# Patient Record
Sex: Male | Born: 1970 | Race: Black or African American | Hispanic: No | Marital: Married | Smoking: Former smoker
Health system: Southern US, Community
[De-identification: ages and names within clinical notes are randomized; demographics above are authoritative.]

## PROBLEM LIST (undated history)

## (undated) DIAGNOSIS — E119 Type 2 diabetes mellitus without complications: Secondary | ICD-10-CM

## (undated) DIAGNOSIS — L0390 Cellulitis, unspecified: Secondary | ICD-10-CM

## (undated) DIAGNOSIS — Z8614 Personal history of Methicillin resistant Staphylococcus aureus infection: Secondary | ICD-10-CM

## (undated) HISTORY — PX: OTHER SURGICAL HISTORY: SHX169

## (undated) HISTORY — PX: KNEE SURGERY: SHX244

## (undated) HISTORY — PX: APPENDECTOMY: SHX54

---

## 2003-06-30 ENCOUNTER — Emergency Department (HOSPITAL_COMMUNITY): Admission: EM | Admit: 2003-06-30 | Discharge: 2003-06-30 | Payer: Self-pay | Admitting: Emergency Medicine

## 2003-10-22 ENCOUNTER — Emergency Department (HOSPITAL_COMMUNITY): Admission: EM | Admit: 2003-10-22 | Discharge: 2003-10-22 | Payer: Self-pay

## 2004-04-04 ENCOUNTER — Emergency Department (HOSPITAL_COMMUNITY): Admission: EM | Admit: 2004-04-04 | Discharge: 2004-04-04 | Payer: Self-pay | Admitting: Emergency Medicine

## 2004-05-18 ENCOUNTER — Emergency Department (HOSPITAL_COMMUNITY): Admission: EM | Admit: 2004-05-18 | Discharge: 2004-05-18 | Payer: Self-pay | Admitting: Emergency Medicine

## 2005-06-02 ENCOUNTER — Emergency Department (HOSPITAL_COMMUNITY): Admission: EM | Admit: 2005-06-02 | Discharge: 2005-06-02 | Payer: Self-pay | Admitting: Emergency Medicine

## 2005-07-12 ENCOUNTER — Emergency Department (HOSPITAL_COMMUNITY): Admission: EM | Admit: 2005-07-12 | Discharge: 2005-07-12 | Payer: Self-pay | Admitting: *Deleted

## 2005-09-02 ENCOUNTER — Emergency Department (HOSPITAL_COMMUNITY): Admission: EM | Admit: 2005-09-02 | Discharge: 2005-09-02 | Payer: Self-pay | Admitting: Emergency Medicine

## 2005-09-05 ENCOUNTER — Emergency Department (HOSPITAL_COMMUNITY): Admission: EM | Admit: 2005-09-05 | Discharge: 2005-09-05 | Payer: Self-pay | Admitting: *Deleted

## 2005-12-17 ENCOUNTER — Emergency Department (HOSPITAL_COMMUNITY): Admission: EM | Admit: 2005-12-17 | Discharge: 2005-12-17 | Payer: Self-pay | Admitting: Emergency Medicine

## 2006-01-29 ENCOUNTER — Emergency Department (HOSPITAL_COMMUNITY): Admission: EM | Admit: 2006-01-29 | Discharge: 2006-01-29 | Payer: Self-pay | Admitting: Emergency Medicine

## 2006-02-21 ENCOUNTER — Emergency Department (HOSPITAL_COMMUNITY): Admission: EM | Admit: 2006-02-21 | Discharge: 2006-02-21 | Payer: Self-pay | Admitting: Emergency Medicine

## 2006-02-21 ENCOUNTER — Emergency Department (HOSPITAL_COMMUNITY): Admission: EM | Admit: 2006-02-21 | Discharge: 2006-02-22 | Payer: Self-pay | Admitting: Emergency Medicine

## 2006-02-22 ENCOUNTER — Emergency Department (HOSPITAL_COMMUNITY): Admission: EM | Admit: 2006-02-22 | Discharge: 2006-02-22 | Payer: Self-pay | Admitting: Emergency Medicine

## 2006-02-23 ENCOUNTER — Encounter: Payer: Self-pay | Admitting: Internal Medicine

## 2006-02-23 ENCOUNTER — Inpatient Hospital Stay (HOSPITAL_COMMUNITY): Admission: EM | Admit: 2006-02-23 | Discharge: 2006-02-26 | Payer: Self-pay | Admitting: Emergency Medicine

## 2006-02-23 ENCOUNTER — Ambulatory Visit: Payer: Self-pay | Admitting: Internal Medicine

## 2008-07-06 ENCOUNTER — Emergency Department (HOSPITAL_COMMUNITY): Admission: EM | Admit: 2008-07-06 | Discharge: 2008-07-06 | Payer: Self-pay | Admitting: Emergency Medicine

## 2010-07-19 ENCOUNTER — Emergency Department (HOSPITAL_COMMUNITY)
Admission: EM | Admit: 2010-07-19 | Discharge: 2010-07-20 | Payer: Self-pay | Source: Home / Self Care | Admitting: Emergency Medicine

## 2010-07-24 ENCOUNTER — Emergency Department (HOSPITAL_COMMUNITY)
Admission: EM | Admit: 2010-07-24 | Discharge: 2010-07-24 | Payer: Self-pay | Source: Home / Self Care | Admitting: Emergency Medicine

## 2010-07-24 LAB — URINE CULTURE
Colony Count: NO GROWTH
Culture  Setup Time: 201201120424
Culture: NO GROWTH

## 2010-07-24 LAB — DIFFERENTIAL
Basophils Absolute: 0 10*3/uL (ref 0.0–0.1)
Basophils Relative: 0 % (ref 0–1)
Eosinophils Absolute: 0.1 10*3/uL (ref 0.0–0.7)
Eosinophils Relative: 1 % (ref 0–5)
Lymphocytes Relative: 30 % (ref 12–46)
Lymphs Abs: 2.6 10*3/uL (ref 0.7–4.0)
Monocytes Absolute: 0.6 10*3/uL (ref 0.1–1.0)
Monocytes Relative: 7 % (ref 3–12)
Neutro Abs: 5.2 10*3/uL (ref 1.7–7.7)
Neutrophils Relative %: 61 % (ref 43–77)

## 2010-07-24 LAB — HEPATIC FUNCTION PANEL
ALT: 36 U/L (ref 0–53)
AST: 34 U/L (ref 0–37)
Albumin: 3.7 g/dL (ref 3.5–5.2)
Alkaline Phosphatase: 90 U/L (ref 39–117)
Bilirubin, Direct: 0.1 mg/dL (ref 0.0–0.3)
Indirect Bilirubin: 0.4 mg/dL (ref 0.3–0.9)
Total Bilirubin: 0.5 mg/dL (ref 0.3–1.2)
Total Protein: 7.9 g/dL (ref 6.0–8.3)

## 2010-07-24 LAB — URINALYSIS, ROUTINE W REFLEX MICROSCOPIC
Bilirubin Urine: NEGATIVE
Hgb urine dipstick: NEGATIVE
Leukocytes, UA: NEGATIVE
Nitrite: NEGATIVE
Protein, ur: 30 mg/dL — AB
Specific Gravity, Urine: 1.036 — ABNORMAL HIGH (ref 1.005–1.030)
Urine Glucose, Fasting: NEGATIVE mg/dL
Urobilinogen, UA: 1 mg/dL (ref 0.0–1.0)
pH: 6 (ref 5.0–8.0)

## 2010-07-24 LAB — URINE MICROSCOPIC-ADD ON

## 2010-07-24 LAB — BASIC METABOLIC PANEL
BUN: 10 mg/dL (ref 6–23)
CO2: 26 mEq/L (ref 19–32)
Calcium: 9.4 mg/dL (ref 8.4–10.5)
Chloride: 100 mEq/L (ref 96–112)
Creatinine, Ser: 0.93 mg/dL (ref 0.4–1.5)
GFR calc Af Amer: >60 mL/min (ref >60)
GFR calc non Af Amer: >60 mL/min (ref >60)
Glucose, Bld: 114 mg/dL — ABNORMAL HIGH (ref 70–99)
Potassium: 3.9 mEq/L (ref 3.5–5.1)
Sodium: 139 mEq/L (ref 135–145)

## 2010-07-24 LAB — PROTIME-INR
INR: 1.02 (ref 0.00–1.49)
Prothrombin Time: 13.6 seconds (ref 11.6–15.2)

## 2010-07-24 LAB — LIPASE, BLOOD: Lipase: 32 U/L (ref 11–59)

## 2010-07-24 LAB — APTT: aPTT: 29 seconds (ref 24–37)

## 2010-07-24 LAB — AMMONIA: Ammonia: 46 umol/L — ABNORMAL HIGH (ref 11–35)

## 2010-07-24 LAB — CBC
HCT: 45.4 % (ref 39.0–52.0)
Hemoglobin: 15.8 g/dL (ref 13.0–17.0)
MCH: 31.9 pg (ref 26.0–34.0)
MCHC: 34.8 g/dL (ref 30.0–36.0)
MCV: 91.5 fL (ref 78.0–100.0)
Platelets: 156 10*3/uL (ref 150–400)
RBC: 4.96 MIL/uL (ref 4.22–5.81)
RDW: 12.5 % (ref 11.5–15.5)
WBC: 8.6 10*3/uL (ref 4.0–10.5)

## 2010-07-31 LAB — WOUND CULTURE

## 2010-07-31 LAB — CHLAMYDIA TRACHOMATIS CULTURE

## 2010-07-31 LAB — GONOCOCCUS CULTURE: Culture: NO GROWTH

## 2010-11-24 NOTE — Discharge Summary (Signed)
NAMEJAVEN, Logan Weiss                 ACCOUNT NO.:  [id number]   MEDICAL RECORD NO.:  [id number]          PATIENT TYPE:  INP   LOCATION:  5016                         FACILITY:  [facility]   PHYSICIAN:  Duncan Dull, M.D.     DATE OF BIRTH:  10-28-1970   DATE OF ADMISSION:  02/23/2006  DATE OF DISCHARGE:  02/26/2006                                 DISCHARGE SUMMARY   ATTENDING PHYSICIAN:  Duncan Dull, M.D.   ADMISSION DATE:  February 23, 2006   DISCHARGE DATE:  February 26, 2006   PRIMARY CARE PHYSICIAN:  None.   DISCHARGE DIAGNOSES:  1. Methicillin sensitive staph aureus cellulitis of the upper lip.  2. Severe periodontal disease.  3. 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  4. Hypokalemia-resolved.  5. Tobacco abuse.  6. Ethanol abuse.  7. Mildly elevated transaminases.   DISCHARGE MEDICATIONS:  1. Cephalexin 500 mg p.o. t.i.d. x10 days.  2. Chlorhexidine 0.12% oral rinse 15 mL swish and spit b.i.d.  3. Bactroban ointment apply to both nares b.i.d. x5 days.  4. Multi vitamin 1 tab p.o. daily.   DISPOSITION AND FOLLOWUP:  At the time of discharge, Logan Weiss lip was  significantly improved.  It was no longer swollen and the previous area of  mild drainage had completely resolved and was covered with a small eschar.  The patient remained afebrile throughout his admission and his white blood  cell count had returned to normal prior to discharge.  The patient is  scheduled to follow up with Dr. Orvan Falconer at the [facility]  Disease clinic on March 12, 2006 at 3:45 p.m.  At that time, his upper  lip will be reassessed.  Additionally, because Logan Weiss has had recurrent  staph infections in the past, a plan for staph prophylaxis and overall  hygiene may be discussed.  Logan Weiss also was strongly encouraged to find a  dentist because of his severe periodontal disease.  Finally, since the  patient was found to have mildly  elevated liver enzymes at the time of  admission, a repeat complete metabolic panel should be performed when he  returns for follow up.   PROCEDURES PERFORMED:  1. Orthopantogram:  This study was performed on February 23, 2006 to      evaluate for potential dental abscesses.  Extensive dental decay and      periodontal disease was noted but no sign of interosseus abscess was      found.  2. CT of the head and maxillofacial areas with and without contrast:  This      study was performed on February 23, 2006 to evaluate for involvement of      deep tissues secondary to the patient's cellulitis.  The study revealed      a normal appearing brain without any focal lesions.  No venous      thrombosis was noted.  Examination of the facial tissues revealed      inflammatory change of the soft tissues of the face, more pronounced on  the left than the right.  No focal fluid collections suggestive of a      drainable abscess were noted.  There was no involvement of the deep      tissues or bony structures.   CONSULTATIONS:  None.   BRIEF HISTORY AND PHYSICAL:  Logan Weiss is a 40 year old man with a history  of recurrent staph aureus skin infections for the last 5 years.  Most  recently, he was treated in July for a MRSA infection on his forehead.  Five  days prior to this admission, the patient began noticing a small pimple on  his upper lip which gradually increased in size.  He was seen at urgent care  where he was started on empiric therapy with Bactrim.  However, over the  following days, this pimple became significantly larger and was associated  with significant pain and swelling of his lip and nose.  On the day prior to  admission, a wound culture was attempted following lancing of the infected  area and was sent to the lab.  The following day, his symptoms had worsened  and he was admitted for IV drug therapy.   PHYSICAL EXAMINATION:  VITAL SIGNS:  Temperature 98.8, blood pressure   129/86, pulse 72, respirations 12, oxygen saturation 98% on room air.  GENERAL:  Physical exam was notable for a well appearing gentleman in no  acute distress.  His pupils were equal, round and reactive to light and  accommodation, extraocular eye movements were intact.  HEENT:  Exam revealed a clear oropharynx.  However, the upper lip was  markedly swollen with an area of purulent drainage along the filtrum just  along the upper lip.  This area was tender with some induration but no frank  abscess was noted.  Examination of the internal aspect of the lip revealed  numerous small pustules bordering the patient's teeth on the internal buccal  mucosa.  Overall, the patient's teeth appeared to be in very poor repair.  NECK:  Examination revealed no lymphadenopathy.  LUNGS:  Clear to auscultation bilaterally.  CARDIOVASCULAR:  Exam revealed a regular rate and rhythm without murmurs,  rubs or gallops.  ABDOMINAL:  Exam was benign.  NEUROLOGICAL:  Exam was grossly non focal.  Cranial nerves 2-12 were intact.   ADMISSION LABS:  White blood cell count 11.0, hemoglobin 15.6, hematocrit  45.5, platelets 180, ANC 8.2, MCV 92.5, sodium 139, potassium 3.4, chloride  99, bi carb 28, BUN 4, creatinine 1.1, glucose 139, total bilirubin 0.9,  direct bilirubin 0.2, indirect bilirubin 0.7, alkaline phosphatase 139, AST  38, ALT 44, total protein 9.2, albumin 3.7.   HOSPITAL COURSE:  1. Cellulitis of the upper lip:  At the time of presentation, the patient      appeared to have marked swelling of the upper lip with an area of      minimal purulent discharge.  Because a wound culture of this site had      been performed the previous day at urgent care, repeat samples were not      obtained.  However, blood cultures were sent to the lab and revealed no      growth after 4 days.  The wound culture revealed methicillin sensitive      staph aureus which was sensitive to several drugs including Avelox,      Keflex and Clindamycin.  Initially the patient was started on empiric      antibiotic therapy, given his history of MRSA,  which included      Vancomycin and Unasyn.  This was continued for 2 days and was then      transitioned to p.o. Avelox when the bacterial sensitivities were      known.  The patient received 2 doses of Avelox.  The imaging studies      noted above revealed primarily an infection of the superficial soft      tissues of the face.  Although severe periodontal disease was noted, no      obvious maxillary abscesses were found.  However, because of the      location of this infection and the risk of cavernous sinus thrombosis,      the patient was monitored closely.  At no point did he have any focal      neurological findings suggesting extension of the infection into the      central nervous system.  At the time of discharge the patient's white      count had fallen to 7.5.  He remained afebrile throughout the admission      and the swelling of his upper lip had essentially completely resolved.      Additionally he was not having any pain at the time of discharge.  He      was given a prescription for Cephalexin 500 mg p.o. t.i.d. to be taken      for 10 days.  This decision was discussed with Dr. Orvan Falconer who will be      seeing the patient for follow up on March 12, 2006.  It was agreed      upon that given the bacterial sensitivities, starting Cephalexin would      have fewer side effects than continuing a 10 day course of Avelox.  2. Severe periodontal disease:  As noted above in the physical exam and      imaging studies, the patient was found to have severe periodontal      disease.  He reports that he has not seen a dentist for many years and      is well aware of his poor dentition.  It is quite likely that his      current infection was at least to some degree due to poor oral hygiene.      While in the hospital, he was started on Chlorhexidine oral rinses and       was provided with a prescription to continue this at home twice a day.      The patient was also strongly encouraged to find a dentist in order to      have his dental disease evaluated and treated more thoroughly.  3. History of recurrent staph infections:  On admission, the patient      reported having skin boils caused by staph aureus for approximately 5      years.  In fact, he had already made an appointment to see Dr. Orvan Falconer      in the infectious disease clinic for this.  Given his history of a      previous MRSA infection, he was initially treated here for MRSA.      However because the pathogen causing his lip cellulitis on this      admission was found to be penicillin sensitive, as noted above, he was      discharged on Cephalexin.  The utility of prolonged antibiotic therapy      to prophylax against recurrent infections is questionable.  I will  defer this to Dr. Orvan Falconer and have already discussed the current     treatment plans with him.  The patient was strongly encouraged to use      an antibacterial soap such as Dial or Lever 2000 daily.  Furthermore,      he was given a prescription for Bactroban ointment to be used in his      nares twice a day for 5 days in order to eradicate any staph bacteria      that may have colonized this area.  4. Hypokalemia:  On admission, the patient was found to be mildly      hypokalemic with a potassium of 3.4.  He received oral potassium      repletion, and his potassium improved quickly thereafter.  However on      the morning of discharge his potassium had again fallen to 3.5 and he      received a single dose of potassium chloride 40 mEq by mouth before      being discharged.  In the future it may be worthwhile to periodically      monitor his electrolytes to insure that he does not become hypokalemic.  5. Elevated liver transaminases and alkaline phosphatase:  On admission      the patient was found to have mildly elevated  AST, ALT and alkaline      phosphatase.  He did not complain of any abdominal pain and his      abdominal exam was completely benign without any hepatosplenomegaly.      This likely represented a transient elevation of liver enzymes, which      could have been due to recent Bactrim therapy.  However a repeat      complete metabolic panel would be advised when he returns to the clinic      for follow up.  6. Tobacco and alcohol abuse:  The patient reports a long smoking history,      although he has recently cut back significantly.  Additionally he      reports that he has consumed considerable amounts of alcohol on the      weekends for some time.  When confronted with the CAGE questions, he      answered positively to 1 out of 4.  However he was strongly encouraged      to limit his alcohol use and to stop smoking.  The patient agreed with      this and was eager to stop using tobacco products.  He was also advised      to take a daily multivitamin.   DISCHARGE LABS:  White blood cell count 7.5, hemoglobin 13.8, hematocrit  39.7, platelets 210, sodium 138, potassium 3.5, chloride 105, bi carb 28,  BUN 8, creatinine 1.0, glucose 113, calcium 9.1.  Blood cultures revealed no  growth to date.  Nasopharyngeal cultures revealed no growth to date after 1  day as well.   DISCHARGE VITALS:  Temperature 96.9, pulse 74, blood pressure 143/81,  respirations 20, oxygen saturation 98% on room air.      Yvonne Kendall, M.D.  Electronically Signed      Duncan Dull, M.D.  Electronically Signed    CE/MEDQ  D:  02/26/2006  T:  02/27/2006  Job:  [id number]   cc:   Cliffton Asters, M.D.  Duncan Dull, M.D.

## 2014-06-05 ENCOUNTER — Ambulatory Visit (INDEPENDENT_AMBULATORY_CARE_PROVIDER_SITE_OTHER): Payer: Self-pay | Admitting: Family Medicine

## 2014-06-05 VITALS — BP 130/72 | HR 79 | Temp 99.2°F | Resp 18 | Ht 66.5 in | Wt 209.2 lb

## 2014-06-05 DIAGNOSIS — Z131 Encounter for screening for diabetes mellitus: Secondary | ICD-10-CM

## 2014-06-05 DIAGNOSIS — L03012 Cellulitis of left finger: Secondary | ICD-10-CM

## 2014-06-05 DIAGNOSIS — Z8614 Personal history of Methicillin resistant Staphylococcus aureus infection: Secondary | ICD-10-CM

## 2014-06-05 DIAGNOSIS — L03119 Cellulitis of unspecified part of limb: Secondary | ICD-10-CM

## 2014-06-05 DIAGNOSIS — E11620 Type 2 diabetes mellitus with diabetic dermatitis: Secondary | ICD-10-CM

## 2014-06-05 LAB — COMPLETE METABOLIC PANEL WITH GFR
ALT: 34 U/L (ref 0–53)
AST: 21 U/L (ref 0–37)
Albumin: 4.2 g/dL (ref 3.5–5.2)
Alkaline Phosphatase: 90 U/L (ref 39–117)
BUN: 7 mg/dL (ref 6–23)
CO2: 23 mEq/L (ref 19–32)
Calcium: 9.1 mg/dL (ref 8.4–10.5)
Chloride: 101 mEq/L (ref 96–112)
Creat: 0.78 mg/dL (ref 0.50–1.35)
GFR, Est African American: >89 mL/min
GFR, Est Non African American: >89 mL/min
Glucose, Bld: 137 mg/dL — ABNORMAL HIGH (ref 70–99)
Potassium: 3.9 mEq/L (ref 3.5–5.3)
Sodium: 138 mEq/L (ref 135–145)
Total Bilirubin: 0.4 mg/dL (ref 0.2–1.2)
Total Protein: 7.8 g/dL (ref 6.0–8.3)

## 2014-06-05 LAB — POCT GLYCOSYLATED HEMOGLOBIN (HGB A1C): Hemoglobin A1C: 6.9

## 2014-06-05 LAB — POCT CBC
Granulocyte percent: 63.6 %G (ref 37–80)
HCT, POC: 47.5 % (ref 43.5–53.7)
Hemoglobin: 15.2 g/dL (ref 14.1–18.1)
Lymph, poc: 2.7 (ref 0.6–3.4)
MCH, POC: 29.5 pg (ref 27–31.2)
MCHC: 32 g/dL (ref 31.8–35.4)
MCV: 92 fL (ref 80–97)
MID (cbc): 0.9 (ref 0–0.9)
MPV: 7.1 fL (ref 0–99.8)
POC Granulocyte: 6.3 (ref 2–6.9)
POC LYMPH PERCENT: 27.5 %L (ref 10–50)
POC MID %: 8.9 % (ref 0–12)
Platelet Count, POC: 134 10*3/uL — AB (ref 142–424)
RBC: 5.16 M/uL (ref 4.69–6.13)
RDW, POC: 14.2 %
WBC: 9.9 10*3/uL (ref 4.6–10.2)

## 2014-06-05 MED ORDER — CEFTRIAXONE SODIUM 1 G IJ SOLR
1.0000 g | Freq: Once | INTRAMUSCULAR | Status: AC
  Administered 2014-06-05: 1 g via INTRAMUSCULAR

## 2014-06-05 MED ORDER — HYDROCODONE-ACETAMINOPHEN 5-325 MG PO TABS: 1.0000 | ORAL_TABLET | Freq: Four times a day (QID) | ORAL | Status: DC | PRN

## 2014-06-05 MED ORDER — METFORMIN HCL 500 MG PO TABS: 500.0000 mg | ORAL_TABLET | Freq: Two times a day (BID) | ORAL | Status: DC

## 2014-06-05 MED ORDER — SULFAMETHOXAZOLE-TRIMETHOPRIM 800-160 MG PO TABS: 1.0000 | ORAL_TABLET | Freq: Two times a day (BID) | ORAL | Status: DC

## 2014-06-05 NOTE — Patient Instructions (Signed)
Cellulitis Cellulitis is an infection of the skin and the tissue beneath it. The infected area is usually red and tender. Cellulitis occurs most often in the arms and lower legs.  CAUSES  Cellulitis is caused by bacteria that enter the skin through cracks or cuts in the skin. The most common types of bacteria that cause cellulitis are staphylococci and streptococci. SIGNS AND SYMPTOMS   Redness and warmth.  Swelling.  Tenderness or pain.  Fever. DIAGNOSIS  Your health care provider can usually determine what is wrong based on a physical exam. Blood tests may also be done. TREATMENT  Treatment usually involves taking an antibiotic medicine. HOME CARE INSTRUCTIONS   Take your antibiotic medicine as directed by your health care provider. Finish the antibiotic even if you start to feel better.  Keep the infected arm or leg elevated to reduce swelling.  Apply a warm cloth to the affected area up to 4 times per day to relieve pain.  Take medicines only as directed by your health care provider.  Keep all follow-up visits as directed by your health care provider. SEEK MEDICAL CARE IF:   You notice red streaks coming from the infected area.  Your red area gets larger or turns dark in color.  Your bone or joint underneath the infected area becomes painful after the skin has healed.  Your infection returns in the same area or another area.  You notice a swollen bump in the infected area.  You develop new symptoms.  You have a fever. SEEK IMMEDIATE MEDICAL CARE IF:   You feel very sleepy.  You develop vomiting or diarrhea.  You have a general ill feeling (malaise) with muscle aches and pains. MAKE SURE YOU:   Understand these instructions.  Will watch your condition.  Will get help right away if you are not doing well or get worse. Document Released: 04/04/2005 Document Revised: 11/09/2013 Document Reviewed: 09/10/2011 [facility] Patient Information 2015 [place], [place].  This information is not intended to replace advice given to you by your health care provider. Make sure you discuss any questions you have with your health care provider. Diabetes Mellitus and Food It is important for you to manage your blood sugar (glucose) level. Your blood glucose level can be greatly affected by what you eat. Eating healthier foods in the appropriate amounts throughout the day at about the same time each day will help you control your blood glucose level. It can also help slow or prevent worsening of your diabetes mellitus. Healthy eating may even help you improve the level of your blood pressure and reach or maintain a healthy weight.  HOW CAN FOOD AFFECT ME? Carbohydrates Carbohydrates affect your blood glucose level more than any other type of food. Your dietitian will help you determine how many carbohydrates to eat at each meal and teach you how to count carbohydrates. Counting carbohydrates is important to keep your blood glucose at a healthy level, especially if you are using insulin or taking certain medicines for diabetes mellitus. Alcohol Alcohol can cause sudden decreases in blood glucose (hypoglycemia), especially if you use insulin or take certain medicines for diabetes mellitus. Hypoglycemia can be a life-threatening condition. Symptoms of hypoglycemia (sleepiness, dizziness, and disorientation) are similar to symptoms of having too much alcohol.  If your health care provider has given you approval to drink alcohol, do so in moderation and use the following guidelines:  Women should not have more than one drink per day, and men should not have more  than two drinks per day. One drink is equal to:  12 oz of beer.  5 oz of wine.  1 oz of hard liquor.  Do not drink on an empty stomach.  Keep yourself hydrated. Have water, diet soda, or unsweetened iced tea.  Regular soda, juice, and other mixers might contain a lot of carbohydrates and should be counted. WHAT  FOODS ARE NOT RECOMMENDED? As you make food choices, it is important to remember that all foods are not the same. Some foods have fewer nutrients per serving than other foods, even though they might have the same number of calories or carbohydrates. It is difficult to get your body what it needs when you eat foods with fewer nutrients. Examples of foods that you should avoid that are high in calories and carbohydrates but low in nutrients include:  Trans fats (most processed foods list trans fats on the Nutrition Facts label).  Regular soda.  Juice.  Candy.  Sweets, such as cake, pie, doughnuts, and cookies.  Fried foods. WHAT FOODS CAN I EAT? Have nutrient-rich foods, which will nourish your body and keep you healthy. The food you should eat also will depend on several factors, including:  The calories you need.  The medicines you take.  Your weight.  Your blood glucose level.  Your blood pressure level.  Your cholesterol level. You also should eat a variety of foods, including:  Protein, such as meat, poultry, fish, tofu, nuts, and seeds (lean animal proteins are best).  Fruits.  Vegetables.  Dairy products, such as milk, cheese, and yogurt (low fat is best).  Breads, grains, pasta, cereal, rice, and beans.  Fats such as olive oil, trans fat-free margarine, canola oil, avocado, and olives. DOES EVERYONE WITH DIABETES MELLITUS HAVE THE SAME MEAL PLAN? Because every person with diabetes mellitus is different, there is not one meal plan that works for everyone. It is very important that you meet with a dietitian who will help you create a meal plan that is just right for you. Document Released: 03/22/2005 Document Revised: 06/30/2013 Document Reviewed: 05/22/2013 [facility] Patient Information 2015 [place], [place]. This information is not intended to replace advice given to you by your health care provider. Make sure you discuss any questions you have with your health care  provider. Diabetes and Standards of Medical Care Diabetes is complicated. You may find that your diabetes team includes a dietitian, nurse, diabetes educator, eye doctor, and more. To help everyone know what is going on and to help you get the care you deserve, the following schedule of care was developed to help keep you on track. Below are the tests, exams, vaccines, medicines, education, and plans you will need. HbA1c test This test shows how well you have controlled your glucose over the past 2-3 months. It is used to see if your diabetes management plan needs to be adjusted.   It is performed at least 2 times a year if you are meeting treatment goals.  It is performed 4 times a year if therapy has changed or if you are not meeting treatment goals. Blood pressure test  This test is performed at every routine medical visit. The goal is less than 140/90 mm Hg for most people, but 130/80 mm Hg in some cases. Ask your health care provider about your goal. Dental exam  Follow up with the dentist regularly. Eye exam  If you are diagnosed with type 1 diabetes as a child, get an exam upon reaching the age  of 10 years or older and have had diabetes for 3-5 years. Yearly eye exams are recommended after that initial eye exam.  If you are diagnosed with type 1 diabetes as an adult, get an exam within 5 years of diagnosis and then yearly.  If you are diagnosed with type 2 diabetes, get an exam as soon as possible after the diagnosis and then yearly. Foot care exam  Visual foot exams are performed at every routine medical visit. The exams check for cuts, injuries, or other problems with the feet.  A comprehensive foot exam should be done yearly. This includes visual inspection as well as assessing foot pulses and testing for loss of sensation.  Check your feet nightly for cuts, injuries, or other problems with your feet. Tell your health care provider if anything is not healing. Kidney function  test (urine microalbumin)  This test is performed once a year.  Type 1 diabetes: The first test is performed 5 years after diagnosis.  Type 2 diabetes: The first test is performed at the time of diagnosis.  A serum creatinine and estimated glomerular filtration rate (eGFR) test is done once a year to assess the level of chronic kidney disease (CKD), if present. Lipid profile (cholesterol, HDL, LDL, triglycerides)  Performed every 5 years for most people.  The goal for LDL is less than 100 mg/dL. If you are at high risk, the goal is less than 70 mg/dL.  The goal for HDL is 40 mg/dL-50 mg/dL for men and 50 mg/dL-60 mg/dL for women. An HDL cholesterol of 60 mg/dL or higher gives some protection against heart disease.  The goal for triglycerides is less than 150 mg/dL. Influenza vaccine, pneumococcal vaccine, and hepatitis B vaccine  The influenza vaccine is recommended yearly.  It is recommended that people with diabetes who are over 72 years old get the pneumonia vaccine. In some cases, two separate shots may be given. Ask your health care provider if your pneumonia vaccination is up to date.  The hepatitis B vaccine is also recommended for adults with diabetes. Diabetes self-management education  Education is recommended at diagnosis and ongoing as needed. Treatment plan  Your treatment plan is reviewed at every medical visit. Document Released: 04/22/2009 Document Revised: 11/09/2013 Document Reviewed: 11/25/2012 [facility] Patient Information 2015 [place], [place]. This information is not intended to replace advice given to you by your health care provider. Make sure you discuss any questions you have with your health care provider.

## 2014-06-05 NOTE — Progress Notes (Signed)
Chief Complaint:  Chief Complaint  Patient presents with  . Swollen Hand    Left    HPI: Logan Weiss is a 43 y.o. male who is here for  3 week hx of left hand pain and swelling, started off as small area of pain and swelling, it was Tmax was 102 yesterday and has been taking tylenol, ibuprofen without releif. NKI, he is not diabetic. He works at [employer]. 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 HE did not make too much of it since there were no rashes or open wounds. He has a hx of MRSA--not sure if he ahs a rx to doxycyline, know he has been on it and was not effective an dhad to be on IV abs, he has taken bactrim before without issues, he has no kidney issues. Currenlty no illicit drugs.   History reviewed. No pertinent past medical history. History reviewed. No pertinent past surgical history. History   Social History  . Marital Status: Single    Spouse Name: N/A    Number of Children: N/A  . Years of Education: N/A   Social History Main Topics  . Smoking status: Current Every Day Smoker  . Smokeless tobacco: Never Used  . Alcohol Use: No  . Drug Use: No  . Sexual Activity: None   Other Topics Concern  . None   Social History Narrative  . None   Family History  Problem Relation Age of Onset  . Hyperlipidemia Mother   . Mental retardation Mother   . Diabetes Father   . Hyperlipidemia Father    No Known Allergies Prior to Admission medications   Not on File     ROS: The patient denies night sweats, unintentional weight loss, chest pain, palpitations, wheezing, dyspnea on exertion, nausea, vomiting, abdominal pain, dysuria, hematuria, melena, numbness, weakness, or tingling.   All other systems have been reviewed and were otherwise negative with the exception of those mentioned in the HPI and as above.    PHYSICAL EXAM: Filed Vitals:   06/05/14 1048  BP: 130/72  Pulse: 79  Temp: 99.2 F (37.3 C)  Resp: 18   Filed  Vitals:   06/05/14 1048  Height: 5' 6.5" (1.689 m)  Weight: 209 lb 4 oz (94.915 kg)   Body mass index is 33.27 kg/(m^2).  General: Alert, no acute distress HEENT:  Normocephalic, atraumatic, oropharynx patent. EOMI, PERRLA Cardiovascular:  Regular rate and rhythm, no rubs murmurs or gallops.  Radial pulse intact. No pedal edema.  Respiratory: Clear to auscultation bilaterally.  No wheezes, rales, or rhonchi.  No cyanosis, no use of accessory musculature GI: No organomegaly, abdomen is soft and non-tender, positive bowel sounds.  No masses. Skin: + cellulitis and swelling, he has good grip but hurts for him to grip, minimal warmth, no lesions, cracks or puncture wounds Neurologic: Facial musculature symmetric. Psychiatric: Patient is appropriate throughout our interaction. Lymphatic: No cervical lymphadenopathy Musculoskeletal: Gait intact.   LABS: Results for orders placed or performed in visit on 06/05/14  COMPLETE METABOLIC PANEL WITH GFR  Result Value Ref Range   Sodium 138 135 - 145 mEq/L   Potassium 3.9 3.5 - 5.3 mEq/L   Chloride 101 96 - 112 mEq/L   CO2 23 19 - 32 mEq/L   Glucose, Bld 137 (H) 70 - 99 mg/dL   BUN 7 6 - 23 mg/dL   Creat 9.140.78 7.820.50 - 9.561.35 mg/dL  Total Bilirubin 0.4 0.2 - 1.2 mg/dL   Alkaline Phosphatase 90 39 - 117 U/L   AST 21 0 - 37 U/L   ALT 34 0 - 53 U/L   Total Protein 7.8 6.0 - 8.3 g/dL   Albumin 4.2 3.5 - 5.2 g/dL   Calcium 9.1 8.4 - 40.910.5 mg/dL   GFR, Est African American >89 mL/min   GFR, Est Non African American >89 mL/min  POCT CBC  Result Value Ref Range   WBC 9.9 4.6 - 10.2 K/uL   Lymph, poc 2.7 0.6 - 3.4   POC LYMPH PERCENT 27.5 10 - 50 %L   MID (cbc) 0.9 0 - 0.9   POC MID % 8.9 0 - 12 %M   POC Granulocyte 6.3 2 - 6.9   Granulocyte percent 63.6 37 - 80 %G   RBC 5.16 4.69 - 6.13 M/uL   Hemoglobin 15.2 14.1 - 18.1 g/dL   HCT, POC 81.147.5 91.443.5 - 53.7 %   MCV 92.0 80 - 97 fL   MCH, POC 29.5 27 - 31.2 pg   MCHC 32.0 31.8 - 35.4 g/dL    RDW, POC 78.214.2 %   Platelet Count, POC 134 (A) 142 - 424 K/uL   MPV 7.1 0 - 99.8 fL  POCT glycosylated hemoglobin (Hb A1C)  Result Value Ref Range   Hemoglobin A1C 6.9      EKG/XRAY:   [facility]   ASSESSMENT/PLAN: Encounter Diagnoses  Name Primary?  . Cellulitis of finger of left hand Yes  . Cellulitis of hand   . Screening for diabetes mellitus   . Type 2 diabetes mellitus with diabetic dermatitis   . History of MRSA infection     43 y/o AA male with a hx of MRSA in the past, newly dx diabetes.  He was given a rocephin IM injection and also Bactrim DS Norco for pain control  He states he has had both and the doxyccyline did not work for him, I suspect it was too far into the infection for the oral pills to work so he required IV abx in the hospital HE will be started on metformin 500 mg BID  CMP pending F/u in 1 month, precautions given to go to ER prn for wrosening sxs  Gross sideeffects, risk and benefits, and alternatives of medications d/w patient. Patient is aware that all medications have potential sideeffects and we are unable to predict every sideeffect or drug-drug interaction that may occur.  Hamilton CapriLE, Annslee Tercero PHUONG, DO 06/08/2014 12:37 PM

## 2014-06-08 DIAGNOSIS — Z8614 Personal history of Methicillin resistant Staphylococcus aureus infection: Secondary | ICD-10-CM | POA: Insufficient documentation

## 2014-06-08 DIAGNOSIS — E11620 Type 2 diabetes mellitus with diabetic dermatitis: Secondary | ICD-10-CM | POA: Insufficient documentation

## 2014-06-09 ENCOUNTER — Telehealth: Payer: Self-pay

## 2014-06-09 NOTE — Telephone Encounter (Signed)
Pt of Dr. Conley RollsLe needs a Dr. Note extended for his swollen hand, please advise

## 2014-06-10 NOTE — Telephone Encounter (Signed)
Patient requesting a modified OOW note through next Wednesday.    [phone]

## 2014-06-14 NOTE — Telephone Encounter (Signed)
LM for pt to rtn call and give us an update.  Letter has been written and in pick up drawer. Pt has been advised it is ready.

## 2014-06-14 NOTE — Telephone Encounter (Signed)
Extension is ok but is his hand better. That is what I am more worried about. IF not improved on abx thenhe needs to [facility]

## 2014-06-14 NOTE — Telephone Encounter (Signed)
Dr. Conley RollsLe please advise on this OOW extension.

## 2014-06-15 ENCOUNTER — Encounter: Payer: Self-pay | Admitting: Family Medicine

## 2014-06-22 ENCOUNTER — Encounter: Payer: Self-pay | Admitting: Internal Medicine

## 2014-06-22 ENCOUNTER — Encounter (HOSPITAL_COMMUNITY): Payer: Self-pay | Admitting: *Deleted

## 2014-06-22 ENCOUNTER — Ambulatory Visit: Payer: Self-pay | Attending: Internal Medicine | Admitting: Internal Medicine

## 2014-06-22 ENCOUNTER — Emergency Department (HOSPITAL_COMMUNITY)
Admission: EM | Admit: 2014-06-22 | Discharge: 2014-06-23 | Disposition: A | Discharge status: Home / Self Care | Payer: Self-pay | Attending: Emergency Medicine | Admitting: Emergency Medicine

## 2014-06-22 ENCOUNTER — Emergency Department (HOSPITAL_COMMUNITY): Payer: Self-pay

## 2014-06-22 VITALS — BP 134/82 | HR 101 | Temp 98.0°F | Resp 16 | Wt 209.0 lb

## 2014-06-22 DIAGNOSIS — Z79899 Other long term (current) drug therapy: Secondary | ICD-10-CM | POA: Insufficient documentation

## 2014-06-22 DIAGNOSIS — L03114 Cellulitis of left upper limb: Secondary | ICD-10-CM | POA: Insufficient documentation

## 2014-06-22 DIAGNOSIS — Z8249 Family history of ischemic heart disease and other diseases of the circulatory system: Secondary | ICD-10-CM | POA: Insufficient documentation

## 2014-06-22 DIAGNOSIS — M7989 Other specified soft tissue disorders: Secondary | ICD-10-CM | POA: Insufficient documentation

## 2014-06-22 DIAGNOSIS — Z87891 Personal history of nicotine dependence: Secondary | ICD-10-CM | POA: Insufficient documentation

## 2014-06-22 DIAGNOSIS — Z8614 Personal history of Methicillin resistant Staphylococcus aureus infection: Secondary | ICD-10-CM | POA: Insufficient documentation

## 2014-06-22 DIAGNOSIS — E139 Other specified diabetes mellitus without complications: Secondary | ICD-10-CM | POA: Insufficient documentation

## 2014-06-22 DIAGNOSIS — R609 Edema, unspecified: Secondary | ICD-10-CM

## 2014-06-22 DIAGNOSIS — L03012 Cellulitis of left finger: Secondary | ICD-10-CM

## 2014-06-22 DIAGNOSIS — E119 Type 2 diabetes mellitus without complications: Secondary | ICD-10-CM | POA: Insufficient documentation

## 2014-06-22 DIAGNOSIS — Z833 Family history of diabetes mellitus: Secondary | ICD-10-CM | POA: Insufficient documentation

## 2014-06-22 HISTORY — DX: Type 2 diabetes mellitus without complications: E11.9

## 2014-06-22 LAB — CBC WITH DIFFERENTIAL/PLATELET
Basophils Absolute: 0 10*3/uL (ref 0.0–0.1)
Basophils Relative: 1 % (ref 0–1)
Eosinophils Absolute: 0.2 10*3/uL (ref 0.0–0.7)
Eosinophils Relative: 4 % (ref 0–5)
HCT: 44.1 % (ref 39.0–52.0)
Hemoglobin: 14.7 g/dL (ref 13.0–17.0)
LYMPHS ABS: 2.9 10*3/uL (ref 0.7–4.0)
LYMPHS PCT: 47 % — AB (ref 12–46)
MCH: 29.4 pg (ref 26.0–34.0)
MCHC: 33.3 g/dL (ref 30.0–36.0)
MCV: 88.2 fL (ref 78.0–100.0)
Monocytes Absolute: 0.3 10*3/uL (ref 0.1–1.0)
Monocytes Relative: 4 % (ref 3–12)
NEUTROS ABS: 2.7 10*3/uL (ref 1.7–7.7)
NEUTROS PCT: 44 % (ref 43–77)
PLATELETS: 185 10*3/uL (ref 150–400)
RBC: 5 MIL/uL (ref 4.22–5.81)
RDW: 12.6 % (ref 11.5–15.5)
WBC: 6.1 10*3/uL (ref 4.0–10.5)

## 2014-06-22 LAB — BASIC METABOLIC PANEL
ANION GAP: 16 — AB (ref 5–15)
BUN: 8 mg/dL (ref 6–23)
CALCIUM: 9.6 mg/dL (ref 8.4–10.5)
CO2: 24 mEq/L (ref 19–32)
Chloride: 102 mEq/L (ref 96–112)
Creatinine, Ser: 0.73 mg/dL (ref 0.50–1.35)
Glucose, Bld: 79 mg/dL (ref 70–99)
POTASSIUM: 3.6 meq/L — AB (ref 3.7–5.3)
Sodium: 142 mEq/L (ref 137–147)

## 2014-06-22 LAB — URIC ACID: URIC ACID, SERUM: 5 mg/dL (ref 4.0–7.8)

## 2014-06-22 LAB — C-REACTIVE PROTEIN

## 2014-06-22 LAB — GLUCOSE, POCT (MANUAL RESULT ENTRY): POC Glucose: 111 mg/dl — AB (ref 70–99)

## 2014-06-22 LAB — SEDIMENTATION RATE: SED RATE: 10 mm/h (ref 0–16)

## 2014-06-22 MED ORDER — FREESTYLE SYSTEM KIT: 1.0000 | PACK | Status: DC | PRN

## 2014-06-22 MED ORDER — OXYCODONE-ACETAMINOPHEN 5-325 MG PO TABS
1.0000 | ORAL_TABLET | Freq: Once | ORAL | Status: DC
  Filled 2014-06-22: qty 1

## 2014-06-22 MED ORDER — BETAMETHASONE SOD PHOS & ACET 6 (3-3) MG/ML IJ SUSP
12.0000 mg | Freq: Once | INTRAMUSCULAR | Status: DC
  Filled 2014-06-22 (×3): qty 2

## 2014-06-22 MED ORDER — LIDOCAINE HCL (PF) 1 % IJ SOLN
5.0000 mL | Freq: Once | INTRAMUSCULAR | Status: AC
Start: 2014-06-22 — End: 2014-06-22
  Administered 2014-06-22: 5 mL via INTRADERMAL
  Filled 2014-06-22: qty 5

## 2014-06-22 MED ORDER — INDOMETHACIN ER 75 MG PO CPCR
75.0000 mg | ORAL_CAPSULE | Freq: Once | ORAL | Status: AC
  Administered 2014-06-23: 75 mg via ORAL
  Filled 2014-06-22: qty 1

## 2014-06-22 MED ORDER — GLUCOCOM LANCETS 28G MISC: Status: AC

## 2014-06-22 MED ORDER — KETOROLAC TROMETHAMINE 60 MG/2ML IM SOLN
30.0000 mg | Freq: Once | INTRAMUSCULAR | Status: AC
  Administered 2014-06-22: 30 mg via INTRAMUSCULAR
  Filled 2014-06-22: qty 2

## 2014-06-22 MED ORDER — SULFAMETHOXAZOLE-TRIMETHOPRIM 800-160 MG PO TABS
1.0000 | ORAL_TABLET | Freq: Once | ORAL | Status: AC
  Administered 2014-06-23: 1 via ORAL
  Filled 2014-06-22: qty 1

## 2014-06-22 MED ORDER — GLUCOSE BLOOD VI STRP: ORAL_STRIP | Status: AC

## 2014-06-22 NOTE — ED Provider Notes (Signed)
CSN: [id number]     Arrival date & time 06/22/14  1228 History   First MD Initiated Contact with Patient 06/22/14 1530     Chief Complaint  Patient presents with  . Hand Pain    Logan Weiss is a 43 y.o. male with a history of diabetes and MRSA infection presents to emergency complaining of one month of left index finger swelling and pain. Patient's finger is swollen from his knuckle to his fingertip. Denies trauma to his finger or hand. The patient reports about a month ago noticed swelling in his index finger that increased to involve his entire hand. Patient was started on bactrim and his hand swelling improved but his finger swelling continued. After completing the course of Bactrim he continued finger swelling. Patient reports that 6 days ago he was reevaluated and started on doxycycline. He has noticed no improvement since starting doxycycline. Patient rates his pain at 5 out of 10 at its worse with movement. Patient has been taking Norco with minimal relief. 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 Patient reports his diabetes has been well controlled. Patient denies redness, drainage or pus. Patient denies fevers, chills, numbness, tingling, abdominal pain, nausea, vomiting, rashes, chest pain or shortness of breath.  (Consider location/radiation/quality/duration/timing/severity/associated sxs/prior Treatment) HPI  Past Medical History  Diagnosis Date  . Diabetes     new dx   Past Surgical History  Procedure Laterality Date  . Appendectomy    . Right knee surgery     Family History  Problem Relation Age of Onset  . Hyperlipidemia Mother   . Mental retardation Mother   . Diabetes Mother   . Diabetes Father   . Hyperlipidemia Father   . Hypertension Father   . Diabetes Maternal Grandmother   . Diabetes Maternal Grandfather   . Diabetes Paternal Grandmother   . Diabetes Paternal Grandfather    History  Substance Use Topics  . Smoking status: Former Smoker  -- 0.00 packs/day for 5 years  . Smokeless tobacco: Never Used  . Alcohol Use: No     Comment: socially    Review of Systems  Constitutional: Negative for fever, chills and appetite change.  HENT: Negative for congestion, ear pain, sore throat and trouble swallowing.   Eyes: Negative for visual disturbance.  Respiratory: Negative for cough, shortness of breath and wheezing.   Cardiovascular: Negative for chest pain, palpitations and leg swelling.  Gastrointestinal: Negative for nausea, vomiting, abdominal pain and diarrhea.  Genitourinary: Negative for dysuria.  Musculoskeletal: Positive for joint swelling. Negative for back pain and neck pain.  Skin: Positive for color change. Negative for rash and wound.  Neurological: Negative for weakness, numbness and headaches.  All other systems reviewed and are negative.     Allergies  Review of patient's allergies indicates no known allergies.  Home Medications   Prior to Admission medications   Medication Sig Start Date End Date Taking? Authorizing Provider  doxycycline (VIBRA-TABS) 100 MG tablet Take 100 mg by mouth 2 (two) times daily.   Yes Historical Provider, MD  furosemide (LASIX) 20 MG tablet Take 20 mg by mouth daily.   Yes Historical Provider, MD  GlucoCom Lancets MISC Check blood sugar TID & QHS 06/22/14  Yes Deepak Advani, MD  glucose blood (CHOICE DM FORA G20 TEST STRIPS) test strip Use as instructed 06/22/14  Yes Deepak Advani, MD  glucose monitoring kit (FREESTYLE) monitoring kit 1 each by Does not apply route as needed for  other. 06/22/14  Yes Lorayne Marek, MD  HYDROcodone-acetaminophen (NORCO) 10-325 MG per tablet Take 1 tablet by mouth every 6 (six) hours as needed.   Yes Historical Provider, MD  Ibuprofen-Diphenhydramine Cit (ADVIL PM PO) Take 2 capsules by mouth every 4 (four) hours as needed (pain).   Yes Historical Provider, MD  metFORMIN (GLUCOPHAGE) 500 MG tablet Take 1 tablet (500 mg total) by mouth 2 (two) times  daily with a meal. 06/05/14  Yes Thao P Le, DO  HYDROcodone-acetaminophen (NORCO) 5-325 MG per tablet Take 1 tablet by mouth every 6 (six) hours as needed for moderate pain. May cause constipation, take stool softener; no tylenol with this Patient not taking: Reported on 06/22/2014 06/05/14   Thao P Le, DO  sulfamethoxazole-trimethoprim (BACTRIM DS,SEPTRA DS) 800-160 MG per tablet Take 1 tablet by mouth 2 (two) times daily. Patient not taking: Reported on 06/22/2014 06/05/14   Thao P Le, DO   BP 108/69 mmHg  Pulse 59  Temp(Src) 98.2 F (36.8 C) (Oral)  Resp 16  Ht _0  (1.676 m)  Wt 208 lb 7 oz (94.547 kg)  BMI 33.66 kg/m2  SpO2 98% Physical Exam  Constitutional: He appears well-developed and well-nourished. No distress.  HENT:  Head: Normocephalic and atraumatic.  Right Ear: External ear normal.  Left Ear: External ear normal.  Mouth/Throat: Oropharynx is clear and moist. No oropharyngeal exudate.  Eyes: Conjunctivae are normal. Pupils are equal, round, and reactive to light. Right eye exhibits no discharge. Left eye exhibits no discharge.  Neck: Neck supple.  Cardiovascular: Normal rate, regular rhythm, normal heart sounds and intact distal pulses.  Exam reveals no gallop and no friction rub.   No murmur heard. Bilateral radial pulses are intact.  Pulmonary/Chest: Effort normal and breath sounds normal. No respiratory distress. He has no wheezes. He has no rales.  Abdominal: Soft. There is no tenderness.  Musculoskeletal: He exhibits edema and tenderness.  The patient has edema from his left digit #2 MCP to his fingertip. There is no erythema or evidence of swelling. The patient's finger is very stiff. I am unable to move his MCP, PIP, or DIP fully. The patient has left thenar atrophy. Capillary refill is less than 2 seconds. The patient's sensation is intact.  Lymphadenopathy:    He has no cervical adenopathy.  Neurological: He is alert. Coordination normal.  Skin: Skin is warm  and dry. No rash noted. He is not diaphoretic. No erythema. No pallor.  There is no erythema noted to the patient's left index finger.  Psychiatric: He has a normal mood and affect. His behavior is normal.  Nursing note and vitals reviewed.   ED Course  Procedures (including critical care time) Labs Review Labs Reviewed  BASIC METABOLIC PANEL - Abnormal; Notable for the following:    Potassium 3.6 (*)    Anion gap 16 (*)    All other components within normal limits  CBC WITH DIFFERENTIAL - Abnormal; Notable for the following:    Lymphocytes Relative 47 (*)    All other components within normal limits  URIC ACID  C-REACTIVE PROTEIN    Imaging Review Dg Hand Complete Left  06/22/2014   CLINICAL DATA:  Swelling of the left index finger. Started 1 month ago.  EXAM: LEFT HAND - COMPLETE 3+ VIEW  COMPARISON:  None.  FINDINGS: There is no evidence of fracture or dislocation. There is no evidence of arthropathy or other focal bone abnormality. Severe soft tissue swelling of the left second digit.  IMPRESSION: Severe soft tissue swelling of the left second digit without underlying osseous abnormality.   Electronically Signed   By: Kathreen Devoid   On: 06/22/2014 14:57     EKG Interpretation None      Filed Vitals:   06/22/14 1645 06/22/14 1700 06/22/14 1730 06/22/14 1800  BP: 117/72 128/82 108/75 108/69  Pulse: 67 69 70 59  Temp:    98.2 F (36.8 C)  TempSrc:    Oral  Resp:    16  Height:      Weight:      SpO2: 100% 100% 99% 98%           MDM   Final diagnoses:  Finger swelling   Logan Weiss is a 43 y.o. male with a history of diabetes and MRSA infection presents to emergency complaining of one month of left index finger swelling and pain. Patient's finger is swollen from his knuckle to his fingertip. Patient has completed a course of Bactrim. The patient is currently on his sixth day of doxycycline. Please see above pictures. Patient's finger is very swollen but has no  erythema. The patient's finger does not seem to be infected. Patient's finger is very stiff. Patient has thenar atrophy. Patient is afebrile and nontoxic appearing. The patient's x-ray shows no osseous abnormality.  Dr. Eulis Foster consulted hand surgeon Dr. Amedeo Plenty who evaluated the patient and would like a MRI of his hand. The patient's BMP and CBC are unremarkable. Patient's uric acid is normal. CRP is pending. Dr. Amedeo Plenty will evaluate further after his MRI.  This patient was discussed with and evaluated by Dr. Eulis Foster agrees with assessment and plan. Patient care was handed off to Dr. Leonides Schanz and Clayton Bibles, PA-C at shift change.       Hanley Hays, PA-C 06/22/14 [place], MD 06/23/14 [phone]

## 2014-06-22 NOTE — ED Notes (Signed)
Patient refusing labs at this time. RN sent in to discuss need for additional labs

## 2014-06-22 NOTE — ED Provider Notes (Addendum)
12:00 AM  Assumed care.  Pt is a 43 y.o. M here with left first finger swelling. Seen by Dr. Amedeo Plenty with hand surgery.  Labs unremarkable. No leukocytosis. Normal ESR and CRP. No underlying osseous injury seen on x-ray. Dr. Amedeo Plenty has reviewed pt's MRI and sees no obvious abscess. He does not feel the patient needs to go to the operating room at this time given his normal labs as he feels infectious tenosynovitis less likely. He would like for me to discharge the patient with Bactrim twice a day for 3 weeks and indomethacin 75 mg extended release. He would like to see the patient on Thursday morning at 8 AM and 2 days. Have discussed this with patient and mother at bedside. They verbalize understanding.  Discussed return precautions.  [place], DO 06/22/14 [place], DO 06/22/14 2358

## 2014-06-22 NOTE — Patient Instructions (Signed)
Diabetes Mellitus and Food It is important for you to manage your blood sugar (glucose) level. Your blood glucose level can be greatly affected by what you eat. Eating healthier foods in the appropriate amounts throughout the day at about the same time each day will help you control your blood glucose level. It can also help slow or prevent worsening of your diabetes mellitus. Healthy eating may even help you improve the level of your blood pressure and reach or maintain a healthy weight.  HOW CAN FOOD AFFECT ME? Carbohydrates Carbohydrates affect your blood glucose level more than any other type of food. Your dietitian will help you determine how many carbohydrates to eat at each meal and teach you how to count carbohydrates. Counting carbohydrates is important to keep your blood glucose at a healthy level, especially if you are using insulin or taking certain medicines for diabetes mellitus. Alcohol Alcohol can cause sudden decreases in blood glucose (hypoglycemia), especially if you use insulin or take certain medicines for diabetes mellitus. Hypoglycemia can be a life-threatening condition. Symptoms of hypoglycemia (sleepiness, dizziness, and disorientation) are similar to symptoms of having too much alcohol.  If your health care provider has given you approval to drink alcohol, do so in moderation and use the following guidelines:  Women should not have more than one drink per day, and men should not have more than two drinks per day. One drink is equal to:  12 oz of beer.  5 oz of wine.  1 oz of hard liquor.  Do not drink on an empty stomach.  Keep yourself hydrated. Have water, diet soda, or unsweetened iced tea.  Regular soda, juice, and other mixers might contain a lot of carbohydrates and should be counted. WHAT FOODS ARE NOT RECOMMENDED? As you make food choices, it is important to remember that all foods are not the same. Some foods have fewer nutrients per serving than other  foods, even though they might have the same number of calories or carbohydrates. It is difficult to get your body what it needs when you eat foods with fewer nutrients. Examples of foods that you should avoid that are high in calories and carbohydrates but low in nutrients include:  Trans fats (most processed foods list trans fats on the Nutrition Facts label).  Regular soda.  Juice.  Candy.  Sweets, such as cake, pie, doughnuts, and cookies.  Fried foods. WHAT FOODS CAN I EAT? Have nutrient-rich foods, which will nourish your body and keep you healthy. The food you should eat also will depend on several factors, including:  The calories you need.  The medicines you take.  Your weight.  Your blood glucose level.  Your blood pressure level.  Your cholesterol level. You also should eat a variety of foods, including:  Protein, such as meat, poultry, fish, tofu, nuts, and seeds (lean animal proteins are best).  Fruits.  Vegetables.  Dairy products, such as milk, cheese, and yogurt (low fat is best).  Breads, grains, pasta, cereal, rice, and beans.  Fats such as olive oil, trans fat-free margarine, canola oil, avocado, and olives. DOES EVERYONE WITH DIABETES MELLITUS HAVE THE SAME MEAL PLAN? Because every person with diabetes mellitus is different, there is not one meal plan that works for everyone. It is very important that you meet with a dietitian who will help you create a meal plan that is just right for you. Document Released: 03/22/2005 Document Revised: 06/30/2013 Document Reviewed: 05/22/2013 [employer] Patient Information 2015 [employer]. This   information is not intended to replace advice given to you by your health care provider. Make sure you discuss any questions you have with your health care provider.  

## 2014-06-22 NOTE — Consult Note (Signed)
Reason for Consult:left hand / index finger swelling Referring Physician: [name] is an 43 y.o. male.  HPI: Pleasant 43 year old male who looks older than his stated age with history of diabetes who is had swelling in his hand for a month. He is been placed on Bactrim as well as doxycycline the urgent cares on [employer] on 2 separate occasions. He was seen at [facility] urgent care today and referred to the main ER for evaluation. The patient states that his hand was severely swollen but it has now gone on to rest in just the index finger. He denies history of gout or pseudogout.  At present time he is been seen by the emergency room staff. I recommended labs and an MRI scan.  His x-rays are indeterminate for any obvious malady. Bony architecture is normal cartilaginous space is well-maintained.  The patient has no evidence of abscess in the hand. His index finger is swollen to a certain extent he's not been able to move this for quite some time.  We have reviewed this at length and his findings. He denies fever chills nausea or vomiting. I do not have all the notes from [place] urgent care.  I have tried to review his chart at great length.  Past Medical History  Diagnosis Date  . Diabetes     new dx    Past Surgical History  Procedure Laterality Date  . Appendectomy    . Right knee surgery      Family History  Problem Relation Age of Onset  . Hyperlipidemia Mother   . Mental retardation Mother   . Diabetes Mother   . Diabetes Father   . Hyperlipidemia Father   . Hypertension Father   . Diabetes Maternal Grandmother   . Diabetes Maternal Grandfather   . Diabetes Paternal Grandmother   . Diabetes Paternal Grandfather     Social History:  reports that he has quit smoking. He has never used smokeless tobacco. He reports that he does not drink alcohol or use illicit drugs.  Allergies: No Known Allergies  Medications: I have reviewed the patient's current  medications.  Results for orders placed or performed during the hospital encounter of 06/22/14 (from the past 48 hour(s))  Basic metabolic panel     Status: Abnormal   Collection Time: 06/22/14  5:07 PM  Result Value Ref Range   Sodium 142 137 - 147 mEq/L   Potassium 3.6 (L) 3.7 - 5.3 mEq/L   Chloride 102 96 - 112 mEq/L   CO2 24 19 - 32 mEq/L   Glucose, Bld 79 70 - 99 mg/dL   BUN 8 6 - 23 mg/dL   Creatinine, Ser 0.73 0.50 - 1.35 mg/dL   Calcium 9.6 8.4 - 10.5 mg/dL   GFR calc non Af Amer >90 >90 mL/min   GFR calc Af Amer >90 >90 mL/min    Comment: (NOTE) The eGFR has been calculated using the CKD EPI equation. This calculation has not been validated in all clinical situations. eGFR's persistently <90 mL/min signify possible Chronic Kidney Disease.    Anion gap 16 (H) 5 - 15  CBC with Differential     Status: Abnormal   Collection Time: 06/22/14  5:07 PM  Result Value Ref Range   WBC 6.1 4.0 - 10.5 K/uL   RBC 5.00 4.22 - 5.81 MIL/uL   Hemoglobin 14.7 13.0 - 17.0 g/dL   HCT 44.1 39.0 - 52.0 %   MCV 88.2 78.0 -  100.0 fL   MCH 29.4 26.0 - 34.0 pg   MCHC 33.3 30.0 - 36.0 g/dL   RDW 12.6 11.5 - 15.5 %   Platelets 185 150 - 400 K/uL   Neutrophils Relative % 44 43 - 77 %   Neutro Abs 2.7 1.7 - 7.7 K/uL   Lymphocytes Relative 47 (H) 12 - 46 %   Lymphs Abs 2.9 0.7 - 4.0 K/uL   Monocytes Relative 4 3 - 12 %   Monocytes Absolute 0.3 0.1 - 1.0 K/uL   Eosinophils Relative 4 0 - 5 %   Eosinophils Absolute 0.2 0.0 - 0.7 K/uL   Basophils Relative 1 0 - 1 %   Basophils Absolute 0.0 0.0 - 0.1 K/uL  Uric acid     Status: None   Collection Time: 06/22/14  5:07 PM  Result Value Ref Range   Uric Acid, Serum 5.0 4.0 - 7.8 mg/dL    Dg Hand Complete Left  06/22/2014   CLINICAL DATA:  Swelling of the left index finger. Started 1 month ago.  EXAM: LEFT HAND - COMPLETE 3+ VIEW  COMPARISON:  None.  FINDINGS: There is no evidence of fracture or dislocation. There is no evidence of arthropathy  or other focal bone abnormality. Severe soft tissue swelling of the left second digit.  IMPRESSION: Severe soft tissue swelling of the left second digit without underlying osseous abnormality.   Electronically Signed   By: Kathreen Devoid   On: 06/22/2014 14:57    Review of Systems  Constitutional: Negative.   Eyes: Negative.   Respiratory: Negative.   Genitourinary: Negative.   Neurological: Negative.   Psychiatric/Behavioral: Negative.    Blood pressure 108/69, pulse 59, temperature 98.2 F (36.8 C), temperature source Oral, resp. rate 16, height $RemoveBe'5\' 6"'sRkNcmzSI$  (1.676 Weiss), weight 94.547 kg (208 lb 7 oz), SpO2 98 %. Physical Exam pleasant male alert and oriented. Left index finger has swelling which is fusiform and loss of motion. He is mildly tender but does not have advanced Kanavel signs at present time. Refills intact. Sensation is intact. 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 He has some degree of a claw posture as well as. This is all old and chronic in my opinion. I reviewed this with him at length. He states he did have a glass injury many years ago which resulted in the position and look of his hand.  There is no evidence of temperature change in the hand middle ring or small fingers.  Elbow wrist and forearm are nontender and neurovascularly intact.  The patient is alert and oriented in no acute distress the patient complains of pain in the affected upper extremity.  The patient is noted to have a normal HEENT exam.  Lung fields show equal chest expansion and no shortness of breath  abdomen exam is nontender without distention.  Lower extremity examination does not show any fracture dislocation or blood clot symptoms.  Pelvis is stable neck and back are stable and nontender  Assessment/Plan: Patient has a swollen left index finger however he does not have impressive Kanavel signs. Certainly this could represent a inflammatory  tenosynovitis. In an effort to distinguish between a advanced infectious process and an inflammatory process I would recommend an MRI scan as well as labs.  His white count is normal which favors a noninfectious issue given the timeframe duration of symptoms.  We're going to wait and obtain the MRI tonight at his  preference.  I discussed with him all issues I have thoroughly reviewed his chart and his x-rays as well as labs and will look forward to reviewing his MRI once complete.  Logan Weiss,Logan Weiss 06/22/2014, 7:00 PM

## 2014-06-22 NOTE — ED Notes (Signed)
Patient reports he has had swelling in the left index finger for 1 month.   He does not recall how he injured his hand.  The index finger is swollen from the knuckle to the finger tip.  Patient states his entire hand was swollen on Thanksgiving.  Patient was seen by Md and put on medications.  The hand swelling has decreased but the finger remains swollen.  He was seen again last week and was put on doxycycline on Wed.  He was also started on med for fluid.  No changes to hand.  Patient was seen at our ucc today and was sent here for futher eval.  No xray has been completed

## 2014-06-22 NOTE — Progress Notes (Signed)
Patient here to establish care Complains of left index finger swollen for the past month Was seen at the urgent care and treated with antibiotics but still swollen

## 2014-06-22 NOTE — ED Notes (Signed)
Patient transported to X-ray 

## 2014-06-22 NOTE — Consult Note (Signed)
  ESR and CRP is normal with a normal WBC MRI is without a obvious abscess I will plan close follow up and plan for Bactim DS 1 po BID for 3 weeks and Indocin 75 ER for 2 weeks I will see Thursday am at 8 am  D/w Rads but no MSK in house for a read I have personally reviewed all issues Aman Bonet MD

## 2014-06-22 NOTE — Progress Notes (Signed)
Patient Demographics  Rolena InfanteSavoy Spiker, is a 43 y.o. male  RUE:[id number]:[id number]  JYN:[id number]  DOB - 10/22/1970  CC:  Chief Complaint  Patient presents with  . Establish Care       HPI: Rolena InfanteSavoy Hain is a 43 y.o. male here today to establish medical care.[facility], Patient has recently been diagnosed with diabetes and is taking metformin, also recently been treated for cellulitis of left hand  apparently got Rocephin IM injection and was prescribed Bactrim which as per patient he took for 10 days, one week ago he was seen by a different provider and was given Lasix as well as doxycycline which as per patient he is still taking his hand swelling is got better but his finger is swollen and painful to touch, currently denies any fever chills, patient reported to have history of MRSA and as per patient he was treated with IV antibiotics in the past.Patient has No headache, No chest pain, No abdominal pain - No Nausea, No new weakness tingling or numbness, No Cough - SOB.  No Known Allergies History reviewed. No pertinent past medical history. Current Outpatient Prescriptions on File Prior to Visit  Medication Sig Dispense Refill  . HYDROcodone-acetaminophen (NORCO) 5-325 MG per tablet Take 1 tablet by mouth every 6 (six) hours as needed for moderate pain. May cause constipation, take stool softener; no tylenol with this 30 tablet 0  . metFORMIN (GLUCOPHAGE) 500 MG tablet Take 1 tablet (500 mg total) by mouth 2 (two) times daily with a meal. 60 tablet 5  . sulfamethoxazole-trimethoprim (BACTRIM DS,SEPTRA DS) 800-160 MG per tablet Take 1 tablet by mouth 2 (two) times daily. 20 tablet 0   No current facility-administered medications on file prior to visit.   Family History  Problem Relation Age of Onset  . Hyperlipidemia Mother   . Mental retardation Mother   . Diabetes Mother   . Diabetes Father   . Hyperlipidemia Father   . Hypertension Father     . Diabetes Maternal Grandmother   . Diabetes Maternal Grandfather   . Diabetes Paternal Grandmother   . Diabetes Paternal Grandfather    History   Social History  . Marital Status: Single    Spouse Name: N/A    Number of Children: N/A  . Years of Education: N/A   Occupational History  . Not on file.   Social History Main Topics  . Smoking status: Former Smoker -- 0.00 packs/day for 5 years  . Smokeless tobacco: Never Used  . Alcohol Use: No     Comment: socially  . Drug Use: No  . Sexual Activity: Not on file   Other Topics Concern  . Not on file   Social History Narrative    Review of Systems: Constitutional: Negative for fever, chills, diaphoresis, activity change, appetite change and fatigue. HENT: Negative for ear pain, nosebleeds, congestion, facial swelling, rhinorrhea, neck pain, neck stiffness and ear discharge.  Eyes: Negative for pain, discharge, redness, itching and visual disturbance. Respiratory: Negative for cough, choking, chest tightness, shortness of breath, wheezing and stridor.  Cardiovascular: Negative for chest pain, palpitations and leg swelling. Gastrointestinal: Negative for abdominal distention. Genitourinary: Negative for dysuria, urgency, frequency, hematuria, flank pain, decreased urine volume, difficulty urinating and dyspareunia.  Musculoskeletal: Negative for back pain, joint swelling, arthralgia and gait problem. Neurological: Negative for dizziness, tremors, seizures, syncope, facial asymmetry, speech difficulty, weakness, light-headedness, numbness and headaches.  Hematological: Negative for adenopathy. Does not  bruise/bleed easily. Psychiatric/Behavioral: Negative for hallucinations, behavioral problems, confusion, dysphoric mood, decreased concentration and agitation.    Objective:   Filed Vitals:   06/22/14 1120  BP: 134/82  Pulse: 101  Temp: 98 F (36.7 C)  Resp: 16    Physical Exam: Constitutional: Patient appears  well-developed and well-nourished. No distress. HENT: Normocephalic, atraumatic, External right and left ear normal. Oropharynx is clear and moist.  Eyes: Conjunctivae and EOM are normal. PERRLA, no scleral icterus. Neck: Normal ROM. Neck supple. No JVD. No tracheal deviation. No thyromegaly. CVS: RRR, S1/S2 +, no murmurs, no gallops, no carotid bruit.  Pulmonary: Effort and breath sounds normal, no stridor, rhonchi, wheezes, rales.  Abdominal: Soft. BS +, no distension, tenderness, rebound or guarding.  Musculoskeletal: . Left hand finger swollen tender to touch minimal warmth no erythema   Neuro: Alert. Normal reflexes, muscle tone coordination. No cranial nerve deficit. Skin: Skin is warm and dry. No rash noted. Not diaphoretic. No erythema. No pallor. Psychiatric: Normal mood and affect. Behavior, judgment, thought content normal.  Lab Results  Component Value Date   WBC 9.9 06/05/2014   HGB 15.2 06/05/2014   HCT 47.5 06/05/2014   MCV 92.0 06/05/2014   PLT 156 07/19/2010   Lab Results  Component Value Date   CREATININE 0.78 06/05/2014   BUN 7 06/05/2014   NA 138 06/05/2014   K 3.9 06/05/2014   CL 101 06/05/2014   CO2 23 06/05/2014    Lab Results  Component Value Date   HGBA1C 6.9 06/05/2014   Lipid Panel  No results found for: CHOL, TRIG, HDL, CHOLHDL, VLDL, LDLCALC     Assessment and plan:   1. Other specified diabetes mellitus without complications Results for orders placed or performed in visit on 06/22/14  Glucose (CBG)  Result Value Ref Range   POC Glucose 111 (A) 70 - 99 mg/dl   His last hemoglobin [id number] was 6.9%, patient recently been started on metformin continued current meds, will repeat A1c in 3 months  2. Swollen finger/3. Cellulitis of finger of left hand/ 4. History of MRSA infection  Patient has been treated with oral antibiotics for more than 2 weeks and still has persistent swelling, I have advised patient to go to the ER needs to repeat his blood  work and possibly needs IV antibiotics.       Patient will follow up after discharge.   Doris CheadleADVANI, Leory Allinson, MD

## 2014-06-22 NOTE — ED Provider Notes (Signed)
  Face-to-face evaluation   History: He complains of left index finger, swelling, for 1 month, despite being treated with a course of Septra, followed by doxycycline, which she is still taking.  There has been no known recent trauma.  He has a remote trauma to the right hand when he injured it, putting his fist through a window.  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  He denies fever, chills, nausea or vomiting.  He was recently placed on metformin, because of newly diagnosed diabetes.  Physical exam: Alert, calm, cooperative.  Left index finger is swollen as compared to the right.  There is no associated erythema, or fluctuance.  He cannot manually flex the left index finger.  With attempts to passively move the left index finger, he has pain and inability to move the MTP, DIP and PIP joints.  There is no erythema or swelling of the hand.  There are no palpable epitrochlear nodes in the left elbow.  Clinical images are in chart.  16:50- case was discussed with on-call hand surgery, Dr. Amanda PeaGramig, who agrees to see the patient in the emergency department.  Labs ordered at the request of the hand surgeon.    Medical screening examination/treatment/procedure(s) were conducted as a shared visit with non-physician practitioner(s) and myself.  I personally evaluated the patient during the encounter    Flint MelterElliott L Din Bookwalter, MD 06/23/14 [phone]

## 2014-06-23 MED ORDER — OXYCODONE-ACETAMINOPHEN 5-325 MG PO TABS: 1.0000 | ORAL_TABLET | ORAL | Status: DC | PRN

## 2014-06-23 MED ORDER — SULFAMETHOXAZOLE-TRIMETHOPRIM 800-160 MG PO TABS: 1.0000 | ORAL_TABLET | Freq: Two times a day (BID) | ORAL | Status: DC

## 2014-06-23 MED ORDER — INDOMETHACIN ER 75 MG PO CPCR: 75.0000 mg | ORAL_CAPSULE | Freq: Every day | ORAL | Status: DC

## 2014-06-23 NOTE — Discharge Instructions (Signed)
Course in the emergency department for swelling to your left index finger. Your labs today have been normal including a normal white blood cell count and normal inflammatory markers. You have been seen by a hand surgeon who does not feel at this time that your symptoms are likely caused by infection. He would like to start you on a strong anti-inflammatory and see you in his office in 2 days. Please continue to elevate your hand, apply ice.    RICE: Routine Care for Injuries The routine care of many injuries includes Rest, Ice, Compression, and Elevation (RICE). HOME CARE INSTRUCTIONS  Rest is needed to allow your body to heal. Routine activities can usually be resumed when comfortable. Injured tendons and bones can take up to 6 weeks to heal. Tendons are the cord-like structures that attach muscle to bone.  Ice following an injury helps keep the swelling down and reduces pain.  Put ice in a plastic bag.  Place a towel between your skin and the bag.  Leave the ice on for 15-20 minutes, 3-4 times a day, or as directed by your health care provider. Do this while awake, for the first 24 to 48 hours. After that, continue as directed by your caregiver.  Compression helps keep swelling down. It also gives support and helps with discomfort. If an elastic bandage has been applied, it should be removed and reapplied every 3 to 4 hours. It should not be applied tightly, but firmly enough to keep swelling down. Watch fingers or toes for swelling, bluish discoloration, coldness, numbness, or excessive pain. If any of these problems occur, remove the bandage and reapply loosely. Contact your caregiver if these problems continue.  Elevation helps reduce swelling and decreases pain. With extremities, such as the arms, hands, legs, and feet, the injured area should be placed near or above the level of the heart, if possible. SEEK IMMEDIATE MEDICAL CARE IF:  You have persistent pain and swelling.  You  develop redness, numbness, or unexpected weakness.  Your symptoms are getting worse rather than improving after several days. These symptoms may indicate that further evaluation or further X-rays are needed. Sometimes, X-rays may not show a small broken bone (fracture) until 1 week or 10 days later. Make a follow-up appointment with your caregiver. Ask when your X-ray results will be ready. Make sure you get your X-ray results. Document Released: 10/07/2000 Document Revised: 06/30/2013 Document Reviewed: 11/24/2010 [facility] Patient Information 2015 [employer] [place]. This information is not intended to replace advice given to you by your health care provider. Make sure you discuss any questions you have with your health care provider.

## 2016-10-10 ENCOUNTER — Emergency Department (HOSPITAL_COMMUNITY)
Admission: EM | Admit: 2016-10-10 | Discharge: 2016-10-11 | Disposition: A | Discharge status: Home / Self Care | Payer: Self-pay | Attending: Emergency Medicine | Admitting: Emergency Medicine

## 2016-10-10 ENCOUNTER — Encounter (HOSPITAL_COMMUNITY): Payer: Self-pay | Admitting: *Deleted

## 2016-10-10 DIAGNOSIS — E1165 Type 2 diabetes mellitus with hyperglycemia: Secondary | ICD-10-CM | POA: Insufficient documentation

## 2016-10-10 DIAGNOSIS — R739 Hyperglycemia, unspecified: Secondary | ICD-10-CM

## 2016-10-10 DIAGNOSIS — Z7984 Long term (current) use of oral hypoglycemic drugs: Secondary | ICD-10-CM | POA: Insufficient documentation

## 2016-10-10 DIAGNOSIS — L0291 Cutaneous abscess, unspecified: Secondary | ICD-10-CM

## 2016-10-10 DIAGNOSIS — K130 Diseases of lips: Secondary | ICD-10-CM | POA: Insufficient documentation

## 2016-10-10 DIAGNOSIS — Z87891 Personal history of nicotine dependence: Secondary | ICD-10-CM | POA: Insufficient documentation

## 2016-10-10 LAB — URINALYSIS, ROUTINE W REFLEX MICROSCOPIC
Bacteria, UA: NONE SEEN
Bilirubin Urine: NEGATIVE
Glucose, UA: >=500 mg/dL — AB
HGB URINE DIPSTICK: NEGATIVE
Ketones, ur: NEGATIVE mg/dL
Leukocytes, UA: NEGATIVE
Nitrite: NEGATIVE
PROTEIN: NEGATIVE mg/dL
RBC / HPF: NONE SEEN RBC/hpf (ref 0–5)
Specific Gravity, Urine: 1.035 — ABNORMAL HIGH (ref 1.005–1.030)
Squamous Epithelial / LPF: NONE SEEN
pH: 6 (ref 5.0–8.0)

## 2016-10-10 LAB — CBC
HEMATOCRIT: 41 % (ref 39.0–52.0)
HEMOGLOBIN: 14 g/dL (ref 13.0–17.0)
MCH: 30.8 pg (ref 26.0–34.0)
MCHC: 34.1 g/dL (ref 30.0–36.0)
MCV: 90.3 fL (ref 78.0–100.0)
Platelets: 244 10*3/uL (ref 150–400)
RBC: 4.54 MIL/uL (ref 4.22–5.81)
RDW: 12.5 % (ref 11.5–15.5)
WBC: 8.5 10*3/uL (ref 4.0–10.5)

## 2016-10-10 LAB — BASIC METABOLIC PANEL
ANION GAP: 12 (ref 5–15)
BUN: 9 mg/dL (ref 6–20)
CHLORIDE: 93 mmol/L — AB (ref 101–111)
CO2: 23 mmol/L (ref 22–32)
Calcium: 8.8 mg/dL — ABNORMAL LOW (ref 8.9–10.3)
Creatinine, Ser: 1.05 mg/dL (ref 0.61–1.24)
GFR calc Af Amer: >60 mL/min (ref >60)
GFR calc non Af Amer: >60 mL/min (ref >60)
GLUCOSE: 581 mg/dL — AB (ref 65–99)
POTASSIUM: 3.5 mmol/L (ref 3.5–5.1)
Sodium: 128 mmol/L — ABNORMAL LOW (ref 135–145)

## 2016-10-10 LAB — CBG MONITORING, ED: Glucose-Capillary: 578 mg/dL (ref 65–99)

## 2016-10-10 MED ORDER — SODIUM CHLORIDE 0.9 % IV BOLUS (SEPSIS)
1000.0000 mL | Freq: Once | INTRAVENOUS | Status: AC
  Administered 2016-10-10: 1000 mL via INTRAVENOUS

## 2016-10-10 MED ORDER — LIDOCAINE-EPINEPHRINE (PF) 2 %-1:200000 IJ SOLN
10.0000 mL | Freq: Once | INTRAMUSCULAR | Status: AC
  Administered 2016-10-10: 10 mL
  Filled 2016-10-10: qty 20

## 2016-10-10 MED ORDER — GLIPIZIDE 5 MG PO TABS: 5.0000 mg | ORAL_TABLET | Freq: Every day | ORAL | 0 refills | Status: DC

## 2016-10-10 MED ORDER — MORPHINE SULFATE (PF) 4 MG/ML IV SOLN
4.0000 mg | Freq: Once | INTRAVENOUS | Status: AC
  Administered 2016-10-10: 4 mg via INTRAVENOUS
  Filled 2016-10-10: qty 1

## 2016-10-10 NOTE — ED Triage Notes (Signed)
The pt is a diabetic that stopped taking any diabetic medicine 6 months ago.  He went to ucc for a lip abscess and they checked his sugar saw it was high and sent him here

## 2016-10-10 NOTE — ED Notes (Signed)
Pt's CBG [id number].  Informed Thayer Ohm, Charity fundraiser.

## 2016-10-10 NOTE — ED Provider Notes (Signed)
[facility] DEPT Provider Note   CSN: [id number] Arrival date & time: 10/10/16  1830     History   Chief Complaint Chief Complaint  Patient presents with  . Hyperglycemia    HPI Logan Weiss is a 46 y.o. male.  The history is provided by the patient and medical records.  Hyperglycemia  Blood sugar level PTA:  500 Duration:  1 week Timing:  Constant Progression:  Waxing and waning Chronicity:  Recurrent Diabetes status:  Controlled with diet and controlled with oral medications Current diabetic therapy:  Invokana Time since last antidiabetic medication:  6 months Context: noncompliance   Context: not change in medication   Ineffective treatments:  Diet Associated symptoms: increased thirst and polyuria   Associated symptoms: no abdominal pain, no altered mental status, no blurred vision, no chest pain, no confusion, no fatigue, no fever, no nausea, no shortness of breath, no vomiting, no weakness and no weight change   Risk factors: no recent steroid use     Presents from urgent care for hyperglycemia and lip abscess. Pt has been off Invokana for six months, managing his blood sugar with diet and exercise. Hor the past week his blood sugar has ran 400-600. He went to urgent care today for evaluation of lip abscess that has been growing for one week. His blood sugar was noted to be >500 and he was sent to ED for evaluation. Denies HA, changes in vision, confusion, N/VD, abdominal pain.   Past Medical History:  Diagnosis Date  . Diabetes [facility])    new dx    Patient Active Problem List   Diagnosis Date Noted  . 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 History of MRSA infection 06/08/2014    Past Surgical History:  Procedure Laterality Date  . APPENDECTOMY    . right knee surgery         Home Medications    Prior to Admission medications   Medication Sig Start Date End Date Taking? Authorizing Provider  doxycycline (VIBRA-TABS)  100 MG tablet Take 100 mg by mouth 2 (two) times daily.    Historical Provider, MD  furosemide (LASIX) 20 MG tablet Take 20 mg by mouth daily.    Historical Provider, MD  glipiZIDE (GLUCOTROL) 5 MG tablet Take 1 tablet (5 mg total) by mouth daily before breakfast. 10/10/16   Monico Blitz, MD  GlucoCom Lancets MISC Check blood sugar TID & QHS 06/22/14   Lorayne Marek, MD  glucose blood (CHOICE DM FORA G20 TEST STRIPS) test strip Use as instructed 06/22/14   Lorayne Marek, MD  glucose monitoring kit (FREESTYLE) monitoring kit 1 each by Does not apply route as needed for other. 06/22/14   Lorayne Marek, MD  HYDROcodone-acetaminophen (NORCO) 10-325 MG per tablet Take 1 tablet by mouth every 6 (six) hours as needed.    Historical Provider, MD  HYDROcodone-acetaminophen (NORCO) 5-325 MG per tablet Take 1 tablet by mouth every 6 (six) hours as needed for moderate pain. May cause constipation, take stool softener; no tylenol with this Patient not taking: Reported on 06/22/2014 06/05/14   Thao P Le, DO  Ibuprofen-Diphenhydramine Cit (ADVIL PM PO) Take 2 capsules by mouth every 4 (four) hours as needed (pain).    Historical Provider, MD  indomethacin (INDOCIN SR) 75 MG CR capsule Take 1 capsule (75 mg total) by mouth daily with breakfast. 06/23/14   Delice Bison Ward, DO  metFORMIN (GLUCOPHAGE) 500 MG tablet Take 1 tablet (500 mg  total) by mouth 2 (two) times daily with a meal. 06/05/14   Thao P Le, DO  oxyCODONE-acetaminophen (PERCOCET/ROXICET) 5-325 MG per tablet Take 1-2 tablets by mouth every 4 (four) hours as needed. 06/23/14   Kristen N Ward, DO  sulfamethoxazole-trimethoprim (SEPTRA DS) 800-160 MG per tablet Take 1 tablet by mouth 2 (two) times daily. 06/23/14   Delice Bison Ward, DO    Family History Family History  Problem Relation Age of Onset  . Hyperlipidemia Mother   . Mental retardation Mother   . Diabetes Mother   . Diabetes Father   . Hyperlipidemia Father   . Hypertension Father    . Diabetes Maternal Grandmother   . Diabetes Maternal Grandfather   . Diabetes Paternal Grandmother   . Diabetes Paternal Grandfather     Social History Social History  Substance Use Topics  . Smoking status: Former Smoker    Packs/day: 0.00    Years: 5.00  . Smokeless tobacco: Never Used  . Alcohol use No     Comment: socially     Allergies   Patient has no known allergies.   Review of Systems Review of Systems  Constitutional: Negative for fatigue and fever.  Eyes: Negative for blurred vision.  Respiratory: Negative for shortness of breath.   Cardiovascular: Negative for chest pain.  Gastrointestinal: Negative for abdominal pain, nausea and vomiting.  Endocrine: Positive for polydipsia and polyuria.  Neurological: Negative for weakness and light-headedness.  Psychiatric/Behavioral: Negative for confusion.     Physical Exam Updated Vital Signs BP (!) 141/84   Pulse 89   Temp 98.3 F (36.8 C) (Oral)   Resp (!) 22   Ht _0  (1.676 m)   Wt 90.7 kg   SpO2 92%   BMI 32.28 kg/m   Physical Exam  Constitutional: He is oriented to person, place, and time. He appears well-developed and well-nourished.  HENT:  Head: Normocephalic and atraumatic.  Mouth/Throat:    Eyes: Conjunctivae and EOM are normal.  Neck: Normal range of motion. Neck supple.  Cardiovascular: Normal rate and regular rhythm.   No murmur heard. Pulmonary/Chest: Effort normal and breath sounds normal. No respiratory distress.  Abdominal: Soft. There is no tenderness.  Musculoskeletal: Normal range of motion.  Neurological: He is alert and oriented to person, place, and time.  Skin: Skin is warm and dry. Capillary refill takes less than 2 seconds.  Psychiatric: He has a normal mood and affect.  Nursing note and vitals reviewed.    ED Treatments / Results  Labs (all labs ordered are listed, but only abnormal results are displayed) Labs Reviewed  BASIC METABOLIC PANEL - Abnormal; Notable  for the following:       Result Value   Sodium 128 (*)    Chloride 93 (*)    Glucose, Bld 581 (*)    Calcium 8.8 (*)    All other components within normal limits  URINALYSIS, ROUTINE W REFLEX MICROSCOPIC - Abnormal; Notable for the following:    Color, Urine STRAW (*)    Specific Gravity, Urine 1.035 (*)    Glucose, UA >=500 (*)    All other components within normal limits  CBG MONITORING, ED - Abnormal; Notable for the following:    Glucose-Capillary 578 (*)    All other components within normal limits  CBC  CBG MONITORING, ED    EKG  EKG Interpretation None       Radiology No results found.  Procedures .Marland KitchenIncision and Drainage Date/Time: 10/10/2016 11:03 PM  Performed by: Monico Blitz Authorized by: Pattricia Boss   Consent:    Consent obtained:  Verbal   Consent given by:  Patient   Risks discussed:  Bleeding, incomplete drainage and pain   Alternatives discussed:  No treatment Location:    Type:  Abscess   Size:  3 cm   Location:  Mouth (left lower lip) Pre-procedure details:    Skin preparation:  Chloraprep Anesthesia (see MAR for exact dosages):    Anesthesia method:  Local infiltration   Local anesthetic:  Lidocaine 2% w/o epi Procedure type:    Complexity:  Simple Procedure details:    Needle aspiration: no     Incision types:  Single straight   Incision depth:  Submucosal   Scalpel blade:  11   Wound management:  Probed and deloculated and irrigated with saline   Drainage:  Bloody and purulent   Drainage amount:  Moderate   Wound treatment:  Wound left open   Packing materials:  None Post-procedure details:    Patient tolerance of procedure:  Tolerated well, no immediate complications   (including critical care time)  Medications Ordered in ED Medications  morphine 4 MG/ML injection 4 mg (not administered)  sodium chloride 0.9 % bolus 1,000 mL (0 mLs Intravenous Stopped 10/10/16 2218)  lidocaine-EPINEPHrine (XYLOCAINE W/EPI) 2  %-1:200000 (PF) injection 10 mL (10 mLs Infiltration Given 10/10/16 2055)  sodium chloride 0.9 % bolus 1,000 mL (1,000 mLs Intravenous New Bag/Given 10/10/16 2218)     Initial Impression / Assessment and Plan / ED Course  I have reviewed the triage vital signs and the nursing notes.  Pertinent labs & imaging results that were available during my care of the patient were reviewed by me and considered in my medical decision making (see chart for details).    46 y.o. male presents for hyperglycemia and lip abscess. Pt has been off medication for six months. Labs obtained in triage significant for glucose 581, CO2 23 and normal AG. No ketones in urine. No leukocytosis. AF, VSS, NAD. 2L NS bolus given. Large lower lip abscess does not involve dentition. Incision and drainage as documented above. Pt states he was unable to tolerate metformin due to N/V and cannot afford Invokana. Will start low dose glipizide and advised close follow-up with PCP for further management of DM as well as wound check.  Final Clinical Impressions(s) / ED Diagnoses   Final diagnoses:  Hyperglycemia  Abscess    New Prescriptions New Prescriptions   GLIPIZIDE (GLUCOTROL) 5 MG TABLET    Take 1 tablet (5 mg total) by mouth daily before breakfast.     Monico Blitz, MD 10/10/16 2329    Pattricia Boss, MD 10/12/16 1416

## 2016-10-11 NOTE — ED Notes (Signed)
Pt departed in NAD.  

## 2016-11-20 ENCOUNTER — Encounter (HOSPITAL_COMMUNITY): Payer: Self-pay | Admitting: Emergency Medicine

## 2016-11-20 ENCOUNTER — Emergency Department (HOSPITAL_COMMUNITY)
Admission: EM | Admit: 2016-11-20 | Discharge: 2016-11-20 | Disposition: A | Discharge status: Home / Self Care | Payer: Self-pay | Attending: Emergency Medicine | Admitting: Emergency Medicine

## 2016-11-20 DIAGNOSIS — Z7984 Long term (current) use of oral hypoglycemic drugs: Secondary | ICD-10-CM | POA: Insufficient documentation

## 2016-11-20 DIAGNOSIS — R51 Headache: Secondary | ICD-10-CM | POA: Insufficient documentation

## 2016-11-20 DIAGNOSIS — R519 Headache, unspecified: Secondary | ICD-10-CM

## 2016-11-20 DIAGNOSIS — Z79899 Other long term (current) drug therapy: Secondary | ICD-10-CM | POA: Insufficient documentation

## 2016-11-20 DIAGNOSIS — E119 Type 2 diabetes mellitus without complications: Secondary | ICD-10-CM | POA: Insufficient documentation

## 2016-11-20 DIAGNOSIS — Z87891 Personal history of nicotine dependence: Secondary | ICD-10-CM | POA: Insufficient documentation

## 2016-11-20 MED ORDER — BUTALBITAL-APAP-CAFFEINE 50-325-40 MG PO TABS: 1.0000 | ORAL_TABLET | Freq: Four times a day (QID) | ORAL | 0 refills | Status: AC | PRN

## 2016-11-20 MED ORDER — KETOROLAC TROMETHAMINE 15 MG/ML IJ SOLN
15.0000 mg | Freq: Once | INTRAMUSCULAR | Status: AC
  Administered 2016-11-20: 15 mg via INTRAVENOUS
  Filled 2016-11-20: qty 1

## 2016-11-20 MED ORDER — PROCHLORPERAZINE EDISYLATE 5 MG/ML IJ SOLN
10.0000 mg | Freq: Once | INTRAMUSCULAR | Status: AC
  Administered 2016-11-20: 10 mg via INTRAVENOUS
  Filled 2016-11-20: qty 2

## 2016-11-20 MED ORDER — SODIUM CHLORIDE 0.9 % IV BOLUS (SEPSIS)
1000.0000 mL | Freq: Once | INTRAVENOUS | Status: AC
  Administered 2016-11-20: 1000 mL via INTRAVENOUS

## 2016-11-20 MED ORDER — DIPHENHYDRAMINE HCL 50 MG/ML IJ SOLN
25.0000 mg | Freq: Once | INTRAMUSCULAR | Status: AC
  Administered 2016-11-20: 25 mg via INTRAVENOUS
  Filled 2016-11-20: qty 1

## 2016-11-20 NOTE — ED Triage Notes (Signed)
Pt sts frontal HA x 3 days  

## 2016-11-20 NOTE — ED Provider Notes (Signed)
[facility] DEPT Provider Note   CSN: [id number] Arrival date & time: 11/20/16  1244   By signing my name below, I, Logan Weiss, attest that this documentation has been prepared under the direction and in the presence of Logan Manifold, MD. Electronically Signed: Soijett Weiss, ED Scribe. 11/20/16. 2:47 PM.  History   Chief Complaint Chief Complaint  Patient presents with  . Headache    HPI Logan Weiss is a 46 y.o. male with a PMHx of DM, who presents to the Emergency Department complaining of frontal HA onset 2 days ago. He notes that this HA is not similar to HA that he has had in the past. Pt reports associated resolved fever. Pt has tried advil and tylenol with no relief of his symptoms. He denies ear pain, vision change, nausea, numbness, tingling, nasal congestion, photophobia, and any other symptoms. Denies allergies to any medications.     The history is provided by the patient. No language interpreter was used.    Past Medical History:  Diagnosis Date  . Diabetes [facility])    new dx    Patient Active Problem List   Diagnosis Date Noted  . 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 History of MRSA infection 06/08/2014    Past Surgical History:  Procedure Laterality Date  . APPENDECTOMY    . right knee surgery         Home Medications    Prior to Admission medications   Medication Sig Start Date End Date Taking? Authorizing Provider  doxycycline (VIBRA-TABS) 100 MG tablet Take 100 mg by mouth 2 (two) times daily.    [provider]  furosemide (LASIX) 20 MG tablet Take 20 mg by mouth daily.    [provider]  glipiZIDE (GLUCOTROL) 5 MG tablet Take 1 tablet (5 mg total) by mouth daily before breakfast. 10/10/16   Caudill, Gwynneth Aliment, MD  GlucoCom Lancets MISC Check blood sugar TID & QHS 06/22/14   Advani, Vernon Prey, MD  glucose blood (CHOICE DM FORA G20 TEST STRIPS) test strip Use as instructed 06/22/14    Advani, Vernon Prey, MD  glucose monitoring kit (FREESTYLE) monitoring kit 1 each by Does not apply route as needed for other. 06/22/14   Lorayne Marek, MD  HYDROcodone-acetaminophen (NORCO) 10-325 MG per tablet Take 1 tablet by mouth every 6 (six) hours as needed.    [provider]  HYDROcodone-acetaminophen (NORCO) 5-325 MG per tablet Take 1 tablet by mouth every 6 (six) hours as needed for moderate pain. May cause constipation, take stool softener; no tylenol with this Patient not taking: Reported on 06/22/2014 06/05/14   Le, Thao P, DO  Ibuprofen-Diphenhydramine Cit (ADVIL PM PO) Take 2 capsules by mouth every 4 (four) hours as needed (pain).    [provider]  indomethacin (INDOCIN SR) 75 MG CR capsule Take 1 capsule (75 mg total) by mouth daily with breakfast. 06/23/14   Ward, Delice Bison, DO  metFORMIN (GLUCOPHAGE) 500 MG tablet Take 1 tablet (500 mg total) by mouth 2 (two) times daily with a meal. 06/05/14   Le, Thao P, DO  oxyCODONE-acetaminophen (PERCOCET/ROXICET) 5-325 MG per tablet Take 1-2 tablets by mouth every 4 (four) hours as needed. 06/23/14   Ward, Delice Bison, DO  sulfamethoxazole-trimethoprim (SEPTRA DS) 800-160 MG per tablet Take 1 tablet by mouth 2 (two) times daily. 06/23/14   Ward, Delice Bison, DO    Family History Family History  Problem Relation Age of Onset  . Hyperlipidemia  Mother   . Mental retardation Mother   . Diabetes Mother   . Diabetes Father   . Hyperlipidemia Father   . Hypertension Father   . Diabetes Maternal Grandmother   . Diabetes Maternal Grandfather   . Diabetes Paternal Grandmother   . Diabetes Paternal Grandfather     Social History Social History  Substance Use Topics  . Smoking status: Former Smoker    Packs/day: 0.00    Years: 5.00  . Smokeless tobacco: Never Used  . Alcohol use No     Comment: socially     Allergies   Patient has no known allergies.   Review of Systems Review of Systems  HENT: Negative for  congestion and ear pain.   Eyes: Negative for photophobia and visual disturbance.  Gastrointestinal: Negative for nausea.  Neurological: Positive for headaches (frontal). Negative for numbness.       No tingling  All other systems reviewed and are negative.    Physical Exam Updated Vital Signs BP 118/69   Pulse 94   Temp 98.6 F (37 C) (Oral)   Resp 16   SpO2 97%   Physical Exam  Constitutional: He is oriented to person, place, and time. He appears well-developed and well-nourished.  HENT:  Head: Normocephalic and atraumatic.  Right Ear: External ear normal.  Left Ear: External ear normal.  Nose: Nose normal.  Eyes: Right eye exhibits no discharge. Left eye exhibits no discharge.  Neck: Neck supple.  No nuchal rigidity.  Cardiovascular: Normal rate, regular rhythm and normal heart sounds.  Exam reveals no gallop and no friction rub.   No murmur heard. Pulmonary/Chest: Effort normal and breath sounds normal. No respiratory distress. He has no wheezes. He has no rales.  Abdominal: Soft. There is no tenderness.  Musculoskeletal: He exhibits no edema.  Neurological: He is alert and oriented to person, place, and time.  Skin: Skin is warm and dry.  Nursing note and vitals reviewed.    ED Treatments / Results  DIAGNOSTIC STUDIES: Oxygen Saturation is 97% on RA, nl by my interpretation.    COORDINATION OF CARE: 2:45 PM Discussed treatment plan with pt at bedside and pt agreed to plan.   Labs (all labs ordered are listed, but only abnormal results are displayed) Labs Reviewed - No data to display  EKG  EKG Interpretation None       Radiology No results found.  Procedures Procedures (including critical care time)  Medications Ordered in ED Medications  sodium chloride 0.9 % bolus 1,000 mL (0 mLs Intravenous Stopped 11/20/16 1626)  ketorolac (TORADOL) 15 MG/ML injection 15 mg (15 mg Intravenous Given 11/20/16 1517)  prochlorperazine (COMPAZINE) injection 10 mg  (10 mg Intravenous Given 11/20/16 1518)  diphenhydrAMINE (BENADRYL) injection 25 mg (25 mg Intravenous Given 11/20/16 1518)     Initial Impression / Assessment and Plan / ED Course  I have reviewed the triage vital signs and the nursing notes.  Pertinent labs & imaging results that were available during my care of the patient were reviewed by me and considered in my medical decision making (see chart for details).      45yM with headache. Suspect primary HA. Consider emergent secondary causes such as bleed, infectious or mass but doubt. There is no history of trauma. Pt has a nonfocal neurological exam. Afebrile and neck supple. No use of blood thinning medication. Consider ocular etiology such as acute angle closure glaucoma but doubt. Pt denies acute change in visual acuity and eye exam  unremarkable. Doubt temporal arteritis given age, no temporal tenderness and temporal artery pulsations palpable. Doubt CO poisoning. No contacts with similar symptoms. Doubt venous thrombosis. Doubt carotid or vertebral arteries dissection. Symptoms improved with meds. Feel that can be safely discharged, but strict return precautions discussed. Outpt fu.   Final Clinical Impressions(s) / ED Diagnoses   Final diagnoses:  Acute nonintractable headache, unspecified headache type    New Prescriptions New Prescriptions   No medications on file   I personally preformed the services scribed in my presence. The recorded information has been reviewed is accurate. Logan Manifold, MD.     Logan Manifold, MD 12/03/16 [phone]

## 2017-01-17 ENCOUNTER — Encounter (HOSPITAL_COMMUNITY): Payer: Self-pay | Admitting: Emergency Medicine

## 2017-01-17 ENCOUNTER — Emergency Department (HOSPITAL_COMMUNITY)
Admission: EM | Admit: 2017-01-17 | Discharge: 2017-01-17 | Disposition: A | Discharge status: Home / Self Care | Payer: Self-pay | Attending: Emergency Medicine | Admitting: Emergency Medicine

## 2017-01-17 DIAGNOSIS — L0291 Cutaneous abscess, unspecified: Secondary | ICD-10-CM

## 2017-01-17 DIAGNOSIS — Z87891 Personal history of nicotine dependence: Secondary | ICD-10-CM | POA: Insufficient documentation

## 2017-01-17 DIAGNOSIS — Z7984 Long term (current) use of oral hypoglycemic drugs: Secondary | ICD-10-CM | POA: Insufficient documentation

## 2017-01-17 DIAGNOSIS — L02415 Cutaneous abscess of right lower limb: Secondary | ICD-10-CM | POA: Insufficient documentation

## 2017-01-17 DIAGNOSIS — Z79899 Other long term (current) drug therapy: Secondary | ICD-10-CM | POA: Insufficient documentation

## 2017-01-17 DIAGNOSIS — E119 Type 2 diabetes mellitus without complications: Secondary | ICD-10-CM | POA: Insufficient documentation

## 2017-01-17 DIAGNOSIS — L02212 Cutaneous abscess of back [any part, except buttock]: Secondary | ICD-10-CM | POA: Insufficient documentation

## 2017-01-17 DIAGNOSIS — H6012 Cellulitis of left external ear: Secondary | ICD-10-CM | POA: Insufficient documentation

## 2017-01-17 LAB — CBG MONITORING, ED: GLUCOSE-CAPILLARY: 107 mg/dL — AB (ref 65–99)

## 2017-01-17 MED ORDER — SULFAMETHOXAZOLE-TRIMETHOPRIM 800-160 MG PO TABS: 1.0000 | ORAL_TABLET | Freq: Two times a day (BID) | ORAL | 0 refills | Status: AC

## 2017-01-17 MED ORDER — CEPHALEXIN 500 MG PO CAPS: 500.0000 mg | ORAL_CAPSULE | Freq: Four times a day (QID) | ORAL | 0 refills | Status: DC

## 2017-01-17 NOTE — ED Provider Notes (Signed)
[facility] DEPT Provider Note   CSN: [id number] Arrival date & time: 01/17/17  1501  By signing my name below, I, Sonum Patel, attest that this documentation has been prepared under the direction and in the presence of [facility], PA-C. Electronically Signed: Sonum Patel, Scribe. 01/17/17. 4:50 PM.  History   Chief Complaint Chief Complaint  Patient presents with  . Abscess  . Otalgia    The history is provided by the patient. No language interpreter was used.     HPI Comments: Logan Weiss is a 46 y.o. male with past medical history of DM who presents to the Emergency Department complaining of 2 possible abscesses for the past several days. He states the first area is to the top of his back and the 2nd area is to the right posterior thigh. He reports gradual onset, constant, gradually worsened redness and swelling to those 2 areas. He reports a history of abscesses and states this feels similar.   He also complains of left ear swelling for the past few days. He reports associated itching to the inside of the left ear and occasional drainage (chronically). He denies fever, chills, myalgias, sinus pain, facial pain or swelling.    Past Medical History:  Diagnosis Date  . Diabetes [facility])    new dx    Patient Active Problem List   Diagnosis Date Noted  . 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 History of MRSA infection 06/08/2014    Past Surgical History:  Procedure Laterality Date  . APPENDECTOMY    . right knee surgery         Home Medications    Prior to Admission medications   Medication Sig Start Date End Date Taking? Authorizing Provider  butalbital-acetaminophen-caffeine (FIORICET, ESGIC) [phone] MG tablet Take 1 tablet by mouth every 6 (six) hours as needed for headache. 11/20/16 11/20/17  Virgel Manifold, MD  cephALEXin (KEFLEX) 500 MG capsule Take 1 capsule (500 mg total) by mouth 4 (four) times daily. 01/17/17   Clayton Bibles,  PA-C  doxycycline (VIBRA-TABS) 100 MG tablet Take 100 mg by mouth 2 (two) times daily.    [provider]  furosemide (LASIX) 20 MG tablet Take 20 mg by mouth daily.    [provider]  glipiZIDE (GLUCOTROL) 5 MG tablet Take 1 tablet (5 mg total) by mouth daily before breakfast. 10/10/16   Caudill, Gwynneth Aliment, MD  GlucoCom Lancets MISC Check blood sugar TID & QHS 06/22/14   Advani, Vernon Prey, MD  glucose blood (CHOICE DM FORA G20 TEST STRIPS) test strip Use as instructed 06/22/14   Advani, Vernon Prey, MD  glucose monitoring kit (FREESTYLE) monitoring kit 1 each by Does not apply route as needed for other. 06/22/14   Lorayne Marek, MD  HYDROcodone-acetaminophen (NORCO) 10-325 MG per tablet Take 1 tablet by mouth every 6 (six) hours as needed.    [provider]  Ibuprofen-Diphenhydramine Cit (ADVIL PM PO) Take 2 capsules by mouth every 4 (four) hours as needed (pain).    [provider]  indomethacin (INDOCIN SR) 75 MG CR capsule Take 1 capsule (75 mg total) by mouth daily with breakfast. 06/23/14   Ward, Delice Bison, DO  metFORMIN (GLUCOPHAGE) 500 MG tablet Take 1 tablet (500 mg total) by mouth 2 (two) times daily with a meal. 06/05/14   Le, Thao P, DO  oxyCODONE-acetaminophen (PERCOCET/ROXICET) 5-325 MG per tablet Take 1-2 tablets by mouth every 4 (four) hours as needed. 06/23/14   Ward,  Kristen N, DO  sulfamethoxazole-trimethoprim (BACTRIM DS,SEPTRA DS) 800-160 MG tablet Take 1 tablet by mouth 2 (two) times daily. 01/17/17 01/27/17  Clayton Bibles, PA-C    Family History Family History  Problem Relation Age of Onset  . Hyperlipidemia Mother   . Mental retardation Mother   . Diabetes Mother   . Diabetes Father   . Hyperlipidemia Father   . Hypertension Father   . Diabetes Maternal Grandmother   . Diabetes Maternal Grandfather   . Diabetes Paternal Grandmother   . Diabetes Paternal Grandfather     Social History Social History  Substance Use Topics  .  Smoking status: Former Smoker    Packs/day: 0.00    Years: 5.00  . Smokeless tobacco: Never Used  . Alcohol use No     Comment: socially     Allergies   Patient has no known allergies.   Review of Systems Review of Systems  Constitutional: Negative for chills and fever.  HENT: Positive for ear pain. Negative for facial swelling and sinus pain.        + external ear swelling   Musculoskeletal: Negative for myalgias.  Skin: Positive for wound.     Physical Exam Updated Vital Signs BP 137/69   Pulse 80   Temp 98.3 F (36.8 C) (Oral)   Resp 18   SpO2 100%   Physical Exam  Constitutional: He appears well-developed and well-nourished. No distress.  HENT:  Head: Normocephalic and atraumatic.  Left external ear with diffuse edema, erythema, and mild tenderness. Left TM is opaque. Right TM with remote perforation   Neck: Neck supple.  Pulmonary/Chest: Effort normal.  Neurological: He is alert.  Skin: He is not diaphoretic. There is erythema.  Upper back midline with small area of induration and central scabbing.  Right posterior thigh with small area erythema, induration, and central scabbing. No fluctuance or active drainage.   Nursing note and vitals reviewed.    ED Treatments / Results  DIAGNOSTIC STUDIES: Oxygen Saturation is 100% on RA, normal by my interpretation.    COORDINATION OF CARE: 4:50 PM Discussed treatment plan with pt at bedside and pt agreed to plan.   Labs (all labs ordered are listed, but only abnormal results are displayed) Labs Reviewed  CBG MONITORING, ED - Abnormal; Notable for the following:       Result Value   Glucose-Capillary 107 (*)    All other components within normal limits    EKG  EKG Interpretation None       Radiology No results found.  Procedures Procedures (including critical care time)  Medications Ordered in ED Medications - No data to display   Initial Impression / Assessment and Plan / ED Course  I have  reviewed the triage vital signs and the nursing notes.  Pertinent labs & imaging results that were available during my care of the patient were reviewed by me and considered in my medical decision making (see chart for details).     Afebrile, nontoxic patient with hx chronic skin infections with few new areas of abscess and cellulitis including two abscesses that have induration but no fluctuance, clinically not amenable to I&D at this point.  Left external ear is cellulitis, no apparent abscess clinically.  Discussed home care with patient.  Pt is very familiar with recurrent abscesses and knows the s/s of needing follow up for worsening condition.   D/C home with keflex, bactrim, PCP/ED f/u PRN.  Discussed result, findings, treatment, and follow up  with patient.  Pt given return precautions.  Pt verbalizes understanding and agrees with plan.       Final Clinical Impressions(s) / ED Diagnoses   Final diagnoses:  Cellulitis of left ear  Abscess    New Prescriptions Discharge Medication List as of 01/17/2017  5:03 PM    START taking these medications   Details  cephALEXin (KEFLEX) 500 MG capsule Take 1 capsule (500 mg total) by mouth 4 (four) times daily., Starting Thu 01/17/2017, Print       I personally performed the services described in this documentation, which was scribed in my presence. The recorded information has been reviewed and is accurate.    Clayton Bibles, PA-C 01/17/17 1717    Margette Fast, MD 01/18/17 [phone]

## 2017-01-17 NOTE — Discharge Instructions (Signed)
Read the information below.  Use the prescribed medication as directed.  Please discuss all new medications with your pharmacist.  You may return to the Emergency Department at any time for worsening condition or any new symptoms that concern you.   If you develop increased redness, swelling, uncontrolled pain, or fevers greater than 100.4, return to the ER immediately for a recheck.   °

## 2017-01-17 NOTE — ED Triage Notes (Signed)
Pt sts abscess in middle of back and right upper leg x several days; pt sts left ear pain

## 2017-03-21 ENCOUNTER — Encounter (HOSPITAL_COMMUNITY): Payer: Self-pay | Admitting: *Deleted

## 2017-03-21 ENCOUNTER — Emergency Department (HOSPITAL_COMMUNITY)
Admission: EM | Admit: 2017-03-21 | Discharge: 2017-03-21 | Disposition: A | Discharge status: Home / Self Care | Payer: Self-pay | Attending: Emergency Medicine | Admitting: Emergency Medicine

## 2017-03-21 ENCOUNTER — Emergency Department (HOSPITAL_COMMUNITY): Payer: Self-pay

## 2017-03-21 DIAGNOSIS — M25551 Pain in right hip: Secondary | ICD-10-CM | POA: Insufficient documentation

## 2017-03-21 DIAGNOSIS — M5441 Lumbago with sciatica, right side: Secondary | ICD-10-CM | POA: Insufficient documentation

## 2017-03-21 DIAGNOSIS — Z7984 Long term (current) use of oral hypoglycemic drugs: Secondary | ICD-10-CM | POA: Insufficient documentation

## 2017-03-21 DIAGNOSIS — E119 Type 2 diabetes mellitus without complications: Secondary | ICD-10-CM | POA: Insufficient documentation

## 2017-03-21 DIAGNOSIS — Z79899 Other long term (current) drug therapy: Secondary | ICD-10-CM | POA: Insufficient documentation

## 2017-03-21 MED ORDER — METHOCARBAMOL 500 MG PO TABS
500.0000 mg | ORAL_TABLET | Freq: Once | ORAL | Status: AC
  Administered 2017-03-21: 500 mg via ORAL
  Filled 2017-03-21: qty 1

## 2017-03-21 MED ORDER — GABAPENTIN 300 MG PO CAPS: 300.0000 mg | ORAL_CAPSULE | Freq: Three times a day (TID) | ORAL | 0 refills | Status: DC

## 2017-03-21 MED ORDER — CYCLOBENZAPRINE HCL 10 MG PO TABS: 10.0000 mg | ORAL_TABLET | Freq: Two times a day (BID) | ORAL | 0 refills | Status: DC | PRN

## 2017-03-21 MED ORDER — IBUPROFEN 600 MG PO TABS: 600.0000 mg | ORAL_TABLET | Freq: Four times a day (QID) | ORAL | 0 refills | Status: DC | PRN

## 2017-03-21 MED ORDER — HYDROCODONE-ACETAMINOPHEN 5-325 MG PO TABS
1.0000 | ORAL_TABLET | Freq: Once | ORAL | Status: AC
  Administered 2017-03-21: 1 via ORAL
  Filled 2017-03-21: qty 1

## 2017-03-21 NOTE — ED Provider Notes (Signed)
[facility] DEPT Provider Note   CSN: [id number] Arrival date & time: 03/21/17  2005     History   Chief Complaint Chief Complaint  Patient presents with  . Hip Pain    HPI Logan Weiss is a 46 y.o. male who presents with right hip pain. PMH significant for Type 2 DM. He states that for the past three days he has had gradually worsening right hip pain and right sided back pain which radiates to the right ankle. It is sharp and stabbing. He reports associated tingling. He has never had this before. 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 No fever, syncope, trauma, unexplained weight loss, hx of cancer, loss of bowel/bladder function, saddle anesthesia, urinary retention, IVDU. He has been taking Ibuprofen and Tylenol for pain with minimal relief.    HPI  Past Medical History:  Diagnosis Date  . Diabetes [facility])    new dx    Patient Active Problem List   Diagnosis Date Noted  . Type 2 diabetes mellitus with diabetic dermatitis ([place]) 06/08/2014  . History of MRSA infection 06/08/2014    Past Surgical History:  Procedure Laterality Date  . APPENDECTOMY    . right knee surgery         Home Medications    Prior to Admission medications   Medication Sig Start Date End Date Taking? Authorizing Provider  butalbital-acetaminophen-caffeine (FIORICET, ESGIC) [phone] MG tablet Take 1 tablet by mouth every 6 (six) hours as needed for headache. 11/20/16 11/20/17  Virgel Manifold, MD  cephALEXin (KEFLEX) 500 MG capsule Take 1 capsule (500 mg total) by mouth 4 (four) times daily. 01/17/17   Clayton Bibles, PA-C  doxycycline (VIBRA-TABS) 100 MG tablet Take 100 mg by mouth 2 (two) times daily.    [provider]  furosemide (LASIX) 20 MG tablet Take 20 mg by mouth daily.    [provider]  glipiZIDE (GLUCOTROL) 5 MG tablet Take 1 tablet (5 mg total) by mouth daily before breakfast. 10/10/16   Caudill, Gwynneth Aliment, MD    GlucoCom Lancets MISC Check blood sugar TID & QHS 06/22/14   Advani, Vernon Prey, MD  glucose blood (CHOICE DM FORA G20 TEST STRIPS) test strip Use as instructed 06/22/14   Advani, Vernon Prey, MD  glucose monitoring kit (FREESTYLE) monitoring kit 1 each by Does not apply route as needed for other. 06/22/14   Lorayne Marek, MD  HYDROcodone-acetaminophen (NORCO) 10-325 MG per tablet Take 1 tablet by mouth every 6 (six) hours as needed.    [provider]  Ibuprofen-Diphenhydramine Cit (ADVIL PM PO) Take 2 capsules by mouth every 4 (four) hours as needed (pain).    [provider]  metFORMIN (GLUCOPHAGE) 500 MG tablet Take 1 tablet (500 mg total) by mouth 2 (two) times daily with a meal. 06/05/14   Le, Thao P, DO    Family History Family History  Problem Relation Age of Onset  . Hyperlipidemia Mother   . Mental retardation Mother   . Diabetes Mother   . Diabetes Father   . Hyperlipidemia Father   . Hypertension Father   . Diabetes Maternal Grandmother   . Diabetes Maternal Grandfather   . Diabetes Paternal Grandmother   . Diabetes Paternal Grandfather     Social History Social History  Substance Use Topics  . Smoking status: Former Smoker    Packs/day: 0.00    Years: 5.00  . Smokeless tobacco: Never Used  . Alcohol  use No     Comment: socially     Allergies   Patient has no known allergies.   Review of Systems Review of Systems  Constitutional: Negative for fever.  Musculoskeletal: Positive for back pain and myalgias. Negative for arthralgias, gait problem and neck pain.  Skin: Negative for wound.  Neurological: Positive for numbness. Negative for weakness.     Physical Exam Updated Vital Signs BP 128/84   Pulse (!) 102   Temp 98.8 F (37.1 C)   Resp 16   Ht '5\' 6"'$  (1.676 m)   Wt 86.2 kg (190 lb)   SpO2 99%   BMI 30.67 kg/m   Physical Exam  Constitutional: He is oriented to person, place, and time. He appears well-developed and well-nourished. No  distress.  Appears uncomfortable  HENT:  Head: Normocephalic and atraumatic.  Eyes: Pupils are equal, round, and reactive to light. Conjunctivae are normal. Right eye exhibits no discharge. Left eye exhibits no discharge. No scleral icterus.  Neck: Normal range of motion.  Cardiovascular: Normal rate.   Pulmonary/Chest: Effort normal. No respiratory distress.  Abdominal: He exhibits no distension.  Musculoskeletal:  Back: Inspection: No masses, deformity, or rash Palpation: Lumbar midline spinal tenderness with right sided paraspinal muscle tenderness. Strength: 5/5 in lower extremities and normal plantar and dorsiflexion Sensation: Intact sensation with light touch in lower extremities bilaterally Reflexes: Patellar reflex is 2+ bilaterally SLR: Positive on right side  Right hip: Diffuse tenderness. Pain elicited with ROM.   Neurological: He is alert and oriented to person, place, and time.  Skin: Skin is warm and dry.  Psychiatric: He has a normal mood and affect. His behavior is normal.  Nursing note and vitals reviewed.    ED Treatments / Results  Labs (all labs ordered are listed, but only abnormal results are displayed) Labs Reviewed - No data to display  EKG  EKG Interpretation None       Radiology Dg Lumbar Spine Complete  Result Date: 03/21/2017 CLINICAL DATA:  Low back pain since Tuesday. EXAM: LUMBAR SPINE - COMPLETE 4+ VIEW COMPARISON:  07/20/2010 CT FINDINGS: The anterior superior corner of the L1 vertebral body on the lateral view is excluded but is identified on the frontal and oblique projections. Anterior osteophyte noted off the anterior superior aspect of L2. No acute appearing fracture, pars defects or spondylolisthesis. No significant disc space narrowing. IMPRESSION: Mild degenerative spurring off the anterior superior corner of L2. No significant disc space flattening, pars defects or fracture. Electronically Signed   By: Ashley Royalty M.D.   On:  03/21/2017 22:30    Procedures Procedures (including critical care time)  Medications Ordered in ED Medications  HYDROcodone-acetaminophen (NORCO/VICODIN) 5-325 MG per tablet 1 tablet (1 tablet Oral Given 03/21/17 2205)  methocarbamol (ROBAXIN) tablet 500 mg (500 mg Oral Given 03/21/17 2205)     Initial Impression / Assessment and Plan / ED Course  I have reviewed the triage vital signs and the nursing notes.  Pertinent labs & imaging results that were available during my care of the patient were reviewed by me and considered in my medical decision making (see chart for details).  46 year old male presents with atraumatic right sided back and hip pain with radicular symptoms. No signs of cauda equina or other neurosurgical emergency. Lumbar xray is negative. Pain was managed in the ED. He was given rx for Ibuprofen, Gabapentin, Flexeril and advised to f/u with PCP. Return precautions were given.  Final Clinical Impressions(s) /  ED Diagnoses   Final diagnoses:  Acute right-sided low back pain with right-sided sciatica  Right hip pain    New Prescriptions New Prescriptions   No medications on file     Iris Pert 03/21/17 2244    Davonna Belling, MD 03/22/17 [phone]

## 2017-03-21 NOTE — ED Notes (Signed)
Declined W/C at D/C and was escorted to lobby by RN. 

## 2017-03-21 NOTE — Discharge Instructions (Signed)
Take Ibuprofen three times daily for the next week. Take this medicine with food. Take muscle relaxer at bedtime to help you sleep. This medicine makes you drowsy so do not take before driving or work Take Gabapentin three times a day Use a heating pad for sore muscles - use for 20 minutes several times a day Follow up with your doctor

## 2017-03-21 NOTE — ED Triage Notes (Signed)
The pt has had rt hip pain  That goes down his leg and up higher into his rt back for 3 days no known injury  He works lifting heavy objects

## 2017-05-17 ENCOUNTER — Emergency Department (HOSPITAL_COMMUNITY): Payer: Self-pay

## 2017-05-17 ENCOUNTER — Encounter (HOSPITAL_COMMUNITY): Payer: Self-pay | Admitting: Emergency Medicine

## 2017-05-17 ENCOUNTER — Other Ambulatory Visit: Payer: Self-pay

## 2017-05-17 ENCOUNTER — Emergency Department (HOSPITAL_COMMUNITY)
Admission: EM | Admit: 2017-05-17 | Discharge: 2017-05-17 | Disposition: A | Discharge status: Home / Self Care | Payer: Self-pay | Attending: Emergency Medicine | Admitting: Emergency Medicine

## 2017-05-17 DIAGNOSIS — E119 Type 2 diabetes mellitus without complications: Secondary | ICD-10-CM | POA: Insufficient documentation

## 2017-05-17 DIAGNOSIS — B9711 Coxsackievirus as the cause of diseases classified elsewhere: Secondary | ICD-10-CM | POA: Insufficient documentation

## 2017-05-17 DIAGNOSIS — J028 Acute pharyngitis due to other specified organisms: Secondary | ICD-10-CM | POA: Insufficient documentation

## 2017-05-17 DIAGNOSIS — Z7984 Long term (current) use of oral hypoglycemic drugs: Secondary | ICD-10-CM | POA: Insufficient documentation

## 2017-05-17 DIAGNOSIS — Z79899 Other long term (current) drug therapy: Secondary | ICD-10-CM | POA: Insufficient documentation

## 2017-05-17 DIAGNOSIS — Z87891 Personal history of nicotine dependence: Secondary | ICD-10-CM | POA: Insufficient documentation

## 2017-05-17 DIAGNOSIS — L03211 Cellulitis of face: Secondary | ICD-10-CM | POA: Insufficient documentation

## 2017-05-17 DIAGNOSIS — B085 Enteroviral vesicular pharyngitis: Secondary | ICD-10-CM

## 2017-05-17 LAB — I-STAT CHEM 8, ED
BUN: 5 mg/dL — AB (ref 6–20)
CALCIUM ION: 1.08 mmol/L — AB (ref 1.15–1.40)
CHLORIDE: 100 mmol/L — AB (ref 101–111)
Creatinine, Ser: 0.7 mg/dL (ref 0.61–1.24)
GLUCOSE: 139 mg/dL — AB (ref 65–99)
HCT: 49 % (ref 39.0–52.0)
Hemoglobin: 16.7 g/dL (ref 13.0–17.0)
Potassium: 3.7 mmol/L (ref 3.5–5.1)
Sodium: 136 mmol/L (ref 135–145)
TCO2: 27 mmol/L (ref 22–32)

## 2017-05-17 LAB — CBC WITH DIFFERENTIAL/PLATELET
Basophils Absolute: 0 10*3/uL (ref 0.0–0.1)
Basophils Relative: 0 %
EOS ABS: 0.1 10*3/uL (ref 0.0–0.7)
EOS PCT: 1 %
HEMATOCRIT: 46.4 % (ref 39.0–52.0)
Hemoglobin: 15.9 g/dL (ref 13.0–17.0)
LYMPHS ABS: 2.4 10*3/uL (ref 0.7–4.0)
Lymphocytes Relative: 25 %
MCH: 31.9 pg (ref 26.0–34.0)
MCHC: 34.3 g/dL (ref 30.0–36.0)
MCV: 93 fL (ref 78.0–100.0)
MONOS PCT: 6 %
Monocytes Absolute: 0.6 10*3/uL (ref 0.1–1.0)
Neutro Abs: 6.4 10*3/uL (ref 1.7–7.7)
Neutrophils Relative %: 68 %
Platelets: 188 10*3/uL (ref 150–400)
RBC: 4.99 MIL/uL (ref 4.22–5.81)
RDW: 13 % (ref 11.5–15.5)
WBC: 9.5 10*3/uL (ref 4.0–10.5)

## 2017-05-17 MED ORDER — CLINDAMYCIN HCL 300 MG PO CAPS: 300.0000 mg | ORAL_CAPSULE | Freq: Four times a day (QID) | ORAL | 0 refills | Status: AC

## 2017-05-17 MED ORDER — MAGIC MOUTHWASH: 5.0000 mL | Freq: Every day | ORAL | 0 refills | Status: DC | PRN

## 2017-05-17 MED ORDER — IOPAMIDOL (ISOVUE-300) INJECTION 61%
INTRAVENOUS | Status: AC
  Administered 2017-05-17: 75 mL
  Filled 2017-05-17: qty 75

## 2017-05-17 MED ORDER — SODIUM CHLORIDE 0.9 % IV BOLUS (SEPSIS)
1000.0000 mL | Freq: Once | INTRAVENOUS | Status: AC
  Administered 2017-05-17: 1000 mL via INTRAVENOUS

## 2017-05-17 NOTE — ED Notes (Signed)
Pt stable and ambulatory for discharge. States understanding follow up

## 2017-05-17 NOTE — ED Triage Notes (Signed)
Pt states for the last 2 days he has been having swelling in his left ear that goes into left side of face under eye. Pt also has sore throat with white spots in throat. No lip or tongue swelling. No sob.

## 2017-05-17 NOTE — ED Provider Notes (Signed)
[place] EMERGENCY DEPARTMENT Provider Note   CSN: [id number] Arrival date & time: 05/17/17  1439     History   Chief Complaint Chief Complaint  Patient presents with  . Facial Swelling    HPI Logan Weiss is a 46 y.o. male.  HPI   Patient is a 46 year old male with a history of type 2 diabetes mellitus and history of MRSA infection presenting for left ear and facial swelling times 3-4 days.  Patient reports that he will currently has this for the past decade.  Patient reports that it always resolves antibiotics but then returns.  Patient reports that this facial swelling has presented on the right before as well.  Patient denies any involvement of his vision or orbit.  Patient denies any decrease in hearing.  Patient denies any fever or chills at home.  Patient does report that the left side of his neck feels tender to touch.  Additionally, patient has had a sore throat and noticed that there are lesions in the posterior pharynx.  No dental pain.  No facial tenderness to palpation.  Patient also noticed that he gets diffuse joint pain with his symptoms.  Past Medical History:  Diagnosis Date  . Diabetes [facility])    new dx    Patient Active Problem List   Diagnosis Date Noted  . Type 2 diabetes mellitus with diabetic dermatitis ([place]) 06/08/2014  . History of MRSA infection 06/08/2014    Past Surgical History:  Procedure Laterality Date  . APPENDECTOMY    . right knee surgery         Home Medications    Prior to Admission medications   Medication Sig Start Date End Date Taking? Authorizing Provider  butalbital-acetaminophen-caffeine (FIORICET, ESGIC) [phone] MG tablet Take 1 tablet by mouth every 6 (six) hours as needed for headache. 11/20/16 11/20/17  Virgel Manifold, MD  cephALEXin (KEFLEX) 500 MG capsule Take 1 capsule (500 mg total) by mouth 4 (four) times daily. 01/17/17   Clayton Bibles, PA-C  clindamycin (CLEOCIN) 300 MG capsule Take 1 capsule  (300 mg total) 4 (four) times daily for 10 days by mouth. 05/17/17 05/27/17  Langston Masker B, PA-C  cyclobenzaprine (FLEXERIL) 10 MG tablet Take 1 tablet (10 mg total) by mouth 2 (two) times daily as needed for muscle spasms. 03/21/17   Recardo Evangelist, PA-C  doxycycline (VIBRA-TABS) 100 MG tablet Take 100 mg by mouth 2 (two) times daily.    [provider]  furosemide (LASIX) 20 MG tablet Take 20 mg by mouth daily.    [provider]  gabapentin (NEURONTIN) 300 MG capsule Take 1 capsule (300 mg total) by mouth 3 (three) times daily. 03/21/17   Recardo Evangelist, PA-C  glipiZIDE (GLUCOTROL) 5 MG tablet Take 1 tablet (5 mg total) by mouth daily before breakfast. 10/10/16   Caudill, Gwynneth Aliment, MD  GlucoCom Lancets MISC Check blood sugar TID & QHS 06/22/14   Advani, Vernon Prey, MD  glucose blood (CHOICE DM FORA G20 TEST STRIPS) test strip Use as instructed 06/22/14   Advani, Vernon Prey, MD  glucose monitoring kit (FREESTYLE) monitoring kit 1 each by Does not apply route as needed for other. 06/22/14   Lorayne Marek, MD  HYDROcodone-acetaminophen (NORCO) 10-325 MG per tablet Take 1 tablet by mouth every 6 (six) hours as needed.    [provider]  ibuprofen (ADVIL,MOTRIN) 600 MG tablet Take 1 tablet (600 mg total) by mouth every 6 (six) hours as needed. 03/21/17  Recardo Evangelist, PA-C  Ibuprofen-Diphenhydramine Cit (ADVIL PM PO) Take 2 capsules by mouth every 4 (four) hours as needed (pain).    [provider]  magic mouthwash SOLN Take 5 mLs daily as needed by mouth for mouth pain. 05/17/17   Langston Masker B, PA-C  metFORMIN (GLUCOPHAGE) 500 MG tablet Take 1 tablet (500 mg total) by mouth 2 (two) times daily with a meal. 06/05/14   Le, Thao P, DO    Family History Family History  Problem Relation Age of Onset  . Hyperlipidemia Mother   . Mental retardation Mother   . Diabetes Mother   . Diabetes Father   . Hyperlipidemia Father   . Hypertension Father     . Diabetes Maternal Grandmother   . Diabetes Maternal Grandfather   . Diabetes Paternal Grandmother   . Diabetes Paternal Grandfather     Social History Social History   Tobacco Use  . Smoking status: Former Smoker    Packs/day: 0.00    Years: 5.00    Pack years: 0.00  . Smokeless tobacco: Never Used  Substance Use Topics  . Alcohol use: No    Alcohol/week: 0.0 oz    Comment: socially  . Drug use: No     Allergies   Patient has no known allergies.   Review of Systems Review of Systems  Constitutional: Negative for chills and fever.  HENT: Positive for facial swelling and sore throat. Negative for congestion, rhinorrhea and voice change.   Eyes: Negative for visual disturbance.  Respiratory: Negative for shortness of breath and wheezing.   Skin: Positive for color change.  All other systems reviewed and are negative.    Physical Exam Updated Vital Signs BP 124/77 Comment: Simultaneous filing. User may not have seen previous data.  Pulse 88   Temp 98 F (36.7 C) (Oral)   Resp 16   SpO2 97%   Physical Exam  Constitutional: He appears well-developed and well-nourished. No distress.  HENT:  Head: Normocephalic and atraumatic.  Left-sided facial edema and erythema present.  This extends to the left auricle.  There is no focus of induration or fluctuance. Normal phonation. No muffled voice sounds. Patient swallows secretions without difficulty. Dentition poor. No lesions of tongue or buccal mucosa. Uvula midline. No asymmetric swelling of the posterior pharynx. Erythema of posterior pharynx and multiple vesicular lesions scattered over soft palate. No tonsillar exuduate. No lingual swelling. No induration inferior to tongue. No submandibular tenderness, swelling, or induration.  Tenderness to palpation of left side of neck. No cervical lymphadenopathy. Right TM without erythema or effusion; left TM without erythema or effusion.  No lesions in bilateral auditory  canals.  Eyes: Conjunctivae and EOM are normal. Pupils are equal, round, and reactive to light.  Neck: Normal range of motion. Neck supple.  Cardiovascular: Normal rate, regular rhythm, S1 normal and S2 normal.  No murmur heard. Pulmonary/Chest: Effort normal and breath sounds normal. He has no wheezes. He has no rales.  Abdominal: Soft. He exhibits no distension.  Musculoskeletal: Normal range of motion. He exhibits no edema or deformity.  Lymphadenopathy:    He has no cervical adenopathy.  Neurological: He is alert.  Patient was intranasal good coordination is medically.Cranial nerves grossly intact.   Skin: Skin is warm and dry. There is erythema.  Psychiatric: He has a normal mood and affect. His behavior is normal. Judgment and thought content normal.  Nursing note and vitals reviewed.    ED Treatments / Results  Labs (  all labs ordered are listed, but only abnormal results are displayed) Labs Reviewed  I-STAT CHEM 8, ED - Abnormal; Notable for the following components:      Result Value   Chloride 100 (*)    BUN 5 (*)    Glucose, Bld 139 (*)    Calcium, Ion 1.08 (*)    All other components within normal limits  CBC WITH DIFFERENTIAL/PLATELET    EKG  EKG Interpretation None       Radiology Ct Soft Tissue Neck W Contrast  Result Date: 05/17/2017 CLINICAL DATA:  Initial evaluation for swelling in the left ear extending into left face, sore throat. EXAM: CT NECK WITH CONTRAST TECHNIQUE: Multidetector CT imaging of the neck was performed using the standard protocol following the bolus administration of intravenous contrast. CONTRAST:  85m ISOVUE-300 IOPAMIDOL (ISOVUE-300) INJECTION 61% COMPARISON:  None available. FINDINGS: Pharynx and larynx: Oral cavity within normal limits without mass lesion or loculated fluid collection. No acute abnormality about the remaining dentition. Palatine tonsils symmetric and within normal limits. Parapharyngeal fat preserved. Nasopharynx  within normal limits. Retropharyngeal soft tissues within normal limits. Epiglottis normal. Vallecula clear. Small amount of layering secretions noted within the hypopharynx. Remainder of the hypopharynx and supraglottic larynx within normal limits. True cords symmetric and normal. Subglottic airway bowed to the right and clear. Salivary glands: Salivary glands including the parotid and submandibular glands are within normal limits. There is asymmetric soft tissue stranding with mild swelling within the left face, extending from the left the ear/pre-auricular region anteriorly and inferiorly towards the left mandibular body and submental region. Associated asymmetric skin thickening, most prominent inferiorly (series 3, image 54). Involvement of the left postauricular soft tissues as well. The underlying left parotid gland is normal without evidence for acute parotitis. No other significant inflammatory changes within the left masticator space. Retro or antral and parapharyngeal fat maintained without extension of inflammatory changes into the deeper spaces of the neck. No extension into the left orbit. Findings are indeterminate, but could reflect sequelae of acute cellulitis. No abscess or discrete collections identified. Thyroid: Thyroid within normal limits. Lymph nodes: No pathologically enlarged lymph nodes identified within the neck. Mildly prominent submental nodes measuring up to 1 cm noted. Few subcentimeter hyperenhancing ill-defined postauricular nodes noted on the left, likely reactive. Vascular: Normal intravascular enhancement seen throughout the neck. Limited intracranial: Unremarkable. Visualized orbits: Visualized globes and orbital soft tissues within normal limits. Mastoids and visualized paranasal sinuses: Mild scattered mucosal thickening within the ethmoidal air cells. Paranasal sinuses otherwise clear. Left mastoid air cells and middle ear cavity are largely clear. Scattered opacity within  the right middle air cavity with right mastoid effusion noted. Skeleton: No acute osseous abnormality. No worrisome lytic or blastic osseous lesions. 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 Upper chest: Visualized upper chest within normal limits. The partially visualized lungs are clear. Other: None. IMPRESSION: 1. Mild diffuse hazy soft tissue stranding with overlying skin thickening within the left pre and postauricular region, extending anteriorly and inferiorly into the left face towards the left mandible with overlying skin thickening. Findings are nonspecific, but could reflect sequelae of acute cellulitis. No discrete abscess or drainable fluid collection. No evidence for acute sialoadenitis involving the underlying left parotid gland. No intraorbital extension or other concerning features. No imaging findings to suggest associated/concomitant left-sided otomastoiditis. 2. Right mastoid effusion with scattered opacity within the right middle ear cavity. While this finding may be benign in reactive in nature to prior infectious  or inflammatory process, possible acute otomastoiditis not excluded. Correlation physical exam recommended. 3. 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 Electronically Signed   By: Jeannine Boga M.D.   On: 05/17/2017 19:40    Procedures Procedures (including critical care time)  Medications Ordered in ED Medications  sodium chloride 0.9 % bolus 1,000 mL (0 mLs Intravenous Stopped 05/17/17 2000)  iopamidol (ISOVUE-300) 61 % injection (75 mLs  Contrast Given 05/17/17 1849)     Initial Impression / Assessment and Plan / ED Course  I have reviewed the triage vital signs and the nursing notes.  Pertinent labs & imaging results that were available during my care of the patient were reviewed by me and considered in my medical decision making (see chart for details).      Final Clinical Impressions(s) / ED Diagnoses   Final  diagnoses:  Facial cellulitis  Pharyngitis due to Coxsackie virus   Patient is nontoxic-appearing, afebrile, and in no acute distress.  Patient is phonating well, tolerating secretions and has no respiratory distress or stridor.  Differential diagnosis includes facial cellulitis, erysipelas, sinus arthrosis, thoracic outlet syndrome, thrombosis of IJ.  Case discussed with Dr. Vivi Martens.  Patient will have CT maxilla facial as well as CT soft tissue neck, CBC with differential, and BMP.  CT demonstrated, get a cellulitis with no abscess.  No extension down into the soft tissues of the neck.  There is a question of fluid collection in the right mastoid.  This does not clinically correlate with pain.  I do not suspect mastoiditis.  Patient symptoms are entirely left-sided and there is no mastoid tenderness bilaterally.  Patient to be discharged with kanamycin prescription as well as Magic mouthwash for herpangina due to coxsackie pharyngitis.  Return precautions given for any increasing swelling, fevers, chills, purulent drainage from the facial cellulitis, involvement of the orbit.  Patient is understanding agrees a plan of care.  This is a shared visit with Dr. Vivi Martens. Patient was independently evaluated by this attending physician. Attending physician consulted in evaluation and discharge management.  ED Discharge Orders        Ordered    clindamycin (CLEOCIN) 300 MG capsule  4 times daily     05/17/17 2030    magic mouthwash SOLN  Daily PRN     05/17/17 2046       Albesa Seen, [place] 05/17/17 2049

## 2017-05-17 NOTE — ED Provider Notes (Signed)
Medical screening examination/treatment/procedure(s) were conducted as a shared visit with non-physician practitioner(s) and myself.  I personally evaluated the patient during the encounter.   EKG Interpretation None     46 year old male presents with several days of left sided facial swelling.  CT consistent with cellulitis.  Will be given oral antibiotics and return precautions   Lorre NickAllen, Felisha Claytor, MD 05/17/17 2018

## 2017-05-17 NOTE — ED Notes (Signed)
Patient transported to CT 

## 2017-05-17 NOTE — Discharge Instructions (Signed)
Please see the information and instructions below regarding your visit.  Your diagnoses today include:  1. Facial cellulitis    Cellulitis is a superficial skin infection. Please take your antibiotics as prescribed for their ENTIRE prescribed duration.   Tests performed today include: See side panel of your discharge paperwork for testing performed today. Vital signs are listed at the bottom of these instructions.   Medications prescribed:    Take any prescribed medications only as prescribed, and any over the counter medications only as directed on the packaging.  1. Please take all of your antibiotics until finished.   You may develop abdominal discomfort or nausea from the antibiotic. If this occurs, you may take it with food. Some patients also get diarrhea with antibiotics. You may help offset this with probiotics which you can buy or get in yogurt. Do not eat or take the probiotics until 2 hours after your antibiotic.  Some people develop allergies to antibiotics. Symptoms of antibiotic allergy can be mild and include a flat rash and itching. They can also be more serious and include:  ?Hives - Hives are raised, red patches of skin that are usually very itchy.  ?Lip or tongue swelling  ?Trouble swallowing or breathing  ?Blistering of the skin or mouth.  If you have any of these serious symptoms, please seek emergency medical care immediately.  Home care instructions:  Please follow any educational materials contained in this packet.   Wash area gently twice a day with warm soapy water. Do not apply alcohol or hydrogen peroxide. Cover the area if it draining or weeping.   Follow-up instructions: Please follow-up with your primary care provider or the ED in 48 hours for a check of the infection if symptoms are not improving.   Return instructions:  Please return to the Emergency Department if you experience worsening symptoms. Call your doctor sooner or return to the ER if  you develop worsening signs of infection such as: increased redness, increased pain, pus, fever, or other symptoms that concern you.  Please return if you have any other emergent concerns.  Additional Information:   Your vital signs today were: BP 127/77    Pulse 88    Temp 98 F (36.7 C) (Oral)    Resp 16    SpO2 97%  If your blood pressure (BP) was elevated on multiple readings during this visit above 130 for the top number or above 80 for the bottom number, please have this repeated by your primary care provider within one month. --------------  Thank you for allowing us to participate in your care today. It was a pleasure taking care of you today!

## 2017-05-17 NOTE — ED Notes (Signed)
See provider assessment 

## 2017-07-28 ENCOUNTER — Other Ambulatory Visit: Payer: Self-pay

## 2017-07-28 ENCOUNTER — Encounter (HOSPITAL_COMMUNITY): Payer: Self-pay | Admitting: Emergency Medicine

## 2017-07-28 ENCOUNTER — Emergency Department (HOSPITAL_COMMUNITY): Payer: BLUE CROSS/BLUE SHIELD

## 2017-07-28 ENCOUNTER — Emergency Department (HOSPITAL_COMMUNITY)
Admission: EM | Admit: 2017-07-28 | Discharge: 2017-07-29 | Disposition: A | Discharge status: Home / Self Care | Payer: BLUE CROSS/BLUE SHIELD | Attending: Emergency Medicine | Admitting: Emergency Medicine

## 2017-07-28 DIAGNOSIS — Z79899 Other long term (current) drug therapy: Secondary | ICD-10-CM | POA: Diagnosis not present

## 2017-07-28 DIAGNOSIS — Z7984 Long term (current) use of oral hypoglycemic drugs: Secondary | ICD-10-CM | POA: Diagnosis not present

## 2017-07-28 DIAGNOSIS — R1084 Generalized abdominal pain: Secondary | ICD-10-CM | POA: Diagnosis not present

## 2017-07-28 DIAGNOSIS — E876 Hypokalemia: Secondary | ICD-10-CM

## 2017-07-28 DIAGNOSIS — R739 Hyperglycemia, unspecified: Secondary | ICD-10-CM

## 2017-07-28 DIAGNOSIS — E1165 Type 2 diabetes mellitus with hyperglycemia: Secondary | ICD-10-CM | POA: Diagnosis not present

## 2017-07-28 DIAGNOSIS — Z87891 Personal history of nicotine dependence: Secondary | ICD-10-CM | POA: Insufficient documentation

## 2017-07-28 DIAGNOSIS — R079 Chest pain, unspecified: Secondary | ICD-10-CM | POA: Diagnosis present

## 2017-07-28 LAB — CBC
HEMATOCRIT: 44.8 % (ref 39.0–52.0)
Hemoglobin: 15.2 g/dL (ref 13.0–17.0)
MCH: 30.9 pg (ref 26.0–34.0)
MCHC: 33.9 g/dL (ref 30.0–36.0)
MCV: 91.1 fL (ref 78.0–100.0)
Platelets: 187 10*3/uL (ref 150–400)
RBC: 4.92 MIL/uL (ref 4.22–5.81)
RDW: 12.6 % (ref 11.5–15.5)
WBC: 9.4 10*3/uL (ref 4.0–10.5)

## 2017-07-28 LAB — BASIC METABOLIC PANEL
Anion gap: 13 (ref 5–15)
BUN: 6 mg/dL (ref 6–20)
CHLORIDE: 98 mmol/L — AB (ref 101–111)
CO2: 25 mmol/L (ref 22–32)
Calcium: 9.1 mg/dL (ref 8.9–10.3)
Creatinine, Ser: 0.82 mg/dL (ref 0.61–1.24)
GFR calc non Af Amer: >60 mL/min (ref >60)
Glucose, Bld: 271 mg/dL — ABNORMAL HIGH (ref 65–99)
POTASSIUM: 3 mmol/L — AB (ref 3.5–5.1)
SODIUM: 136 mmol/L (ref 135–145)

## 2017-07-28 LAB — I-STAT TROPONIN, ED: Troponin i, poc: 0 ng/mL (ref 0.00–0.08)

## 2017-07-28 LAB — CBG MONITORING, ED: Glucose-Capillary: 232 mg/dL — ABNORMAL HIGH (ref 65–99)

## 2017-07-28 NOTE — ED Triage Notes (Signed)
Reports central chest tightness that started two days ago as well as abdominal pain.  Denies any n/v.  Also endorses low back pain.  Also c/o elevated blood sugars.  CBG-232 in triage.

## 2017-07-29 LAB — CBG MONITORING, ED
GLUCOSE-CAPILLARY: 290 mg/dL — AB (ref 65–99)
GLUCOSE-CAPILLARY: 249 mg/dL — AB (ref 65–99)

## 2017-07-29 MED ORDER — SODIUM CHLORIDE 0.9 % IV BOLUS (SEPSIS)
2000.0000 mL | Freq: Once | INTRAVENOUS | Status: AC
  Administered 2017-07-29: 2000 mL via INTRAVENOUS

## 2017-07-29 MED ORDER — NAPROXEN 375 MG PO TABS: 375.0000 mg | ORAL_TABLET | Freq: Two times a day (BID) | ORAL | 0 refills | Status: DC

## 2017-07-29 MED ORDER — POTASSIUM CHLORIDE CRYS ER 20 MEQ PO TBCR
40.0000 meq | EXTENDED_RELEASE_TABLET | Freq: Once | ORAL | Status: AC
  Administered 2017-07-29: 40 meq via ORAL
  Filled 2017-07-29: qty 2

## 2017-07-29 MED ORDER — POTASSIUM CHLORIDE CRYS ER 20 MEQ PO TBCR: 20.0000 meq | EXTENDED_RELEASE_TABLET | Freq: Two times a day (BID) | ORAL | 0 refills | Status: DC

## 2017-07-29 MED ORDER — KETOROLAC TROMETHAMINE 15 MG/ML IJ SOLN
15.0000 mg | Freq: Once | INTRAMUSCULAR | Status: AC
  Administered 2017-07-29: 15 mg via INTRAVENOUS
  Filled 2017-07-29: qty 1

## 2017-07-29 NOTE — ED Provider Notes (Signed)
[facility] EMERGENCY DEPARTMENT Provider Note   CSN: [id number] Arrival date & time: 07/28/17  2055     History   Chief Complaint Chief Complaint  Patient presents with  . Chest Pain  . Hyperglycemia  . Abdominal Pain    HPI Logan Weiss is a 47 y.o. male.  Patient with history of T2DM presents with high blood sugar, chest and abdominal pain. He reports nocturia and thirst. No nausea or vomiting. No recent change in diet or medications. He states he is compliant. He has been exercising. The chest pain is constant, center of chest without modifying factors, which started 3 days ago. No cough, SOB. His abdominal pain is diffuse and transient. No fever, change in bowel movements or dysuria.   The history is provided by a parent and the patient. No language interpreter was used.  Chest Pain   Associated symptoms include abdominal pain. Pertinent negatives include no cough, no fever, no nausea, no shortness of breath and no vomiting.  Hyperglycemia  Associated symptoms: abdominal pain and chest pain   Associated symptoms: no dysuria, no fever, no increased thirst, no nausea, no polyuria, no shortness of breath and no vomiting   Abdominal Pain   Pertinent negatives include fever, nausea, vomiting and dysuria.    Past Medical History:  Diagnosis Date  . Diabetes [facility])    new dx    Patient Active Problem List   Diagnosis Date Noted  . 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 History of MRSA infection 06/08/2014    Past Surgical History:  Procedure Laterality Date  . APPENDECTOMY    . right knee surgery         Home Medications    Prior to Admission medications   Medication Sig Start Date End Date Taking? Authorizing Provider  butalbital-acetaminophen-caffeine (FIORICET, ESGIC) [phone] MG tablet Take 1 tablet by mouth every 6 (six) hours as needed for headache. 11/20/16 11/20/17  Virgel Manifold, MD  cephALEXin  (KEFLEX) 500 MG capsule Take 1 capsule (500 mg total) by mouth 4 (four) times daily. 01/17/17   Clayton Bibles, PA-C  cyclobenzaprine (FLEXERIL) 10 MG tablet Take 1 tablet (10 mg total) by mouth 2 (two) times daily as needed for muscle spasms. 03/21/17   Recardo Evangelist, PA-C  doxycycline (VIBRA-TABS) 100 MG tablet Take 100 mg by mouth 2 (two) times daily.    [provider]  furosemide (LASIX) 20 MG tablet Take 20 mg by mouth daily.    [provider]  gabapentin (NEURONTIN) 300 MG capsule Take 1 capsule (300 mg total) by mouth 3 (three) times daily. 03/21/17   Recardo Evangelist, PA-C  glipiZIDE (GLUCOTROL) 5 MG tablet Take 1 tablet (5 mg total) by mouth daily before breakfast. 10/10/16   Caudill, Gwynneth Aliment, MD  GlucoCom Lancets MISC Check blood sugar TID & QHS 06/22/14   Advani, Vernon Prey, MD  glucose blood (CHOICE DM FORA G20 TEST STRIPS) test strip Use as instructed 06/22/14   Advani, Vernon Prey, MD  glucose monitoring kit (FREESTYLE) monitoring kit 1 each by Does not apply route as needed for other. 06/22/14   Lorayne Marek, MD  HYDROcodone-acetaminophen (NORCO) 10-325 MG per tablet Take 1 tablet by mouth every 6 (six) hours as needed.    [provider]  ibuprofen (ADVIL,MOTRIN) 600 MG tablet Take 1 tablet (600 mg total) by mouth every 6 (six) hours as needed. 03/21/17   Recardo Evangelist, PA-C  Ibuprofen-Diphenhydramine Cit (ADVIL  PM PO) Take 2 capsules by mouth every 4 (four) hours as needed (pain).    [provider]  magic mouthwash SOLN Take 5 mLs daily as needed by mouth for mouth pain. 05/17/17   Langston Masker B, PA-C  metFORMIN (GLUCOPHAGE) 500 MG tablet Take 1 tablet (500 mg total) by mouth 2 (two) times daily with a meal. 06/05/14   Le, Thao P, DO    Family History Family History  Problem Relation Age of Onset  . Hyperlipidemia Mother   . Mental retardation Mother   . Diabetes Mother   . Diabetes Father   . Hyperlipidemia Father   .  Hypertension Father   . Diabetes Maternal Grandmother   . Diabetes Maternal Grandfather   . Diabetes Paternal Grandmother   . Diabetes Paternal Grandfather     Social History Social History   Tobacco Use  . Smoking status: Former Smoker    Packs/day: 0.00    Years: 5.00    Pack years: 0.00  . Smokeless tobacco: Never Used  Substance Use Topics  . Alcohol use: No    Alcohol/week: 0.0 oz    Comment: socially  . Drug use: No     Allergies   Doxycycline   Review of Systems Review of Systems  Constitutional: Negative for chills and fever.  HENT: Negative.   Respiratory: Negative.  Negative for cough and shortness of breath.   Cardiovascular: Positive for chest pain.  Gastrointestinal: Positive for abdominal pain. Negative for nausea and vomiting.  Endocrine: Negative for polydipsia and polyuria.  Genitourinary: Negative for dysuria and flank pain.  Musculoskeletal: Negative.   Skin: Negative.   Neurological: Negative.      Physical Exam Updated Vital Signs BP 129/74   Pulse 72   Temp 97.9 F (36.6 C) (Oral)   Resp 19   Ht '5\' 6"'$  (1.676 m)   Wt 86.2 kg (190 lb)   SpO2 95%   BMI 30.67 kg/m   Physical Exam  Constitutional: He is oriented to person, place, and time. He appears well-developed and well-nourished.  HENT:  Head: Normocephalic.  Neck: Normal range of motion. Neck supple.  Cardiovascular: Normal rate and regular rhythm.  Pulmonary/Chest: Effort normal and breath sounds normal.  Abdominal: Soft. Bowel sounds are normal. He exhibits no mass. There is tenderness (Diffuse tenderness to soft abdomen.). There is no rebound and no guarding.  Musculoskeletal: Normal range of motion.  Neurological: He is alert and oriented to person, place, and time.  Skin: Skin is warm and dry. No rash noted.  Psychiatric: He has a normal mood and affect.  Nursing note and vitals reviewed.    ED Treatments / Results  Labs (all labs ordered are listed, but only  abnormal results are displayed) Labs Reviewed  BASIC METABOLIC PANEL - Abnormal; Notable for the following components:      Result Value   Potassium 3.0 (*)    Chloride 98 (*)    Glucose, Bld 271 (*)    All other components within normal limits  CBG MONITORING, ED - Abnormal; Notable for the following components:   Glucose-Capillary 232 (*)    All other components within normal limits  CBG MONITORING, ED - Abnormal; Notable for the following components:   Glucose-Capillary 290 (*)    All other components within normal limits  CBG MONITORING, ED - Abnormal; Notable for the following components:   Glucose-Capillary 249 (*)    All other components within normal limits  CBC  I-STAT TROPONIN,  ED    EKG  EKG Interpretation  Date/Time:  Sunday July 28 2017 20:59:43 EST Ventricular Rate:  81 PR Interval:  148 QRS Duration: 82 QT Interval:  338 QTC Calculation: 392 R Axis:   2 Text Interpretation:  Normal sinus rhythm Normal ECG No significant change was found Confirmed by Ezequiel Essex [phone]) on 07/29/2017 3:23:33 AM       Radiology Dg Chest 2 View  Result Date: 07/28/2017 CLINICAL DATA:  Chest pain for 2 days. EXAM: CHEST  2 VIEW COMPARISON:  February 23, 2006 FINDINGS: The heart size and mediastinal contours are within normal limits. Both lungs are clear. The visualized skeletal structures are unremarkable. IMPRESSION: No active cardiopulmonary disease. Electronically Signed   By: Dorise Bullion III M.D   On: 07/28/2017 21:20    Procedures Procedures (including critical care time)  Medications Ordered in ED Medications  sodium chloride 0.9 % bolus 2,000 mL (0 mLs Intravenous Stopped 07/29/17 0309)  potassium chloride SA (K-DUR,KLOR-CON) CR tablet 40 mEq (40 mEq Oral Given 07/29/17 2957)     Initial Impression / Assessment and Plan / ED Course  I have reviewed the triage vital signs and the nursing notes.  Pertinent labs & imaging results that were available during  my care of the patient were reviewed by me and considered in my medical decision making (see chart for details).     Patient with history of T2DM on glipizide daily and reporting compliance here with elevated blood sugar for the past week. No evidence of acidosis. Patient appears comfortable. VSS.   He is given 2 liters of fluids with minimal improvement. Discussed with Dr. Wyvonnia Dusky. No medication changes required tonight. Follow up with PCP this week stressed.  Chest pain has been constant for 3 days. No cough, fever, SOB. Troponin and EKG are negative for evidence of ACS. Symptoms atypical.   Abdominal pain is diffuse and migrating. No concern for obstruction, infection, appendicitis, perforation or pancreatitis.   He can be discharged home. Strongly encouraged PCP follow up for recheck of CBG and pain symptoms.   Final Clinical Impressions(s) / ED Diagnoses   Final diagnoses:  None   1. Hyperglycemia 2. Nonspecific chest pain 3. Nonspecific abdominal pain 4. Hypokalemia  ED Discharge Orders    None       Charlann Lange, PA-C 07/29/17 4734    Ezequiel Essex, MD 07/29/17 [phone]

## 2017-08-05 ENCOUNTER — Encounter (HOSPITAL_COMMUNITY): Payer: Self-pay | Admitting: *Deleted

## 2017-08-05 ENCOUNTER — Other Ambulatory Visit: Payer: Self-pay

## 2017-08-05 ENCOUNTER — Emergency Department (HOSPITAL_COMMUNITY): Payer: BLUE CROSS/BLUE SHIELD

## 2017-08-05 ENCOUNTER — Emergency Department (HOSPITAL_COMMUNITY)
Admission: EM | Admit: 2017-08-05 | Discharge: 2017-08-06 | Disposition: A | Discharge status: Home / Self Care | Payer: BLUE CROSS/BLUE SHIELD | Attending: Emergency Medicine | Admitting: Emergency Medicine

## 2017-08-05 DIAGNOSIS — Z87891 Personal history of nicotine dependence: Secondary | ICD-10-CM | POA: Diagnosis not present

## 2017-08-05 DIAGNOSIS — M79674 Pain in right toe(s): Secondary | ICD-10-CM | POA: Diagnosis present

## 2017-08-05 DIAGNOSIS — E119 Type 2 diabetes mellitus without complications: Secondary | ICD-10-CM | POA: Insufficient documentation

## 2017-08-05 DIAGNOSIS — Z79899 Other long term (current) drug therapy: Secondary | ICD-10-CM | POA: Diagnosis not present

## 2017-08-05 DIAGNOSIS — Z7984 Long term (current) use of oral hypoglycemic drugs: Secondary | ICD-10-CM | POA: Diagnosis not present

## 2017-08-05 DIAGNOSIS — L03031 Cellulitis of right toe: Secondary | ICD-10-CM | POA: Diagnosis not present

## 2017-08-05 LAB — CBC WITH DIFFERENTIAL/PLATELET
Basophils Absolute: 0 10*3/uL (ref 0.0–0.1)
Basophils Relative: 0 %
Eosinophils Absolute: 0.2 10*3/uL (ref 0.0–0.7)
Eosinophils Relative: 3 %
HCT: 45.8 % (ref 39.0–52.0)
Hemoglobin: 15.9 g/dL (ref 13.0–17.0)
Lymphocytes Relative: 36 %
Lymphs Abs: 2.4 10*3/uL (ref 0.7–4.0)
MCH: 31.6 pg (ref 26.0–34.0)
MCHC: 34.7 g/dL (ref 30.0–36.0)
MCV: 91.1 fL (ref 78.0–100.0)
Monocytes Absolute: 0.3 10*3/uL (ref 0.1–1.0)
Monocytes Relative: 4 %
Neutro Abs: 3.8 10*3/uL (ref 1.7–7.7)
Neutrophils Relative %: 57 %
Platelets: 195 10*3/uL (ref 150–400)
RBC: 5.03 MIL/uL (ref 4.22–5.81)
RDW: 12.9 % (ref 11.5–15.5)
WBC: 6.7 10*3/uL (ref 4.0–10.5)

## 2017-08-05 LAB — BASIC METABOLIC PANEL
Anion gap: 12 (ref 5–15)
BUN: 8 mg/dL (ref 6–20)
CO2: 25 mmol/L (ref 22–32)
Calcium: 9.4 mg/dL (ref 8.9–10.3)
Chloride: 98 mmol/L — ABNORMAL LOW (ref 101–111)
Creatinine, Ser: 0.77 mg/dL (ref 0.61–1.24)
GFR calc Af Amer: >60 mL/min (ref >60)
GFR calc non Af Amer: >60 mL/min (ref >60)
Glucose, Bld: 350 mg/dL — ABNORMAL HIGH (ref 65–99)
Potassium: 4.1 mmol/L (ref 3.5–5.1)
Sodium: 135 mmol/L (ref 135–145)

## 2017-08-05 LAB — CBG MONITORING, ED: Glucose-Capillary: 273 mg/dL — ABNORMAL HIGH (ref 65–99)

## 2017-08-05 NOTE — ED Provider Notes (Signed)
Patient placed in Quick Look pathway, seen and evaluated   Chief Complaint: foot infection  HPI:   47 year old diabetic presents today with complaints of right foot infection.  Patient notes 5 days ago he developed swelling and redness in his right great toe, he notes this has progressed to the dorsum of the foot.  Patient notes he was seen by his primary care and started on Bactrim at that time, he notes he has been taking medication with no improvement in symptoms.  Patient denies any fever.  Patient notes generally his blood sugar is not well controlled.  ROS: Foot pain (one)  Physical Exam:  Vitals:   08/05/17 1502  BP: 112/85  Pulse: 94  Resp: 18  Temp: 98.7 F (37.1 C)  SpO2: 98%    Gen: No distress  Neuro: Awake and Alert  Skin: Warm Purulent drainage noted in the interdigital space         Initiation of care has begun. The patient has been counseled on the process, plan, and necessity for staying for the completion/evaluation, and the remainder of the medical screening examination    Rosalio LoudHedges, Jeremian Whitby, PA-C 08/05/17 1509    Eudelia Bunchardama, Amadeo GarnetPedro Eduardo, MD 08/05/17 2125

## 2017-08-05 NOTE — ED Triage Notes (Signed)
Pt is here with right foot infection that started from big toe and now with pus and bleeding between big and second toe.  Redness spreading up foot.  Pt is diabetic.  Has been treated with Bactrim and antiinflammatory.

## 2017-08-06 MED ORDER — AMOXICILLIN-POT CLAVULANATE 875-125 MG PO TABS: 1.0000 | ORAL_TABLET | Freq: Two times a day (BID) | ORAL | 0 refills | Status: DC

## 2017-08-06 MED ORDER — AMOXICILLIN-POT CLAVULANATE 875-125 MG PO TABS
1.0000 | ORAL_TABLET | Freq: Once | ORAL | Status: AC
  Administered 2017-08-06: 1 via ORAL
  Filled 2017-08-06: qty 1

## 2017-08-06 MED ORDER — AMPICILLIN-SULBACTAM SODIUM 3 (2-1) G IJ SOLR
3.0000 g | Freq: Once | INTRAMUSCULAR | Status: AC
  Administered 2017-08-06: 3 g via INTRAVENOUS
  Filled 2017-08-06: qty 3

## 2017-08-06 MED ORDER — SODIUM CHLORIDE 0.9 % IV BOLUS (SEPSIS)
1000.0000 mL | Freq: Once | INTRAVENOUS | Status: AC
  Administered 2017-08-06: 1000 mL via INTRAVENOUS

## 2017-08-06 NOTE — Discharge Instructions (Signed)
Continue taking Bactrim as prescribed.  Take Augmentin with this medication.  You would benefit from the use of an over-the-counter probiotic to prevent diarrhea as diarrhea is often a side effect of Augmentin.  Follow-up with your primary care doctor to ensure resolution of symptoms.  If symptoms continue to worsen, especially after 2 days of use of both Augmentin and Bactrim, return to the emergency department for additional evaluation.

## 2017-08-06 NOTE — ED Provider Notes (Signed)
[facility] EMERGENCY DEPARTMENT Provider Note   CSN: [id number] Arrival date & time: 08/05/17  1418     History   Chief Complaint Chief Complaint  Patient presents with  . Foot Pain    HPI Logan Weiss is a 47 y.o. male.  47 year old male with a history of diabetes presents to the emergency department for worsening redness and swelling to his right foot.  Patient states that he saw his primary care doctor for a wound on his foot 1 week ago.  He was started on Bactrim at this time.  He notes redness to be mild and present only to his toe at this appointment.  Over the subsequent 48 hours, patient had increasing redness which spread to the dorsum of his foot.  He states that redness to the top of his foot has been stable over the past 2 days.  He has been compliant with his Bactrim and denies any associated fevers.  No increasing drainage or numbness.  No hx of trauma or injury.      Past Medical History:  Diagnosis Date  . Diabetes [facility])    new dx    Patient Active Problem List   Diagnosis Date Noted  . 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 History of MRSA infection 06/08/2014    Past Surgical History:  Procedure Laterality Date  . APPENDECTOMY    . right knee surgery         Home Medications    Prior to Admission medications   Medication Sig Start Date End Date Taking? Authorizing Provider  glipiZIDE (GLUCOTROL XL) 10 MG 24 hr tablet Take 10 mg by mouth daily with breakfast.   Yes [provider]  naproxen (NAPROSYN) 375 MG tablet Take 1 tablet (375 mg total) by mouth 2 (two) times daily. 07/29/17  Yes Upstill, Nehemiah Settle, PA-C  sulfamethoxazole-trimethoprim (BACTRIM DS,SEPTRA DS) 800-160 MG tablet Take 1 tablet by mouth 2 (two) times daily.   Yes [provider]  amoxicillin-clavulanate (AUGMENTIN) 875-125 MG tablet Take 1 tablet by mouth every 12 (twelve) hours. 08/06/17   Antonietta Breach, PA-C    butalbital-acetaminophen-caffeine (FIORICET, ESGIC) 50-325-40 MG tablet Take 1 tablet by mouth every 6 (six) hours as needed for headache. Patient not taking: Reported on 08/06/2017 11/20/16 11/20/17  Virgel Manifold, MD  cephALEXin (KEFLEX) 500 MG capsule Take 1 capsule (500 mg total) by mouth 4 (four) times daily. Patient not taking: Reported on 08/06/2017 01/17/17   Clayton Bibles, PA-C  cyclobenzaprine (FLEXERIL) 10 MG tablet Take 1 tablet (10 mg total) by mouth 2 (two) times daily as needed for muscle spasms. Patient not taking: Reported on 08/06/2017 03/21/17   Recardo Evangelist, PA-C  gabapentin (NEURONTIN) 300 MG capsule Take 1 capsule (300 mg total) by mouth 3 (three) times daily. Patient not taking: Reported on 08/06/2017 03/21/17   Recardo Evangelist, PA-C  glipiZIDE (GLUCOTROL) 5 MG tablet Take 1 tablet (5 mg total) by mouth daily before breakfast. Patient not taking: Reported on 08/06/2017 10/10/16   Monico Blitz, MD  GlucoCom Lancets MISC Check blood sugar TID & QHS 06/22/14   Lorayne Marek, MD  glucose blood (CHOICE DM FORA G20 TEST STRIPS) test strip Use as instructed 06/22/14   Advani, Vernon Prey, MD  glucose monitoring kit (FREESTYLE) monitoring kit 1 each by Does not apply route as needed for other. 06/22/14   Lorayne Marek, MD  ibuprofen (ADVIL,MOTRIN) 600 MG tablet Take 1 tablet (600 mg  total) by mouth every 6 (six) hours as needed. Patient not taking: Reported on 08/06/2017 03/21/17   Recardo Evangelist, PA-C  magic mouthwash SOLN Take 5 mLs daily as needed by mouth for mouth pain. Patient not taking: Reported on 08/06/2017 05/17/17   Langston Masker B, PA-C  metFORMIN (GLUCOPHAGE) 500 MG tablet Take 1 tablet (500 mg total) by mouth 2 (two) times daily with a meal. Patient not taking: Reported on 08/06/2017 06/05/14   Le, Thao P, DO  potassium chloride SA (K-DUR,KLOR-CON) 20 MEQ tablet Take 1 tablet (20 mEq total) by mouth 2 (two) times daily for 3 days. Patient not taking:  Reported on 08/06/2017 07/29/17 08/06/22  Charlann Lange, PA-C    Family History Family History  Problem Relation Age of Onset  . Hyperlipidemia Mother   . Mental retardation Mother   . Diabetes Mother   . Diabetes Father   . Hyperlipidemia Father   . Hypertension Father   . Diabetes Maternal Grandmother   . Diabetes Maternal Grandfather   . Diabetes Paternal Grandmother   . Diabetes Paternal Grandfather     Social History Social History   Tobacco Use  . Smoking status: Former Smoker    Packs/day: 0.00    Years: 5.00    Pack years: 0.00  . Smokeless tobacco: Never Used  Substance Use Topics  . Alcohol use: No    Alcohol/week: 0.0 oz    Comment: socially  . Drug use: No     Allergies   Doxycycline   Review of Systems Review of Systems Ten systems reviewed and are negative for acute change, except as noted in the HPI.    Physical Exam Updated Vital Signs BP 132/84   Pulse 86   Temp 98.9 F (37.2 C) (Oral)   Resp 18   SpO2 99%   Physical Exam  Constitutional: He is oriented to person, place, and time. He appears well-developed and well-nourished. No distress.  Nontoxic appearing and in NAD  HENT:  Head: Normocephalic and atraumatic.  Eyes: Conjunctivae and EOM are normal. No scleral icterus.  Neck: Normal range of motion.  Cardiovascular: Normal rate, regular rhythm and intact distal pulses.  DP pulse 2+ in the RLE  Pulmonary/Chest: Effort normal. No respiratory distress.  Respirations even and unlabored  Musculoskeletal: Normal range of motion.  Skin wound to the interphalangeal space of the R great and second toes. Scant drainage on gauze. There is erythema and warm extending proximally to the dorsum of the foot. Mild edema. No significant induration. No red linear streaking.  Neurological: He is alert and oriented to person, place, and time. He exhibits normal muscle tone. Coordination normal.  Sensation to light touch intact in the RLE. Patient able to  wiggle all toes.  Skin: Skin is warm and dry. No rash noted. He is not diaphoretic. No erythema. No pallor.  Psychiatric: He has a normal mood and affect. His behavior is normal.  Nursing note and vitals reviewed.        ED Treatments / Results  Labs (all labs ordered are listed, but only abnormal results are displayed) Labs Reviewed  BASIC METABOLIC PANEL - Abnormal; Notable for the following components:      Result Value   Chloride 98 (*)    Glucose, Bld 350 (*)    All other components within normal limits  CBG MONITORING, ED - Abnormal; Notable for the following components:   Glucose-Capillary 273 (*)    All other components within normal limits  CULTURE, BLOOD (ROUTINE X 2)  CULTURE, BLOOD (ROUTINE X 2)  CBC WITH DIFFERENTIAL/PLATELET    EKG  EKG Interpretation None       Radiology Dg Foot Complete Right  Result Date: 08/05/2017 CLINICAL DATA:  Great toe infection EXAM: RIGHT FOOT COMPLETE - 3+ VIEW COMPARISON:  None. FINDINGS: No acute bony abnormality. Specifically, no fracture, subluxation, or dislocation. No radiographic changes of osteomyelitis. Joint spaces are maintained. IMPRESSION: No acute bony abnormality. Electronically Signed   By: Rolm Baptise M.D.   On: 08/05/2017 16:22    Procedures Procedures (including critical care time)  Medications Ordered in ED Medications  Ampicillin-Sulbactam (UNASYN) 3 g in sodium chloride 0.9 % 100 mL IVPB (0 g Intravenous Stopped 08/06/17 0128)  sodium chloride 0.9 % bolus 1,000 mL (0 mLs Intravenous Stopped 08/06/17 0128)  amoxicillin-clavulanate (AUGMENTIN) 875-125 MG per tablet 1 tablet (1 tablet Oral Given 08/06/17 0131)     Initial Impression / Assessment and Plan / ED Course  I have reviewed the triage vital signs and the nursing notes.  Pertinent labs & imaging results that were available during my care of the patient were reviewed by me and considered in my medical decision making (see chart for details).       47 year old male presents to the emergency department with evidence of cellulitis to his right foot.  Patient is neurovascularly intact on exam.  He has had a laboratory workup today which is reassuring.  X-ray without evidence of osteomyelitis.  Vitals are not suggestive of underlying SIRS or sepsis at the present time.  Patient had worsening of this redness within the first 2 days of starting Bactrim.  Over the last 48 hours the redness has remained stable, per patient.  Plan to broaden antibiotic coverage by adding Augmentin.  The patient has been advised to continue Bactrim as prescribed until finished.  He has been given strict instruction to follow-up with his primary care doctor for a recheck of his wound.  I do not believe the patient adequately qualifies for failed outpatient management and need for admission.  He has been instructed to return to the emergency department for new or concerning symptoms.  Patient discharged in stable condition with no unaddressed concerns.   Final Clinical Impressions(s) / ED Diagnoses   Final diagnoses:  Cellulitis of toe of right foot    ED Discharge Orders        Ordered    amoxicillin-clavulanate (AUGMENTIN) 875-125 MG tablet  Every 12 hours     08/06/17 0130       Antonietta Breach, PA-C 08/06/17 0407    Orpah Greek, MD 08/06/17 [phone]

## 2017-08-10 LAB — CULTURE, BLOOD (ROUTINE X 2)
Culture: NO GROWTH
Culture: NO GROWTH
Special Requests: ADEQUATE
Special Requests: ADEQUATE

## 2017-08-14 ENCOUNTER — Other Ambulatory Visit: Payer: Self-pay | Admitting: Podiatry

## 2017-08-14 ENCOUNTER — Encounter: Payer: Self-pay | Admitting: Podiatry

## 2017-08-14 ENCOUNTER — Ambulatory Visit (INDEPENDENT_AMBULATORY_CARE_PROVIDER_SITE_OTHER): Payer: BLUE CROSS/BLUE SHIELD

## 2017-08-14 ENCOUNTER — Ambulatory Visit: Payer: BLUE CROSS/BLUE SHIELD | Admitting: Podiatry

## 2017-08-14 DIAGNOSIS — M79676 Pain in unspecified toe(s): Secondary | ICD-10-CM | POA: Diagnosis not present

## 2017-08-14 DIAGNOSIS — B351 Tinea unguium: Secondary | ICD-10-CM | POA: Diagnosis not present

## 2017-08-14 DIAGNOSIS — E1140 Type 2 diabetes mellitus with diabetic neuropathy, unspecified: Secondary | ICD-10-CM | POA: Diagnosis not present

## 2017-08-14 DIAGNOSIS — Q828 Other specified congenital malformations of skin: Secondary | ICD-10-CM

## 2017-08-14 DIAGNOSIS — E1149 Type 2 diabetes mellitus with other diabetic neurological complication: Secondary | ICD-10-CM | POA: Diagnosis not present

## 2017-08-14 DIAGNOSIS — M79674 Pain in right toe(s): Secondary | ICD-10-CM

## 2017-08-14 DIAGNOSIS — M79609 Pain in unspecified limb: Secondary | ICD-10-CM

## 2017-08-14 MED ORDER — TERBINAFINE HCL 250 MG PO TABS: ORAL_TABLET | ORAL | 0 refills | Status: DC

## 2017-08-14 NOTE — Progress Notes (Signed)
Subjective:   Patient ID: Logan Weiss L Axon, male   DOB: 47 y.o.   MRN: [id number]   HPI Patient states that he went to the emergency room and was treated for cellulitis right but is not sure if that is what he has and states that he is now on an antibiotic and has severe lesions and nailbeds that he cannot take care of and has an A1c of 13.5.  Patient does not smoke currently and would like to be active   Review of Systems  All other systems reviewed and are negative.       Objective:  Physical Exam  Constitutional: He appears well-developed and well-nourished.  Cardiovascular: Intact distal pulses.  Pulmonary/Chest: Effort normal.  Musculoskeletal: Normal range of motion.  Neurological: He is alert.  Skin: Skin is warm.  Nursing note and vitals reviewed.   Vascular status was intact with diminished sharp dull vibratory bilateral.  Patient is noted to have severe thickness of lesions on both feet hallux and has nail disease 1-5 both feet that are very incurvated and sore with no current redness of the top of the right foot or indications of cellulitic event with patient still on antibiotics     Assessment:  High risk diabetic who is in poor control with probable history cellulitis with lesion formation and severe nail disease     Plan:  H&P and diabetic education rendered.  His skin is affected by fungus along with nails and I am placing him on a pulse pack and I explained risk of this and today I debrided nailbeds 1-5 both feet and lesions on both feet with no iatrogenic bleeding noted  X-ray right foot did not indicate signs of any osteolytic or soft tissue swelling upon x-ray evaluation

## 2017-08-14 NOTE — Progress Notes (Signed)
   Subjective:    Patient ID: Logan Weiss, male    DOB: 07/15/1970, 47 y.o.   MRN: [id number]  HPI    Review of Systems  All other systems reviewed and are negative.      Objective:   Physical Exam        Assessment & Plan:

## 2017-08-15 ENCOUNTER — Ambulatory Visit: Payer: BLUE CROSS/BLUE SHIELD | Admitting: Podiatry

## 2017-08-20 ENCOUNTER — Telehealth: Payer: Self-pay | Admitting: Podiatry

## 2017-08-20 NOTE — Telephone Encounter (Signed)
I informed pt is did not see Dr. Charlsie Merlesegal had ordered an antibiotic in his 08/14/2017 clinicals, I would ask Dr. Charlsie Merlesegal for the prescription and call again.

## 2017-08-20 NOTE — Telephone Encounter (Signed)
I was seen there last Wednesday 06 February. My PCP had referred me for foot and toe swelling. Both my wife and I asked and was told by the nurse that all the medications would be sent to my pharmacy that we provided. The doctor prescribed me two medications, one was antifungus and the other one was an extension of the current antibiotics I was already taking. When I went to my pharmacy they said they only sent one prescription they did not send the extended antibiotics. I called the nurse station on Friday and never received any correspondence yet. I spoke to someone who transferred me to the nurse. Could you please call me back and let me know if you are or are not going to send in that extended antibiotic.

## 2017-08-21 MED ORDER — SULFAMETHOXAZOLE-TRIMETHOPRIM 800-160 MG PO TABS: 1.0000 | ORAL_TABLET | Freq: Two times a day (BID) | ORAL | 0 refills | Status: DC

## 2017-08-21 NOTE — Telephone Encounter (Signed)
Dr. Charlsie Merlesegal states continue the antifungal and ordered Septra DS #20 one tablet bid until gone. [facility] to [id number]

## 2017-08-21 NOTE — Addendum Note (Signed)
Addended by: Alphia Kava'CONNELL, Nanette Wirsing D on: 08/21/2017 05:11 PM   Modules accepted: Orders

## 2017-11-04 ENCOUNTER — Encounter (HOSPITAL_COMMUNITY): Payer: Self-pay | Admitting: *Deleted

## 2017-11-04 ENCOUNTER — Inpatient Hospital Stay (HOSPITAL_COMMUNITY)
Admission: EM | Admit: 2017-11-04 | Discharge: 2017-11-08 | DRG: 603 | Disposition: A | Discharge status: Home / Self Care | Payer: BLUE CROSS/BLUE SHIELD | Attending: Internal Medicine | Admitting: Internal Medicine

## 2017-11-04 ENCOUNTER — Emergency Department (HOSPITAL_COMMUNITY): Payer: BLUE CROSS/BLUE SHIELD

## 2017-11-04 ENCOUNTER — Other Ambulatory Visit: Payer: Self-pay

## 2017-11-04 DIAGNOSIS — Z22322 Carrier or suspected carrier of Methicillin resistant Staphylococcus aureus: Secondary | ICD-10-CM

## 2017-11-04 DIAGNOSIS — L0390 Cellulitis, unspecified: Secondary | ICD-10-CM | POA: Diagnosis present

## 2017-11-04 DIAGNOSIS — E872 Acidosis: Secondary | ICD-10-CM | POA: Diagnosis present

## 2017-11-04 DIAGNOSIS — R7989 Other specified abnormal findings of blood chemistry: Secondary | ICD-10-CM | POA: Diagnosis present

## 2017-11-04 DIAGNOSIS — M545 Low back pain: Secondary | ICD-10-CM | POA: Diagnosis present

## 2017-11-04 DIAGNOSIS — E1165 Type 2 diabetes mellitus with hyperglycemia: Secondary | ICD-10-CM | POA: Diagnosis present

## 2017-11-04 DIAGNOSIS — G8929 Other chronic pain: Secondary | ICD-10-CM | POA: Diagnosis present

## 2017-11-04 DIAGNOSIS — L03211 Cellulitis of face: Secondary | ICD-10-CM | POA: Diagnosis not present

## 2017-11-04 DIAGNOSIS — Z7984 Long term (current) use of oral hypoglycemic drugs: Secondary | ICD-10-CM

## 2017-11-04 DIAGNOSIS — Z87891 Personal history of nicotine dependence: Secondary | ICD-10-CM

## 2017-11-04 DIAGNOSIS — Z6830 Body mass index (BMI) 30.0-30.9, adult: Secondary | ICD-10-CM

## 2017-11-04 DIAGNOSIS — Z79899 Other long term (current) drug therapy: Secondary | ICD-10-CM

## 2017-11-04 DIAGNOSIS — R030 Elevated blood-pressure reading, without diagnosis of hypertension: Secondary | ICD-10-CM | POA: Diagnosis present

## 2017-11-04 DIAGNOSIS — E876 Hypokalemia: Secondary | ICD-10-CM | POA: Diagnosis present

## 2017-11-04 DIAGNOSIS — F17290 Nicotine dependence, other tobacco product, uncomplicated: Secondary | ICD-10-CM | POA: Diagnosis present

## 2017-11-04 DIAGNOSIS — E669 Obesity, unspecified: Secondary | ICD-10-CM | POA: Diagnosis present

## 2017-11-04 DIAGNOSIS — Z881 Allergy status to other antibiotic agents status: Secondary | ICD-10-CM

## 2017-11-04 HISTORY — DX: Cellulitis, unspecified: L03.90

## 2017-11-04 HISTORY — DX: Personal history of Methicillin resistant Staphylococcus aureus infection: Z86.14

## 2017-11-04 LAB — URINALYSIS, ROUTINE W REFLEX MICROSCOPIC
Bilirubin Urine: NEGATIVE
Glucose, UA: NEGATIVE mg/dL
HGB URINE DIPSTICK: NEGATIVE
Ketones, ur: NEGATIVE mg/dL
Leukocytes, UA: NEGATIVE
Nitrite: NEGATIVE
PH: 6 (ref 5.0–8.0)
Protein, ur: NEGATIVE mg/dL
SPECIFIC GRAVITY, URINE: 1.023 (ref 1.005–1.030)

## 2017-11-04 LAB — COMPREHENSIVE METABOLIC PANEL
ALT: 72 U/L — AB (ref 17–63)
ANION GAP: 10 (ref 5–15)
AST: 65 U/L — ABNORMAL HIGH (ref 15–41)
Albumin: 4.2 g/dL (ref 3.5–5.0)
Alkaline Phosphatase: 109 U/L (ref 38–126)
BUN: 6 mg/dL (ref 6–20)
CHLORIDE: 100 mmol/L — AB (ref 101–111)
CO2: 30 mmol/L (ref 22–32)
CREATININE: 0.83 mg/dL (ref 0.61–1.24)
Calcium: 9.8 mg/dL (ref 8.9–10.3)
GFR calc non Af Amer: >60 mL/min (ref >60)
Glucose, Bld: 113 mg/dL — ABNORMAL HIGH (ref 65–99)
Potassium: 3.8 mmol/L (ref 3.5–5.1)
SODIUM: 140 mmol/L (ref 135–145)
Total Bilirubin: 0.5 mg/dL (ref 0.3–1.2)
Total Protein: 8.4 g/dL — ABNORMAL HIGH (ref 6.5–8.1)

## 2017-11-04 LAB — CBC WITH DIFFERENTIAL/PLATELET
BASOS PCT: 1 %
Basophils Absolute: 0.1 10*3/uL (ref 0.0–0.1)
EOS PCT: 4 %
Eosinophils Absolute: 0.3 10*3/uL (ref 0.0–0.7)
HEMATOCRIT: 51.5 % (ref 39.0–52.0)
Hemoglobin: 17.9 g/dL — ABNORMAL HIGH (ref 13.0–17.0)
LYMPHS PCT: 32 %
Lymphs Abs: 2.1 10*3/uL (ref 0.7–4.0)
MCH: 32.7 pg (ref 26.0–34.0)
MCHC: 34.8 g/dL (ref 30.0–36.0)
MCV: 94 fL (ref 78.0–100.0)
Monocytes Absolute: 0.3 10*3/uL (ref 0.1–1.0)
Monocytes Relative: 4 %
NEUTROS PCT: 59 %
Neutro Abs: 3.8 10*3/uL (ref 1.7–7.7)
Platelets: 138 10*3/uL — ABNORMAL LOW (ref 150–400)
RBC: 5.48 MIL/uL (ref 4.22–5.81)
RDW: 13.4 % (ref 11.5–15.5)
WBC: 6.6 10*3/uL (ref 4.0–10.5)

## 2017-11-04 LAB — I-STAT CG4 LACTIC ACID, ED
LACTIC ACID, VENOUS: 2.29 mmol/L — AB (ref 0.5–1.9)
Lactic Acid, Venous: 3.62 mmol/L (ref 0.5–1.9)

## 2017-11-04 MED ORDER — SODIUM CHLORIDE 0.9 % IV BOLUS
1000.0000 mL | Freq: Once | INTRAVENOUS | Status: AC
  Administered 2017-11-04: 1000 mL via INTRAVENOUS

## 2017-11-04 MED ORDER — CLINDAMYCIN PHOSPHATE 900 MG/50ML IV SOLN
900.0000 mg | Freq: Once | INTRAVENOUS | Status: AC
  Administered 2017-11-04: 900 mg via INTRAVENOUS
  Filled 2017-11-04: qty 50

## 2017-11-04 MED ORDER — IBUPROFEN 400 MG PO TABS
600.0000 mg | ORAL_TABLET | Freq: Once | ORAL | Status: AC
  Administered 2017-11-04: 600 mg via ORAL
  Filled 2017-11-04: qty 1

## 2017-11-04 NOTE — ED Triage Notes (Addendum)
Pt has been having back pain for the past month, generalized body aches, and congestion x 2 weeks. Also reports increased swelling to face that started two days ago; hx of same and diagnosed with cellulitis

## 2017-11-04 NOTE — ED Provider Notes (Signed)
Patient placed in Quick Look pathway, seen and evaluated   Chief Complaint: Fever, body aches  HPI:   She reports that for the past few weeks he has had generalized pain in his back along with sinus pain/facial swelling.  He reports cough associated with this.  He reports that his sugars have been well controlled and that he checked it prior to coming and it was 215.  He reports compliance with all of his medications.    ROS: No shortness of breath (one)  Physical Exam:   Gen: No distress  Neuro: Awake and Alert  Skin: Warm    Focused Exam: Patient has slight swelling on either side of his nose.  He is in no obvious distress.  No increased respiratory work of breathing.   Initiation of care has begun. The patient has been counseled on the process, plan, and necessity for staying for the completion/evaluation, and the remainder of the medical screening examination    Norman Clay 11/04/17 Orvil Feil, MD 11/05/17 [phone]

## 2017-11-04 NOTE — ED Provider Notes (Signed)
[facility] EMERGENCY DEPARTMENT Provider Note   CSN: [id number] Arrival date & time: 11/04/17  1823     History   Chief Complaint Chief Complaint  Patient presents with  . Facial Swelling  . Fever    HPI Logan Weiss is a 47 y.o. male.  Patient with PMH of DM presents to the ED with a chief complaint of facial swelling.  He reports a history of facial cellulitis.  He states that the swelling came up rapidly today.  Reports that the swelling typically starts around his ear, but today started at his nose and spread across his cheeks.  He reports mild discomfort with eye movement and complains of slight pressure behind his eyes.  He denies any dental pain.  He reports having chills at home.  Additionally, patient complains of chronic low back pain.  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.  He denies any radiating pain.  He states that the pain has come and gone for months.  He denies any bowel or bladder incontinence.  He denies any IV drug use.  Denies any hx of cancer.  The history is provided by the patient. No language interpreter was used.    Past Medical History:  Diagnosis Date  . Diabetes [facility])    new dx    Patient Active Problem List   Diagnosis Date Noted  . 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 History of MRSA infection 06/08/2014    Past Surgical History:  Procedure Laterality Date  . APPENDECTOMY    . right knee surgery          Home Medications    Prior to Admission medications   Medication Sig Start Date End Date Taking? Authorizing Provider  amoxicillin-clavulanate (AUGMENTIN) 875-125 MG tablet Take 1 tablet by mouth every 12 (twelve) hours. 08/06/17   Antonietta Breach, PA-C  butalbital-acetaminophen-caffeine (FIORICET, ESGIC) [phone] MG tablet Take 1 tablet by mouth every 6 (six) hours as needed for headache. 11/20/16 11/20/17  Virgel Manifold, MD  cephALEXin (KEFLEX) 500 MG capsule Take 1 capsule (500  mg total) by mouth 4 (four) times daily. 01/17/17   Clayton Bibles, PA-C  cyclobenzaprine (FLEXERIL) 10 MG tablet Take 1 tablet (10 mg total) by mouth 2 (two) times daily as needed for muscle spasms. 03/21/17   Recardo Evangelist, PA-C  gabapentin (NEURONTIN) 300 MG capsule Take 1 capsule (300 mg total) by mouth 3 (three) times daily. 03/21/17   Recardo Evangelist, PA-C  glipiZIDE (GLUCOTROL XL) 10 MG 24 hr tablet Take 10 mg by mouth daily with breakfast.    [provider]  glipiZIDE (GLUCOTROL) 5 MG tablet Take 1 tablet (5 mg total) by mouth daily before breakfast. 10/10/16   Caudill, Gwynneth Aliment, MD  GlucoCom Lancets MISC Check blood sugar TID & QHS 06/22/14   Advani, Vernon Prey, MD  glucose blood (CHOICE DM FORA G20 TEST STRIPS) test strip Use as instructed 06/22/14   Advani, Vernon Prey, MD  glucose monitoring kit (FREESTYLE) monitoring kit 1 each by Does not apply route as needed for other. 06/22/14   Lorayne Marek, MD  ibuprofen (ADVIL,MOTRIN) 600 MG tablet Take 1 tablet (600 mg total) by mouth every 6 (six) hours as needed. 03/21/17   Recardo Evangelist, PA-C  magic mouthwash SOLN Take 5 mLs daily as needed by mouth for mouth pain. 05/17/17   Langston Masker B, PA-C  metFORMIN (GLUCOPHAGE) 500 MG tablet Take 1 tablet (500 mg total) by mouth  2 (two) times daily with a meal. 06/05/14   Le, Thao P, DO  naproxen (NAPROSYN) 375 MG tablet Take 1 tablet (375 mg total) by mouth 2 (two) times daily. 07/29/17   Charlann Lange, PA-C  potassium chloride SA (K-DUR,KLOR-CON) 20 MEQ tablet Take 1 tablet (20 mEq total) by mouth 2 (two) times daily for 3 days. 07/29/17 08/06/22  Charlann Lange, PA-C  sulfamethoxazole-trimethoprim (BACTRIM DS,SEPTRA DS) 800-160 MG tablet Take 1 tablet by mouth 2 (two) times daily.    [provider]  sulfamethoxazole-trimethoprim (BACTRIM DS,SEPTRA DS) 800-160 MG tablet Take 1 tablet by mouth 2 (two) times daily. 08/21/17   Wallene Huh, DPM  terbinafine (LAMISIL) 250 MG  tablet Please take one a day x 7days, repeat every 4 weeks x 4 months 08/14/17   Wallene Huh, DPM    Family History Family History  Problem Relation Age of Onset  . Hyperlipidemia Mother   . Mental retardation Mother   . Diabetes Mother   . Diabetes Father   . Hyperlipidemia Father   . Hypertension Father   . Diabetes Maternal Grandmother   . Diabetes Maternal Grandfather   . Diabetes Paternal Grandmother   . Diabetes Paternal Grandfather     Social History Social History   Tobacco Use  . Smoking status: Former Smoker    Packs/day: 0.00    Years: 5.00    Pack years: 0.00  . Smokeless tobacco: Never Used  Substance Use Topics  . Alcohol use: No    Alcohol/week: 0.0 oz    Comment: socially  . Drug use: No     Allergies   Doxycycline   Review of Systems Review of Systems  All other systems reviewed and are negative.    Physical Exam Updated Vital Signs BP 132/80 (BP Location: Right Arm)   Pulse (!) 109   Temp 99 F (37.2 C) (Oral)   Resp 20   Ht '5\' 6"'$  (1.676 m)   Wt 86.2 kg (190 lb)   SpO2 95%   BMI 30.67 kg/m   Physical Exam  Constitutional: He is oriented to person, place, and time. He appears well-developed and well-nourished.  HENT:  Head: Normocephalic and atraumatic.  Mild swelling of nose and infraorbital region bilaterally, no sign of abscess  Eyes: Pupils are equal, round, and reactive to light. Conjunctivae and EOM are normal. Right eye exhibits no discharge. Left eye exhibits no discharge. No scleral icterus.  EOMs intact  Neck: Normal range of motion. Neck supple. No JVD present.  Cardiovascular: Normal rate, regular rhythm and normal heart sounds. Exam reveals no gallop and no friction rub.  No murmur heard. Pulmonary/Chest: Effort normal and breath sounds normal. No respiratory distress. He has no wheezes. He has no rales. He exhibits no tenderness.  Abdominal: Soft. He exhibits no distension and no mass. There is no tenderness. There  is no rebound and no guarding.  Musculoskeletal: Normal range of motion. He exhibits no edema or tenderness.  No CTLS spine tenderness ROM and strength of extremities is 5/5  Neurological: He is alert and oriented to person, place, and time.  Sensation intact  Skin: Skin is warm and dry.  Psychiatric: He has a normal mood and affect. His behavior is normal. Judgment and thought content normal.  Nursing note and vitals reviewed.    ED Treatments / Results  Labs (all labs ordered are listed, but only abnormal results are displayed) Labs Reviewed  COMPREHENSIVE METABOLIC PANEL - Abnormal; Notable for the  following components:      Result Value   Chloride 100 (*)    Glucose, Bld 113 (*)    Total Protein 8.4 (*)    AST 65 (*)    ALT 72 (*)    All other components within normal limits  CBC WITH DIFFERENTIAL/PLATELET - Abnormal; Notable for the following components:   Hemoglobin 17.9 (*)    Platelets 138 (*)    All other components within normal limits  I-STAT CG4 LACTIC ACID, ED - Abnormal; Notable for the following components:   Lactic Acid, Venous 2.29 (*)    All other components within normal limits  URINALYSIS, ROUTINE W REFLEX MICROSCOPIC  INFLUENZA PANEL BY PCR (TYPE A & B)  I-STAT CG4 LACTIC ACID, ED    EKG None  Radiology Dg Chest 2 View  Result Date: 11/04/2017 CLINICAL DATA:  Flu like symptoms EXAM: CHEST - 2 VIEW COMPARISON:  07/28/2017 FINDINGS: The heart size and mediastinal contours are within normal limits. Both lungs are clear. Degenerative changes of the spine. Mild chronic compression lower thoracic vertebra. IMPRESSION: No active cardiopulmonary disease. Electronically Signed   By: Donavan Foil M.D.   On: 11/04/2017 20:10    Procedures Procedures (including critical care time)  Medications Ordered in ED Medications  clindamycin (CLEOCIN) IVPB 900 mg (has no administration in time range)  ibuprofen (ADVIL,MOTRIN) tablet 600 mg (600 mg Oral Given 11/04/17  1937)     Initial Impression / Assessment and Plan / ED Course  I have reviewed the triage vital signs and the nursing notes.  Pertinent labs & imaging results that were available during my care of the patient were reviewed by me and considered in my medical decision making (see chart for details).     Patient with facial swelling and fever.  Concern for preseptal cellulitis.  Also has some eye pain and mild pain with eye movement.  Will check CT to rule out periorbital cellulitis.  No dental abscess.  Will need follow-up for chronic back pain.  Lactate is elevated, but doubt sepsis.  Patient is heme concentrated, HGB is 17.9.  I suspect that the patient is dry.  CT is consistent with maybe mild pre-septal cellulitis.  Given a run of IV clinda here.  Patient is very well appearing.   5:16 AM Patient has had worsening swelling on reassessment, mainly around left eye.  Feel that patient will need to be admitted given his worsening symptoms.  Appreciate Dr. Hal Hope for admitting the patient.  Final Clinical Impressions(s) / ED Diagnoses   Final diagnoses:  Facial cellulitis    ED Discharge Orders    None       Montine Circle, PA-C 11/05/17 [id number]    Virgel Manifold, MD 11/05/17 1504

## 2017-11-05 ENCOUNTER — Emergency Department (HOSPITAL_COMMUNITY): Payer: BLUE CROSS/BLUE SHIELD

## 2017-11-05 ENCOUNTER — Encounter (HOSPITAL_COMMUNITY): Payer: Self-pay | Admitting: Radiology

## 2017-11-05 DIAGNOSIS — L03211 Cellulitis of face: Secondary | ICD-10-CM | POA: Diagnosis present

## 2017-11-05 DIAGNOSIS — L0390 Cellulitis, unspecified: Secondary | ICD-10-CM | POA: Diagnosis present

## 2017-11-05 DIAGNOSIS — E1165 Type 2 diabetes mellitus with hyperglycemia: Secondary | ICD-10-CM | POA: Diagnosis not present

## 2017-11-05 DIAGNOSIS — R030 Elevated blood-pressure reading, without diagnosis of hypertension: Secondary | ICD-10-CM | POA: Diagnosis present

## 2017-11-05 LAB — COMPREHENSIVE METABOLIC PANEL
ALT: 57 U/L (ref 17–63)
AST: 49 U/L — AB (ref 15–41)
Albumin: 3 g/dL — ABNORMAL LOW (ref 3.5–5.0)
Alkaline Phosphatase: 95 U/L (ref 38–126)
Anion gap: 10 (ref 5–15)
BILIRUBIN TOTAL: 0.4 mg/dL (ref 0.3–1.2)
BUN: 5 mg/dL — AB (ref 6–20)
CO2: 25 mmol/L (ref 22–32)
CREATININE: 0.88 mg/dL (ref 0.61–1.24)
Calcium: 8.1 mg/dL — ABNORMAL LOW (ref 8.9–10.3)
Chloride: 101 mmol/L (ref 101–111)
Glucose, Bld: 183 mg/dL — ABNORMAL HIGH (ref 65–99)
POTASSIUM: 3.6 mmol/L (ref 3.5–5.1)
Sodium: 136 mmol/L (ref 135–145)
TOTAL PROTEIN: 6.1 g/dL — AB (ref 6.5–8.1)

## 2017-11-05 LAB — I-STAT CG4 LACTIC ACID, ED
Lactic Acid, Venous: 2.41 mmol/L (ref 0.5–1.9)
Lactic Acid, Venous: 1.84 mmol/L (ref 0.5–1.9)

## 2017-11-05 LAB — GLUCOSE, CAPILLARY
GLUCOSE-CAPILLARY: 209 mg/dL — AB (ref 65–99)
GLUCOSE-CAPILLARY: 179 mg/dL — AB (ref 65–99)
GLUCOSE-CAPILLARY: 239 mg/dL — AB (ref 65–99)

## 2017-11-05 LAB — CBC WITH DIFFERENTIAL/PLATELET
BASOS ABS: 0 10*3/uL (ref 0.0–0.1)
Basophils Relative: 0 %
EOS ABS: 0.2 10*3/uL (ref 0.0–0.7)
EOS PCT: 2 %
HCT: 40.8 % (ref 39.0–52.0)
Hemoglobin: 13.7 g/dL (ref 13.0–17.0)
Lymphocytes Relative: 35 %
Lymphs Abs: 2.4 10*3/uL (ref 0.7–4.0)
MCH: 31.4 pg (ref 26.0–34.0)
MCHC: 33.6 g/dL (ref 30.0–36.0)
MCV: 93.6 fL (ref 78.0–100.0)
Monocytes Absolute: 0.5 10*3/uL (ref 0.1–1.0)
Monocytes Relative: 8 %
NEUTROS PCT: 55 %
Neutro Abs: 3.8 10*3/uL (ref 1.7–7.7)
PLATELETS: 114 10*3/uL — AB (ref 150–400)
RBC: 4.36 MIL/uL (ref 4.22–5.81)
RDW: 13.5 % (ref 11.5–15.5)
WBC: 6.8 10*3/uL (ref 4.0–10.5)

## 2017-11-05 LAB — INFLUENZA PANEL BY PCR (TYPE A & B)
INFLAPCR: NEGATIVE
Influenza B By PCR: NEGATIVE

## 2017-11-05 LAB — HIV ANTIBODY (ROUTINE TESTING W REFLEX): HIV Screen 4th Generation wRfx: NONREACTIVE

## 2017-11-05 LAB — CBG MONITORING, ED: GLUCOSE-CAPILLARY: 158 mg/dL — AB (ref 65–99)

## 2017-11-05 MED ORDER — SODIUM CHLORIDE 0.9 % IV BOLUS
1000.0000 mL | Freq: Once | INTRAVENOUS | Status: AC
  Administered 2017-11-05: 1000 mL via INTRAVENOUS

## 2017-11-05 MED ORDER — INSULIN GLARGINE 100 UNIT/ML ~~LOC~~ SOLN
5.0000 [IU] | Freq: Every day | SUBCUTANEOUS | Status: DC
  Administered 2017-11-05 – 2017-11-08 (×4): 5 [IU] via SUBCUTANEOUS
  Filled 2017-11-05 (×4): qty 0.05

## 2017-11-05 MED ORDER — CLINDAMYCIN HCL 150 MG PO CAPS: 300.0000 mg | ORAL_CAPSULE | Freq: Three times a day (TID) | ORAL | 0 refills | Status: DC

## 2017-11-05 MED ORDER — FENTANYL CITRATE (PF) 100 MCG/2ML IJ SOLN
75.0000 ug | Freq: Once | INTRAMUSCULAR | Status: AC
  Administered 2017-11-05: 75 ug via INTRAVENOUS
  Filled 2017-11-05: qty 2

## 2017-11-05 MED ORDER — MORPHINE SULFATE (PF) 4 MG/ML IV SOLN
4.0000 mg | Freq: Once | INTRAVENOUS | Status: AC
  Administered 2017-11-05: 4 mg via INTRAVENOUS
  Filled 2017-11-05: qty 1

## 2017-11-05 MED ORDER — VANCOMYCIN HCL 10 G IV SOLR
1500.0000 mg | Freq: Two times a day (BID) | INTRAVENOUS | Status: DC
  Administered 2017-11-05 – 2017-11-08 (×6): 1500 mg via INTRAVENOUS
  Filled 2017-11-05 (×8): qty 1500

## 2017-11-05 MED ORDER — ONDANSETRON HCL 4 MG/2ML IJ SOLN: 4.0000 mg | Freq: Four times a day (QID) | INTRAMUSCULAR | Status: DC | PRN

## 2017-11-05 MED ORDER — VANCOMYCIN HCL 10 G IV SOLR
1500.0000 mg | Freq: Once | INTRAVENOUS | Status: AC
  Administered 2017-11-05: 1500 mg via INTRAVENOUS
  Filled 2017-11-05: qty 1500

## 2017-11-05 MED ORDER — ACETAMINOPHEN 325 MG PO TABS
650.0000 mg | ORAL_TABLET | Freq: Four times a day (QID) | ORAL | Status: DC | PRN
  Administered 2017-11-05 (×2): 650 mg via ORAL
  Filled 2017-11-05 (×2): qty 2

## 2017-11-05 MED ORDER — SODIUM CHLORIDE 0.9 % IV SOLN
INTRAVENOUS | Status: AC
  Administered 2017-11-05 (×2): via INTRAVENOUS

## 2017-11-05 MED ORDER — PIPERACILLIN-TAZOBACTAM 3.375 G IVPB 30 MIN
3.3750 g | Freq: Once | INTRAVENOUS | Status: AC
  Administered 2017-11-05: 3.375 g via INTRAVENOUS
  Filled 2017-11-05: qty 50

## 2017-11-05 MED ORDER — INSULIN ASPART 100 UNIT/ML ~~LOC~~ SOLN
0.0000 [IU] | Freq: Three times a day (TID) | SUBCUTANEOUS | Status: DC
  Administered 2017-11-05: 2 [IU] via SUBCUTANEOUS
  Administered 2017-11-05: 3 [IU] via SUBCUTANEOUS
  Administered 2017-11-05 – 2017-11-06 (×2): 2 [IU] via SUBCUTANEOUS
  Administered 2017-11-06 – 2017-11-07 (×3): 3 [IU] via SUBCUTANEOUS
  Administered 2017-11-07: 5 [IU] via SUBCUTANEOUS
  Administered 2017-11-07: 1 [IU] via SUBCUTANEOUS
  Administered 2017-11-08: 3 [IU] via SUBCUTANEOUS
  Administered 2017-11-08: 2 [IU] via SUBCUTANEOUS
  Filled 2017-11-05: qty 1

## 2017-11-05 MED ORDER — IOHEXOL 300 MG/ML  SOLN
75.0000 mL | Freq: Once | INTRAMUSCULAR | Status: AC | PRN
  Administered 2017-11-05: 75 mL via INTRAVENOUS

## 2017-11-05 MED ORDER — ACETAMINOPHEN 650 MG RE SUPP: 650.0000 mg | Freq: Four times a day (QID) | RECTAL | Status: DC | PRN

## 2017-11-05 MED ORDER — ONDANSETRON HCL 4 MG PO TABS: 4.0000 mg | ORAL_TABLET | Freq: Four times a day (QID) | ORAL | Status: DC | PRN

## 2017-11-05 MED ORDER — GABAPENTIN 300 MG PO CAPS
300.0000 mg | ORAL_CAPSULE | Freq: Three times a day (TID) | ORAL | Status: DC
  Filled 2017-11-05: qty 1

## 2017-11-05 MED ORDER — PIPERACILLIN-TAZOBACTAM 3.375 G IVPB
3.3750 g | Freq: Three times a day (TID) | INTRAVENOUS | Status: DC
  Administered 2017-11-05 – 2017-11-06 (×3): 3.375 g via INTRAVENOUS
  Filled 2017-11-05 (×5): qty 50

## 2017-11-05 MED ORDER — HYDROCODONE-ACETAMINOPHEN 5-325 MG PO TABS: 1.0000 | ORAL_TABLET | Freq: Four times a day (QID) | ORAL | 0 refills | Status: DC | PRN

## 2017-11-05 MED ORDER — MORPHINE SULFATE (PF) 4 MG/ML IV SOLN
2.0000 mg | INTRAVENOUS | Status: DC | PRN
Start: 2017-11-05 — End: 2017-11-08
  Administered 2017-11-05 – 2017-11-07 (×7): 2 mg via INTRAVENOUS
  Filled 2017-11-05 (×8): qty 1

## 2017-11-05 NOTE — Progress Notes (Signed)
Received patient from ED. Alert oriented x4. Facial edema noted with blister on right cheek. Wife at beside. Will continue to monitor.

## 2017-11-05 NOTE — ED Notes (Signed)
Pt has eaten breakfast; no needs at this time.

## 2017-11-05 NOTE — Progress Notes (Signed)
Pharmacy Antibiotic Note  Logan Weiss is a 47 y.o. male admitted on 11/04/2017 with cellulitis.  Pharmacy has been consulted for Vancocin and Zosyn dosing.  Plan: Vancomycin  IV every 12 hours.  Goal trough 10-15 mcg/mL. Zosyn 3.375g IV q8h (4 hour infusion).  Height:  (167.6 cm) Weight: 190 lb (86.2 kg) IBW/kg (Calculated) : 63.8  Temp (24hrs), Avg:99.9 F (37.7 C), Min:99 F (37.2 C), Max:100.8 F (38.2 C)  Recent Labs  Lab 11/04/17 1949 11/04/17 2013 11/04/17 2301 11/05/17 0213 11/05/17 0443  WBC 6.6  --   --   --   --   CREATININE 0.83  --   --   --   --   LATICACIDVEN  --  2.29* 3.62* 2.41* 1.84    Estimated Creatinine Clearance: 115.7 mL/min (by C-G formula based on SCr of 0.83 mg/dL).    Allergies  Allergen Reactions  . Doxycycline Hives     Thank you for allowing pharmacy to be a part of this patient's care.  Vernard Gambles, PharmD, BCPS  11/05/2017 6:11 AM

## 2017-11-05 NOTE — ED Notes (Signed)
Meal tray ordered for patient.

## 2017-11-05 NOTE — ED Notes (Signed)
RN follow-up with patient advised breakfast still has not been delivered.

## 2017-11-05 NOTE — H&P (Signed)
History and Physical    Logan Weiss ZDG:[id number] DOB: January 06, 1971 DOA: 11/04/2017  PCP: [facility]  Patient coming from: Home.  Chief Complaint: Facial pain and swelling.  HPI: Logan Weiss is a 47 y.o. male with diabetes mellitus type 2 tobacco abuse presents to the ER because of worsening swelling around both malar area of the face since last evening.  It has been rapidly spreading.  Denies any trauma or insect bite.  States his blood sugar has been remaining high despite taking medications.  Does not recall his hemoglobin A1c.  ED Course: In the ER patient was initially given clindamycin.  Despite which patient swelling has been worsening.  CT of the maxillofacial area shows small thickening elevated which shows possible developing cellulitis no definite abscess.  Since patient has worsening cellulitis involving the face with history of diabetes admitted for further observation with IV antibiotics.  Review of Systems: As per HPI, rest all negative.   Past Medical History:  Diagnosis Date  . Diabetes ([place])    new dx    Past Surgical History:  Procedure Laterality Date  . APPENDECTOMY    . right knee surgery       reports that he has quit smoking. He smoked 0.00 packs per day for 5.00 years. He has never used smokeless tobacco. He reports that he does not drink alcohol or use drugs.  Allergies  Allergen Reactions  . Doxycycline Hives    Family History  Problem Relation Age of Onset  . Hyperlipidemia Mother   . Mental retardation Mother   . Diabetes Mother   . Diabetes Father   . Hyperlipidemia Father   . Hypertension Father   . Diabetes Maternal Grandmother   . Diabetes Maternal Grandfather   . Diabetes Paternal Grandmother   . Diabetes Paternal Grandfather     Prior to Admission medications   Medication Sig Start Date End Date Taking? Authorizing Provider  amoxicillin-clavulanate (AUGMENTIN) 875-125 MG tablet Take 1 tablet by mouth  every 12 (twelve) hours. 08/06/17   Antonietta Breach, PA-C  butalbital-acetaminophen-caffeine (FIORICET, ESGIC) [phone] MG tablet Take 1 tablet by mouth every 6 (six) hours as needed for headache. 11/20/16 11/20/17  Virgel Manifold, MD  cephALEXin (KEFLEX) 500 MG capsule Take 1 capsule (500 mg total) by mouth 4 (four) times daily. 01/17/17   Clayton Bibles, PA-C  cyclobenzaprine (FLEXERIL) 10 MG tablet Take 1 tablet (10 mg total) by mouth 2 (two) times daily as needed for muscle spasms. 03/21/17   Recardo Evangelist, PA-C  gabapentin (NEURONTIN) 300 MG capsule Take 1 capsule (300 mg total) by mouth 3 (three) times daily. 03/21/17   Recardo Evangelist, PA-C  glipiZIDE (GLUCOTROL XL) 10 MG 24 hr tablet Take 10 mg by mouth daily with breakfast.    [provider]  glipiZIDE (GLUCOTROL) 5 MG tablet Take 1 tablet (5 mg total) by mouth daily before breakfast. 10/10/16   Caudill, Gwynneth Aliment, MD  GlucoCom Lancets MISC Check blood sugar TID & QHS 06/22/14   Advani, Vernon Prey, MD  glucose blood (CHOICE DM FORA G20 TEST STRIPS) test strip Use as instructed 06/22/14   Advani, Vernon Prey, MD  glucose monitoring kit (FREESTYLE) monitoring kit 1 each by Does not apply route as needed for other. 06/22/14   Lorayne Marek, MD  ibuprofen (ADVIL,MOTRIN) 600 MG tablet Take 1 tablet (600 mg total) by mouth every 6 (six) hours as needed. 03/21/17   Recardo Evangelist, PA-C  magic mouthwash  SOLN Take 5 mLs daily as needed by mouth for mouth pain. 05/17/17   Langston Masker B, PA-C  metFORMIN (GLUCOPHAGE) 500 MG tablet Take 1 tablet (500 mg total) by mouth 2 (two) times daily with a meal. 06/05/14   Le, Thao P, DO  naproxen (NAPROSYN) 375 MG tablet Take 1 tablet (375 mg total) by mouth 2 (two) times daily. 07/29/17   Charlann Lange, PA-C  potassium chloride SA (K-DUR,KLOR-CON) 20 MEQ tablet Take 1 tablet (20 mEq total) by mouth 2 (two) times daily for 3 days. 07/29/17 08/06/22  Charlann Lange, PA-C  sulfamethoxazole-trimethoprim  (BACTRIM DS,SEPTRA DS) 800-160 MG tablet Take 1 tablet by mouth 2 (two) times daily.    [provider]  sulfamethoxazole-trimethoprim (BACTRIM DS,SEPTRA DS) 800-160 MG tablet Take 1 tablet by mouth 2 (two) times daily. 08/21/17   Wallene Huh, DPM  terbinafine (LAMISIL) 250 MG tablet Please take one a day x 7days, repeat every 4 weeks x 4 months 08/14/17   Wallene Huh, DPM    Physical Exam: Vitals:   11/05/17 0400 11/05/17 0415 11/05/17 0500 11/05/17 0515  BP: (!) 147/86 (!) 157/93 (!) 156/81 (!) 155/89  Pulse: 82 77 86 76  Resp:      Temp:      TempSrc:      SpO2: 96% 97% 100% 99%  Weight:      Height:          Constitutional: Moderately built and nourished. Vitals:   11/05/17 0400 11/05/17 0415 11/05/17 0500 11/05/17 0515  BP: (!) 147/86 (!) 157/93 (!) 156/81 (!) 155/89  Pulse: 82 77 86 76  Resp:      Temp:      TempSrc:      SpO2: 96% 97% 100% 99%  Weight:      Height:       Eyes: Anicteric no pallor. ENMT: Erythema and induration around the eyes and both malar areas.  Able to open both eyes with no difficulty moving them. Neck: No neck rigidity no mass felt. Respiratory: No rhonchi or crepitations. Cardiovascular: S1-S2 heard no murmurs appreciated. Abdomen: Soft nontender bowel sounds present. Musculoskeletal: No edema.  No joint effusion. Skin: Erythema and induration around both eyes and marrow edema. Neurologic: Alert awake oriented to time place and person.  Moves all extremities. Psychiatric: Appears normal.  Normal affect.   Labs on Admission: I have personally reviewed following labs and imaging studies  CBC: Recent Labs  Lab 11/04/17 1949  WBC 6.6  NEUTROABS 3.8  HGB 17.9*  HCT 51.5  MCV 94.0  PLT 716*   Basic Metabolic Panel: Recent Labs  Lab 11/04/17 1949  NA 140  K 3.8  CL 100*  CO2 30  GLUCOSE 113*  BUN 6  CREATININE 0.83  CALCIUM 9.8   GFR: Estimated Creatinine Clearance: 115.7 mL/min (by C-G formula based on SCr  of 0.83 mg/dL). Liver Function Tests: Recent Labs  Lab 11/04/17 1949  AST 65*  ALT 72*  ALKPHOS 109  BILITOT 0.5  PROT 8.4*  ALBUMIN 4.2   No results for input(s): LIPASE, AMYLASE in the last 168 hours. No results for input(s): AMMONIA in the last 168 hours. Coagulation Profile: No results for input(s): INR, PROTIME in the last 168 hours. Cardiac Enzymes: No results for input(s): CKTOTAL, CKMB, CKMBINDEX, TROPONINI in the last 168 hours. BNP (last 3 results) No results for input(s): PROBNP in the last 8760 hours. HbA1C: No results for input(s): HGBA1C in the last 72 hours.  CBG: No results for input(s): GLUCAP in the last 168 hours. Lipid Profile: No results for input(s): CHOL, HDL, LDLCALC, TRIG, CHOLHDL, LDLDIRECT in the last 72 hours. Thyroid Function Tests: No results for input(s): TSH, T4TOTAL, FREET4, T3FREE, THYROIDAB in the last 72 hours. Anemia Panel: No results for input(s): VITAMINB12, FOLATE, FERRITIN, TIBC, IRON, RETICCTPCT in the last 72 hours. Urine analysis:    Component Value Date/Time   COLORURINE YELLOW 11/04/2017 1924   APPEARANCEUR CLEAR 11/04/2017 1924   LABSPEC 1.023 11/04/2017 1924   PHURINE 6.0 11/04/2017 1924   GLUCOSEU NEGATIVE 11/04/2017 1924   HGBUR NEGATIVE 11/04/2017 1924   BILIRUBINUR NEGATIVE 11/04/2017 [place] NEGATIVE 11/04/2017 1924   PROTEINUR NEGATIVE 11/04/2017 1924   UROBILINOGEN 1.0 07/19/2010 2258   NITRITE NEGATIVE 11/04/2017 1924   LEUKOCYTESUR NEGATIVE 11/04/2017 1924   Sepsis Labs: '@LABRCNTIP'$ (procalcitonin:4,lacticidven:4) )No results found for this or any previous visit (from the past 240 hour(s)).   Radiological Exams on Admission: Dg Chest 2 View  Result Date: 11/04/2017 CLINICAL DATA:  Flu like symptoms EXAM: CHEST - 2 VIEW COMPARISON:  07/28/2017 FINDINGS: The heart size and mediastinal contours are within normal limits. Both lungs are clear. Degenerative changes of the spine. Mild chronic compression  lower thoracic vertebra. IMPRESSION: No active cardiopulmonary disease. Electronically Signed   By: Donavan Foil M.D.   On: 11/04/2017 20:10   Ct Maxillofacial W Contrast  Result Date: 11/05/2017 CLINICAL DATA:  Initial evaluation for facial cellulitis. EXAM: CT MAXILLOFACIAL WITH CONTRAST TECHNIQUE: Multidetector CT imaging of the maxillofacial structures was performed with intravenous contrast. Multiplanar CT image reconstructions were also generated. CONTRAST:  6m OMNIPAQUE IOHEXOL 300 MG/ML  SOLN COMPARISON:  Prior CT from 05/17/2017. FINDINGS: Osseous: No acute osseous abnormality about the face. No acute abnormality about the remaining dentition. Orbits: Globes and orbital soft tissues within normal limits. No evidence for significant pre or postseptal cellulitis. Sinuses: Paranasal sinuses are clear. Right mastoid effusion noted, similar to previous, likely chronic. No nasopharyngeal mass. Calcific density within the right parapharyngeal fat may reflect a small calcified lymph node, stable from previous. Soft tissues: Small approximate 12 mm focus of fat stranding within the subcutaneous fat of the right face, involving the right masticator space (series 3, image 43). Finding is nonspecific, but could reflect a small focus of early and/or mild cellulitis. No other significant inflammatory changes identified about the face. No abscess or loculated fluid collection. Limited intracranial: Unremarkable. IMPRESSION: 1. Small approximate 12 mm focus of fat stranding within the subcutaneous fat of the right cheek/face as above, nonspecific, but could reflect the sequelae of mild and/or early developing cellulitis. Correlation with physical exam recommended. 2. No other acute abnormality about the face. 3. Right mastoid effusion, similar to previous, likely chronic. Electronically Signed   By: BJeannine BogaM.D.   On: 11/05/2017 01:13     Assessment/Plan Principal Problem:   Cellulitis,  face Active Problems:   Type 2 diabetes mellitus with hyperglycemia (HCC)   Elevated blood pressure reading   Cellulitis    1. Facial cellulitis -since its rapidly spreading we will keep patient on vancomycin and Zosyn follow cultures.  Closely observe.  No signs of any definite abscess in the CAT scan.  Patient has no difficulty moving his eyes. 2. Diabetes mellitus type 2 with hyperglycemia -does not recall his last hemoglobin A1c.  We will check hemoglobin A1c I have placed patient on Lantus 5 units with sliding scale coverage.  Closely follow CBGs with sliding scale  coverage. 3. Tobacco abuse -tobacco cessation counseling requested. 4. Elevated blood pressure reading -closely follow blood pressure trends.   DVT prophylaxis: SCDs in anticipation of possible procedure. Code Status: Full code. Family Communication: Patient's family at the bedside. Disposition Plan: Home. Consults called: None. Admission status: Observation.   Rise Patience MD [facility] Pager [phone].  If 7PM-7AM, please contact night-coverage [email or web address] Password [facility]  11/05/2017, 6:04 AM

## 2017-11-05 NOTE — Progress Notes (Signed)
Inpatient Diabetes Program Recommendations  AACE/ADA: New Consensus Statement on Inpatient Glycemic Control (2015)  Target Ranges:  Prepandial:   less than 140 mg/dL      Peak postprandial:   less than 180 mg/dL (1-2 hours)      Critically ill patients:  140 - 180 mg/dL  Results for Logan Weiss, Logan Weiss (MRN [id number]) as of 11/05/2017 11:04  Ref. Range 11/04/2017 19:49 11/05/2017 06:46  Glucose Latest Ref Range: 65 - 99 mg/dL 409 (H) 811 (H)   Results for Logan Weiss, Logan Weiss (MRN [id number]) as of 11/05/2017 11:04  Ref. Range 11/05/2017 09:19  Glucose-Capillary Latest Ref Range: 65 - 99 mg/dL 213 (H)   Review of Glycemic Control  Diabetes history: DM2 Outpatient Diabetes medications: Glipizide XL 10 mg QAM Current orders for Inpatient glycemic control: Lantus 5 units daily, Novolog 0-9 units TID with meals  Inpatient Diabetes Program Recommendations: HgbA1C: A1C in process.  NOTE: Received call from RN regarding insulin orders and patient and family questions regarding DM control. Spoke with patient regarding DM control in the hospital settings and importance of glycemic control especially with cellulits. Explained that usually oral DM medications are not continued while inpatient and insulin is used for inpatient glycemic control as it is more predictable than oral DM medications. Discussed Lantus and Novolog insulin as ordered and patient is agreeable to taking insulin while inpatient. Patient states that his wife was concerned about him being discharged new to insulin. Explained that glycemic trends will be followed and A1C results evaluated by attending MD to determine if they would feel patient needed to be on insulin as an outpatient or not. Patient states that his last A1C was elevated and he was suppose to have his A1C drawn tomorrow on 11/06/17 but he will have to reschedule since he is being admitted to the hospital. Patient reports that when his A1C was elevated at last PCP visit that his Glipizide  dose was increased from 5 mg to 10 mg daily. Informed patient that MD has ordered current A1C. Encouraged patient to ask about A1C results while inpatient if he is not informed of the results prior to discharge. Patient's wife was not in the room at the time but patient states that he will talk with his wife and explain to her why insulin is being used as an inpatient. Patient verbalized understanding of information discussed and he reports that he has no further questions or concerns at this time regarding DM or insulin. Talked with Lanora Manis, RN regarding conversation.  Thanks, Orlando Penner, RN, MSN, CDE Diabetes Coordinator Inpatient Diabetes Program [phone] (Team Pager from 8am to 5pm)

## 2017-11-05 NOTE — ED Notes (Signed)
Diabetes coordinator requested to see patient to provide education. Family member at bedside has lots of questions about insulin and why he is getting it since not been on insulin in past. RN provided education but diabetes coordinator may help reinforce.

## 2017-11-05 NOTE — ED Notes (Signed)
Diabetes coordinator at bedside. 

## 2017-11-05 NOTE — Progress Notes (Signed)
47 year old male with history of diabetes type 2, tobacco use admitted this morning with facial cellulitis bilateral.  He reports that he has had this kind of facial cellulitis in the past.  He denies any trauma or bug bite.  He said he woke up with his face fallen.  He started on IV antibiotics.  CT scan did not reveal any evidence of abscess but shows developing cellulitis.  He is placed on Vanco Zosyn and clindamycin.  Lantus and insulin has been started he is only on p.o. medications at home. hemoglobin A1c is pending at this time.

## 2017-11-05 NOTE — ED Notes (Signed)
Attempted report 

## 2017-11-05 NOTE — ED Notes (Signed)
Patient transported to CT 

## 2017-11-06 ENCOUNTER — Other Ambulatory Visit: Payer: Self-pay

## 2017-11-06 ENCOUNTER — Encounter (HOSPITAL_COMMUNITY): Payer: Self-pay | Admitting: General Practice

## 2017-11-06 DIAGNOSIS — Z22322 Carrier or suspected carrier of Methicillin resistant Staphylococcus aureus: Secondary | ICD-10-CM | POA: Diagnosis not present

## 2017-11-06 DIAGNOSIS — L0390 Cellulitis, unspecified: Secondary | ICD-10-CM

## 2017-11-06 DIAGNOSIS — L03211 Cellulitis of face: Principal | ICD-10-CM

## 2017-11-06 DIAGNOSIS — E876 Hypokalemia: Secondary | ICD-10-CM | POA: Diagnosis present

## 2017-11-06 DIAGNOSIS — E872 Acidosis: Secondary | ICD-10-CM | POA: Diagnosis present

## 2017-11-06 DIAGNOSIS — E669 Obesity, unspecified: Secondary | ICD-10-CM | POA: Diagnosis present

## 2017-11-06 DIAGNOSIS — Z87891 Personal history of nicotine dependence: Secondary | ICD-10-CM | POA: Diagnosis not present

## 2017-11-06 DIAGNOSIS — E1165 Type 2 diabetes mellitus with hyperglycemia: Secondary | ICD-10-CM | POA: Diagnosis present

## 2017-11-06 DIAGNOSIS — Z7984 Long term (current) use of oral hypoglycemic drugs: Secondary | ICD-10-CM | POA: Diagnosis not present

## 2017-11-06 DIAGNOSIS — Z881 Allergy status to other antibiotic agents status: Secondary | ICD-10-CM | POA: Diagnosis not present

## 2017-11-06 DIAGNOSIS — M545 Low back pain: Secondary | ICD-10-CM | POA: Diagnosis present

## 2017-11-06 DIAGNOSIS — R7989 Other specified abnormal findings of blood chemistry: Secondary | ICD-10-CM | POA: Diagnosis present

## 2017-11-06 DIAGNOSIS — Z79899 Other long term (current) drug therapy: Secondary | ICD-10-CM | POA: Diagnosis not present

## 2017-11-06 DIAGNOSIS — F17290 Nicotine dependence, other tobacco product, uncomplicated: Secondary | ICD-10-CM | POA: Diagnosis present

## 2017-11-06 DIAGNOSIS — R030 Elevated blood-pressure reading, without diagnosis of hypertension: Secondary | ICD-10-CM | POA: Diagnosis present

## 2017-11-06 DIAGNOSIS — Z6830 Body mass index (BMI) 30.0-30.9, adult: Secondary | ICD-10-CM | POA: Diagnosis not present

## 2017-11-06 DIAGNOSIS — G8929 Other chronic pain: Secondary | ICD-10-CM | POA: Diagnosis present

## 2017-11-06 HISTORY — DX: Cellulitis, unspecified: L03.90

## 2017-11-06 LAB — MRSA PCR SCREENING: MRSA BY PCR: POSITIVE — AB

## 2017-11-06 LAB — GLUCOSE, CAPILLARY
GLUCOSE-CAPILLARY: 276 mg/dL — AB (ref 65–99)
Glucose-Capillary: 196 mg/dL — ABNORMAL HIGH (ref 65–99)
Glucose-Capillary: 218 mg/dL — ABNORMAL HIGH (ref 65–99)
Glucose-Capillary: 226 mg/dL — ABNORMAL HIGH (ref 65–99)
Glucose-Capillary: 215 mg/dL — ABNORMAL HIGH (ref 65–99)

## 2017-11-06 LAB — BASIC METABOLIC PANEL
ANION GAP: 9 (ref 5–15)
BUN: <5 mg/dL — ABNORMAL LOW (ref 6–20)
CALCIUM: 8.6 mg/dL — AB (ref 8.9–10.3)
CO2: 29 mmol/L (ref 22–32)
CREATININE: 0.73 mg/dL (ref 0.61–1.24)
Chloride: 101 mmol/L (ref 101–111)
Glucose, Bld: 169 mg/dL — ABNORMAL HIGH (ref 65–99)
Potassium: 3.3 mmol/L — ABNORMAL LOW (ref 3.5–5.1)
Sodium: 139 mmol/L (ref 135–145)

## 2017-11-06 LAB — RAPID URINE DRUG SCREEN, HOSP PERFORMED
Amphetamines: NOT DETECTED
Barbiturates: NOT DETECTED
Benzodiazepines: NOT DETECTED
Cocaine: NOT DETECTED
Opiates: NOT DETECTED
Tetrahydrocannabinol: NOT DETECTED

## 2017-11-06 LAB — CBC WITH DIFFERENTIAL/PLATELET
BASOS ABS: 0 10*3/uL (ref 0.0–0.1)
BASOS PCT: 1 %
EOS ABS: 0.3 10*3/uL (ref 0.0–0.7)
EOS PCT: 4 %
HCT: 41.3 % (ref 39.0–52.0)
Hemoglobin: 13.8 g/dL (ref 13.0–17.0)
Lymphocytes Relative: 44 %
Lymphs Abs: 2.9 10*3/uL (ref 0.7–4.0)
MCH: 31.2 pg (ref 26.0–34.0)
MCHC: 33.4 g/dL (ref 30.0–36.0)
MCV: 93.4 fL (ref 78.0–100.0)
MONOS PCT: 8 %
Monocytes Absolute: 0.6 10*3/uL (ref 0.1–1.0)
Neutro Abs: 2.8 10*3/uL (ref 1.7–7.7)
Neutrophils Relative %: 43 %
PLATELETS: 122 10*3/uL — AB (ref 150–400)
RBC: 4.42 MIL/uL (ref 4.22–5.81)
RDW: 13.7 % (ref 11.5–15.5)
WBC: 6.7 10*3/uL (ref 4.0–10.5)

## 2017-11-06 MED ORDER — CHLORHEXIDINE GLUCONATE CLOTH 2 % EX PADS
6.0000 | MEDICATED_PAD | Freq: Every day | CUTANEOUS | Status: DC
  Administered 2017-11-07 – 2017-11-08 (×2): 6 via TOPICAL

## 2017-11-06 MED ORDER — PIPERACILLIN-TAZOBACTAM 3.375 G IVPB
3.3750 g | Freq: Three times a day (TID) | INTRAVENOUS | Status: DC
  Administered 2017-11-06 – 2017-11-08 (×6): 3.375 g via INTRAVENOUS
  Filled 2017-11-06 (×8): qty 50

## 2017-11-06 MED ORDER — INSULIN ASPART 100 UNIT/ML ~~LOC~~ SOLN
7.0000 [IU] | Freq: Once | SUBCUTANEOUS | Status: AC
  Administered 2017-11-06: 7 [IU] via SUBCUTANEOUS

## 2017-11-06 MED ORDER — MUPIROCIN 2 % EX OINT
1.0000 "application " | TOPICAL_OINTMENT | Freq: Two times a day (BID) | CUTANEOUS | Status: DC
  Administered 2017-11-06 – 2017-11-08 (×4): 1 via NASAL
  Filled 2017-11-06: qty 22

## 2017-11-06 MED ORDER — POTASSIUM CHLORIDE CRYS ER 20 MEQ PO TBCR
40.0000 meq | EXTENDED_RELEASE_TABLET | Freq: Once | ORAL | Status: AC
  Administered 2017-11-06: 40 meq via ORAL
  Filled 2017-11-06: qty 2

## 2017-11-06 NOTE — Plan of Care (Addendum)
  RD consulted for nutrition education regarding diabetes.   Lab Results  Component Value Date   HGBA1C 6.9 06/05/2014   Recent Hgb A1c pending; pt unsure of last Hgb A1c.   Spoke with pt at bedside, who reports he was diagnosed with DM approximately 2 years ago. Pt reports that he was "borderline" for several years prior to diagnosis. Pt with a strong family history of DM. He reports inconsistent glycemic control during diagnosis, particularly during first year of diagnosis, related to lack of health insurance. When he was first diagnosed, he reports he was placed on tradjenta initially and was compliant during a 6 month medication assistance program. Pt was then placed on metformin, which he tolerated poorly related to GI distress. Pt was then placed on glipizide- home DM medication regimen is 10 mg glipizide daily (dose was increased from 5 mg to 10 mg in December 2018 at last PCP appointment due to elevated Hgb A1c). Pt has access to a glucometer and test strips; he denies any difficulty affording or obtaining medications, especially since he now has health insurance.   Pt reports that he is trying to better manage his DM overall by remembering to monitor CBGS, take medications, and make better food choices. PTA, pt was consuming 3 meals per day (Breakfast: cheese, boiled egg, green tea with honey and ginger root, Lunch: grilled chicken salad from home, pizza or burger, Dinner: grilled chicken salad). Commonly consumed beverages are water, [employer], [employer], and Gatorade. He also has difficulty controlling intake of sweets during Sunday family dinners. Pt was very engaged in conversation and asked many good questions. Provided pt with comprehensive diabetes self-management education, focusing on importance of monitoring CBGS at least once daily, transitioning towards a medication schedule routine (suggested that pt put a reminder on his phone to assist him in remembering to take medication),  low calorie beverages, and suggestions to choosing healthier items when eating out.   Pt denies receiving any DM education since being diagnosed; he is interested in attending education classes as an outpatient to better control his DM. RD ordered outpatient diabetes education via [facility] (pt resides in [place] and works in [employer]).  Discussed how inpatient management of glycemic control (primarily insulin) to provide tighter control in light of acute illness (including affect of infection/acute illness on blood sugars). Assured pt that he would receive instructions regarding outpatient medications illness upon discharge.   Case discussed with RN, who reports pt is awaiting DM coordinator consult.   RD provided "Carbohydrate Counting for People with Diabetes" handout from the Academy of Nutrition and Dietetics. Discussed different food groups and their effects on blood sugar, emphasizing carbohydrate-containing foods. Provided list of carbohydrates and recommended serving sizes of common foods.  Discussed importance of controlled and consistent carbohydrate intake throughout the day. Provided examples of ways to balance meals/snacks and encouraged intake of high-fiber, whole grain complex carbohydrates. Teach back method used.  Expect fair to good compliance.  Body mass index is 30.67 kg/m. Pt meets criteria for obesity, class I based on current BMI.  Current diet order is carb modified, patient is consuming approximately 100% of meals at this time. Labs and medications reviewed. No further nutrition interventions warranted at this time. RD contact information provided. If additional nutrition issues arise, please re-consult RD.  Tashanti Dalporto A. Mayford Knife, RD, LDN, CDE Pager: [phone] After hours Pager: [phone]

## 2017-11-06 NOTE — Plan of Care (Signed)
  Problem: Pain Managment: Goal: General experience of comfort will improve Outcome: Progressing   Problem: Activity: Goal: Risk for activity intolerance will decrease Outcome: Adequate for Discharge   Problem: Nutrition: Goal: Adequate nutrition will be maintained Outcome: Adequate for Discharge   Problem: Coping: Goal: Level of anxiety will decrease Outcome: Adequate for Discharge   Problem: Elimination: Goal: Will not experience complications related to bowel motility Outcome: Adequate for Discharge Goal: Will not experience complications related to urinary retention Outcome: Adequate for Discharge   Problem: Safety: Goal: Ability to remain free from injury will improve Outcome: Adequate for Discharge

## 2017-11-06 NOTE — Progress Notes (Signed)
PROGRESS NOTE  Logan Weiss:[id number] DOB: 03-09-1971 DOA: 11/04/2017 PCP: [facility]  HPI/Recap of past 24 hours: C/o facial pain, facial blisters No fever  Assessment/Plan: Principal Problem:   Cellulitis, face Active Problems:   Type 2 diabetes mellitus with hyperglycemia (HCC)   Elevated blood pressure reading   Cellulitis  Facial cellulitis with lactic acidosis -ct maillofacial no deep abscess -since its rapidly spreading we will keep patient on vancomycin and Zosyn follow cultures.  - he has h/o doxycycline intolerance due to gi side effect, he reports tolerated bactrim in the past, plan to d/c on bactrim once cellulitis improves on iv abx  noninsulin dependent dm2,  a1c pending, On ssi here  Hypokalemia: replace k, check mag  Mild elevated liver function From infection? Will check hepatitis panel   Body mass index is 30.67 kg/m.  Code Status: full  Family Communication: patient   Disposition Plan: home in 1-2 days pending cellulitis improvement   Consultants:  none  Procedures:  none  Antibiotics:  As above   Objective: BP 138/81 (BP Location: Left Arm)   Pulse 71   Temp 98.2 F (36.8 C) (Oral)   Resp 18   Ht  (1.676 m)   Wt 86.2 kg (190 lb)   SpO2 99%   BMI 30.67 kg/m   Intake/Output Summary (Last 24 hours) at 11/06/2017 1022 Last data filed at 11/06/2017 0750 Gross per 24 hour  Intake 1523.25 ml  Output -  Net 1523.25 ml   Filed Weights   11/04/17 1917  Weight: 86.2 kg (190 lb)    Exam: Patient is examined daily including today on 11/06/2017, exams remain the same as of yesterday except that has changed   General:  NAD, Erythema and induration around the eyes and both malar areas, + blisters  Cardiovascular: RRR  Respiratory: CTABL  Abdomen: Soft/ND/NT, positive BS  Musculoskeletal: No Edema  Neuro: alert, oriented   Data Reviewed: Basic Metabolic Panel: Recent Labs  Lab  11/04/17 1949 11/05/17 0646 11/06/17 0259  NA 140 136 139  K 3.8 3.6 3.3*  CL 100* 101 101  CO2 GLUCOSE 113* 183* 169*  BUN 6 5* <5*  CREATININE 0.83 0.88 0.73  CALCIUM 9.8 8.1* 8.6*   Liver Function Tests: Recent Labs  Lab 11/04/17 1949 11/05/17 0646  AST 65* 49*  ALT 72* 57  ALKPHOS 109 95  BILITOT 0.5 0.4  PROT 8.4* 6.1*  ALBUMIN 4.2 3.0*   No results for input(s): LIPASE, AMYLASE in the last 168 hours. No results for input(s): AMMONIA in the last 168 hours. CBC: Recent Labs  Lab 11/04/17 1949 11/05/17 0646 11/06/17 0259  WBC 6.6 6.8 6.7  NEUTROABS 3.8 3.8 2.8  HGB 17.9* 13.7 13.8  HCT 51.5 40.8 41.3  MCV 94.0 93.6 93.4  PLT 138* 114* 122*   Cardiac Enzymes:   No results for input(s): CKTOTAL, CKMB, CKMBINDEX, TROPONINI in the last 168 hours. BNP (last 3 results) No results for input(s): BNP in the last 8760 hours.  ProBNP (last 3 results) No results for input(s): PROBNP in the last 8760 hours.  CBG: Recent Labs  Lab 11/05/17 0919 11/05/17 1257 11/05/17 1708 11/05/17 2149 11/06/17 0754  GLUCAP 158* 209* 179* 239* 196*    No results found for this or any previous visit (from the past 240 hour(s)).   Studies: No results found.  Scheduled Meds: . insulin aspart  0-9 Units Subcutaneous TID WC  .  insulin glargine  5 Units Subcutaneous Daily  . potassium chloride  40 mEq Oral Once    Continuous Infusions: . piperacillin-tazobactam (ZOSYN)  IV 3.375 g (11/06/17 0523)  . vancomycin Stopped (11/06/17 0750)     Time spent: I have personally reviewed and interpreted on  11/06/2017 daily labs, tele strips, imagings as discussed above under date review session and assessment and plans.  I reviewed all nursing notes, pharmacy notes,  vitals, pertinent old records  I have discussed plan of care as described above with RN , patient  on 11/06/2017   Albertine Grates MD, PhD  [facility] Pager [phone]. If 7PM-7AM, please contact  night-coverage at [email or web address], password [facility] 11/06/2017, 10:22 AM  LOS: 0 days

## 2017-11-07 LAB — CBC
HCT: 41.9 % (ref 39.0–52.0)
Hemoglobin: 14.2 g/dL (ref 13.0–17.0)
MCH: 31.6 pg (ref 26.0–34.0)
MCHC: 33.9 g/dL (ref 30.0–36.0)
MCV: 93.1 fL (ref 78.0–100.0)
Platelets: 130 K/uL — ABNORMAL LOW (ref 150–400)
RBC: 4.5 MIL/uL (ref 4.22–5.81)
RDW: 13.5 % (ref 11.5–15.5)
WBC: 6.3 K/uL (ref 4.0–10.5)

## 2017-11-07 LAB — COMPREHENSIVE METABOLIC PANEL
ALBUMIN: 3.2 g/dL — AB (ref 3.5–5.0)
ALK PHOS: 95 U/L (ref 38–126)
ALT: 54 U/L (ref 17–63)
AST: 36 U/L (ref 15–41)
Anion gap: 7 (ref 5–15)
BILIRUBIN TOTAL: 0.5 mg/dL (ref 0.3–1.2)
BUN: 5 mg/dL — AB (ref 6–20)
CO2: 29 mmol/L (ref 22–32)
Calcium: 9.2 mg/dL (ref 8.9–10.3)
Chloride: 103 mmol/L (ref 101–111)
Creatinine, Ser: 0.73 mg/dL (ref 0.61–1.24)
GFR calc Af Amer: >60 mL/min (ref >60)
GFR calc non Af Amer: >60 mL/min (ref >60)
GLUCOSE: 168 mg/dL — AB (ref 65–99)
Potassium: 3.9 mmol/L (ref 3.5–5.1)
Sodium: 139 mmol/L (ref 135–145)
TOTAL PROTEIN: 6.9 g/dL (ref 6.5–8.1)

## 2017-11-07 LAB — GLUCOSE, CAPILLARY
GLUCOSE-CAPILLARY: 148 mg/dL — AB (ref 65–99)
Glucose-Capillary: 201 mg/dL — ABNORMAL HIGH (ref 65–99)
Glucose-Capillary: 257 mg/dL — ABNORMAL HIGH (ref 65–99)
Glucose-Capillary: 257 mg/dL — ABNORMAL HIGH (ref 65–99)

## 2017-11-07 LAB — HEMOGLOBIN A1C
Hgb A1c MFr Bld: 9.9 % — ABNORMAL HIGH (ref 4.8–5.6)
Mean Plasma Glucose: 237.43 mg/dL

## 2017-11-07 LAB — CK: Total CK: 213 U/L (ref 49–397)

## 2017-11-07 LAB — MAGNESIUM: Magnesium: 1.8 mg/dL (ref 1.7–2.4)

## 2017-11-07 MED ORDER — GLIPIZIDE ER 10 MG PO TB24
10.0000 mg | ORAL_TABLET | Freq: Every day | ORAL | Status: DC
  Administered 2017-11-08: 10 mg via ORAL
  Filled 2017-11-07: qty 1

## 2017-11-07 NOTE — Progress Notes (Signed)
PROGRESS NOTE  Logan Weiss ZOX:[id number] DOB: Feb 17, 1971 DOA: 11/04/2017 PCP: Farris Has, MD  HPI/Recap of past 24 hours:  C/o facial pain, facial blisters, appear improving, not able to fully open his eyes No fever  Assessment/Plan: Principal Problem:   Cellulitis, face Active Problems:   Type 2 diabetes mellitus with hyperglycemia (HCC)   Elevated blood pressure reading   Cellulitis  Facial cellulitis with lactic acidosis -ct maillofacial no deep abscess -since its rapidly spreading we will keep patient on vancomycin and Zosyn follow cultures.  - he has h/o doxycycline intolerance due to gi side effect, he reports tolerated bactrim in the past, plan to d/c on bactrim once cellulitis improves on iv abx, hopefully on 5/3  noninsulin dependent dm2,  a1c 9.9 On ssi here Diet education and diabetes education  Hypokalemia: k replaced, mag 1.8  Mild elevated liver function From infection? lft normalized  hepatitis panel in process   Body mass index is 30.67 kg/m.  Code Status: full  Family Communication: patient   Disposition Plan: home in 1-2 days pending cellulitis improvement   Consultants:  none  Procedures:  none  Antibiotics:  As above   Objective: BP (!) 131/99 (BP Location: Left Arm)   Pulse 74   Temp 98.1 F (36.7 C) (Oral)   Resp 20   Ht  (1.676 m)   Wt 86.2 kg (190 lb)   SpO2 97%   BMI 30.67 kg/m   Intake/Output Summary (Last 24 hours) at 11/07/2017 1806 Last data filed at 11/07/2017 1400 Gross per 24 hour  Intake 1460 ml  Output -  Net 1460 ml   Filed Weights   11/04/17 1917  Weight: 86.2 kg (190 lb)    Exam: Patient is examined daily including today on 11/07/2017, exams remain the same as of yesterday except that has changed   General:  NAD, Erythema and induration around the eyes and both malar areas, + blisters, appear improving, now able to open eyes  Cardiovascular: RRR  Respiratory: CTABL  Abdomen:  Soft/ND/NT, positive BS  Musculoskeletal: No Edema  Neuro: alert, oriented   Data Reviewed: Basic Metabolic Panel: Recent Labs  Lab 11/04/17 1949 11/05/17 0646 11/06/17 0259 11/07/17 0507  NA 140 136 139 139  K 3.8 3.6 3.3* 3.9  CL 100* 101 101 103  CO2 GLUCOSE 113* 183* 169* 168*  BUN 6 5* <5* 5*  CREATININE 0.83 0.88 0.73 0.73  CALCIUM 9.8 8.1* 8.6* 9.2  MG  --   --   --  1.8   Liver Function Tests: Recent Labs  Lab 11/04/17 1949 11/05/17 0646 11/07/17 0507  AST 65* 49* 36  ALT 72* 57 54  ALKPHOS 109 95 95  BILITOT 0.5 0.4 0.5  PROT 8.4* 6.1* 6.9  ALBUMIN 4.2 3.0* 3.2*   No results for input(s): LIPASE, AMYLASE in the last 168 hours. No results for input(s): AMMONIA in the last 168 hours. CBC: Recent Labs  Lab 11/04/17 1949 11/05/17 0646 11/06/17 0259 11/07/17 0507  WBC 6.6 6.8 6.7 6.3  NEUTROABS 3.8 3.8 2.8  --   HGB 17.9* 13.7 13.8 14.2  HCT 51.5 40.8 41.3 41.9  MCV 94.0 93.6 93.4 93.1  PLT 138* 114* 122* 130*   Cardiac Enzymes:   Recent Labs  Lab 11/07/17 0507  CKTOTAL 213   BNP (last 3 results) No results for input(s): BNP in the last 8760 hours.  ProBNP (last 3 results) No results for input(s):  PROBNP in the last 8760 hours.  CBG: Recent Labs  Lab 11/06/17 1955 11/06/17 2207 11/07/17 0733 11/07/17 1140 11/07/17 1706  GLUCAP 276* 215* 201* 257* 148*    Recent Results (from the past 240 hour(s))  MRSA PCR Screening     Status: Abnormal   Collection Time: 11/06/17 11:05 AM  Result Value Ref Range Status   MRSA by PCR POSITIVE (A) NEGATIVE Final    Comment:        The GeneXpert MRSA Assay (FDA approved for NASAL specimens only), is one component of a comprehensive MRSA colonization surveillance program. It is not intended to diagnose MRSA infection nor to guide or monitor treatment for MRSA infections. RESULT CALLED TO, READ BACK BY AND VERIFIED WITH: Lilian Coma RN 13:30 11/06/17 ([email or web address])      Studies: No  results found.  Scheduled Meds: . Chlorhexidine Gluconate Cloth  6 each Topical Q0600  . [START ON 11/08/2017] glipiZIDE  10 mg Oral Q breakfast  . insulin aspart  0-9 Units Subcutaneous TID WC  . insulin glargine  5 Units Subcutaneous Daily  . mupirocin ointment  1 application Nasal BID    Continuous Infusions: . piperacillin-tazobactam (ZOSYN)  IV 3.375 g (11/07/17 1643)  . vancomycin 1,500 mg (11/07/17 1803)     Time spent: I have personally reviewed and interpreted on  11/07/2017 daily labs, tele strips, imagings as discussed above under date review session and assessment and plans.  I reviewed all nursing notes, pharmacy notes,  vitals, pertinent old records  I have discussed plan of care as described above with RN , patient  on 11/07/2017   Albertine Grates MD, PhD  [facility] Pager [phone]. If 7PM-7AM, please contact night-coverage at [email or web address], password [facility] 11/07/2017, 6:06 PM  LOS: 1 day

## 2017-11-07 NOTE — Progress Notes (Signed)
Inpatient Diabetes Program Recommendations  AACE/ADA: New Consensus Statement on Inpatient Glycemic Control (2015)  Target Ranges:  Prepandial:   less than 140 mg/dL      Peak postprandial:   less than 180 mg/dL (1-2 hours)      Critically ill patients:  140 - 180 mg/dL   Lab Results  Component Value Date   GLUCAP 257 (H) 11/07/2017   HGBA1C 9.9 (H) 11/07/2017   Inpatient Diabetes Program Recommendations:   Spoke with patient regarding A1C results.  He states that this is high for him.  We discussed plans for him to be d/c'd on oral agent.  We discussed importance of glycemic control long term and while he is healing from infection.  Encouraged him to check his blood sugars closely after discharge and if they are consistently >180 mg/dL, to notify his PCP. Note that patient has referral for outpatient DM education.   Thanks,  Beryl Meager, RN, BC-ADM Inpatient Diabetes Coordinator Pager [phone] (8a-5p)

## 2017-11-08 LAB — GLUCOSE, CAPILLARY
Glucose-Capillary: 184 mg/dL — ABNORMAL HIGH (ref 65–99)
Glucose-Capillary: 242 mg/dL — ABNORMAL HIGH (ref 65–99)

## 2017-11-08 LAB — HEPATITIS PANEL, ACUTE
HCV Ab: <0.1 s/co ratio (ref 0.0–0.9)
HEP A IGM: NEGATIVE
Hep B C IgM: NEGATIVE
Hepatitis B Surface Ag: NEGATIVE

## 2017-11-08 MED ORDER — SULFAMETHOXAZOLE-TRIMETHOPRIM 800-160 MG PO TABS: 1.0000 | ORAL_TABLET | Freq: Two times a day (BID) | ORAL | 0 refills | Status: AC

## 2017-11-08 MED ORDER — CHLORHEXIDINE GLUCONATE CLOTH 2 % EX PADS: 6.0000 | MEDICATED_PAD | Freq: Every day | CUTANEOUS | 0 refills | Status: AC

## 2017-11-08 MED ORDER — HYDROCODONE-ACETAMINOPHEN 5-325 MG PO TABS: 1.0000 | ORAL_TABLET | ORAL | 0 refills | Status: AC | PRN

## 2017-11-08 NOTE — Progress Notes (Signed)
[name] to be D/C'd  per MD order. Discussed with the patient and all questions fully answered.  VSS, Skin clean, dry and intact without evidence of skin break down, no evidence of skin tears noted.  IV catheter discontinued intact. Site without signs and symptoms of complications. Dressing and pressure applied.  An After Visit Summary was printed and given to the patient. Patient informed where to pick up prescription.  D/c education completed with patient/family including follow up instructions, medication list, d/c activities limitations if indicated, with other d/c instructions as indicated by MD - patient able to verbalize understanding, all questions fully answered.   Patient instructed to return to ED, call 911, or call MD for any changes in condition.   Patient D/C home via private auto.

## 2017-11-08 NOTE — Progress Notes (Signed)
ANTIBIOTIC CONSULT NOTE   Pharmacy Consult for Vanco/Zosyn Indication: cellulitis and    Allergies  Allergen Reactions  . Doxycycline Hives    Patient Measurements: Height:  (167.6 cm) Weight: 190 lb (86.2 kg) IBW/kg (Calculated) : 63.8 Adjusted Body Weight:    Vital Signs: Temp: 98 F (36.7 C) (05/03 0444) Temp Source: Oral (05/03 0444) BP: 108/77 (05/03 0444) Pulse Rate: 60 (05/03 0444) Intake/Output from previous day: 05/02 0701 - 05/03 0700 In: 1650 [IV Piggyback:1650] Out: -  Intake/Output from this shift: No intake/output data recorded.  Labs: Recent Labs    11/06/17 0259 11/07/17 0507  WBC 6.7 6.3  HGB 13.8 14.2  PLT 122* 130*  CREATININE 0.73 0.73   Estimated Creatinine Clearance: 120.1 mL/min (by C-G formula based on SCr of 0.73 mg/dL). No results for input(s): VANCOTROUGH, VANCOPEAK, VANCORANDOM, GENTTROUGH, GENTPEAK, GENTRANDOM, TOBRATROUGH, TOBRAPEAK, TOBRARND, AMIKACINPEAK, AMIKACINTROU, AMIKACIN in the last 72 hours.   Microbiology: Recent Results (from the past 720 hour(s))  MRSA PCR Screening     Status: Abnormal   Collection Time: 11/06/17 11:05 AM  Result Value Ref Range Status   MRSA by PCR POSITIVE (A) NEGATIVE Final    Comment:        The GeneXpert MRSA Assay (FDA approved for NASAL specimens only), is one component of a comprehensive MRSA colonization surveillance program. It is not intended to diagnose MRSA infection nor to guide or monitor treatment for MRSA infections. RESULT CALLED TO, READ BACK BY AND VERIFIED WITH: Lilian Coma RN 13:30 11/06/17 ([email or web address])     Medical History: Past Medical History:  Diagnosis Date  . Cellulitis 11/2017   of face   . Diabetes (HCC)    new dx  . History of MRSA infection    Assessment: ID: [id number] for facial cellulitis, was rapidly spreading (facial pain, facial bluster, not able to fully open eyes) - afebrile, WBC WNL, LA down 1.84, no micro. Scr 0.73 Plan: d/c on Bactrim  Vanc 4/30  >> Zosyn 4/30 >>  5/1: MRSA PCR +  Goal of Therapy:  Vancomycin trough level 10-15 mcg/ml  Plan:  Vanc  IV Q12H Zosyn 3.375gm IV Q8H Plan home on Bactrim  Logan Weiss [place], PharmD, BCPS Clinical Staff Pharmacist Pager [phone]  Misty Stanley Stillinger 11/08/2017,8:24 AM

## 2017-11-08 NOTE — Discharge Summary (Signed)
Discharge Summary  Logan Weiss ZOX:[id number] DOB: 04-16-1971  PCP: Farris Has, MD  Admit date: 11/04/2017 Discharge date: 11/08/2017  Time spent: <67mins  Recommendations for Outpatient Follow-up:  1. F/u with PMD within a week  for hospital discharge follow up, repeat cbc/bmp at follow up. pmd to follow up on final hepatitis panel result. 2. Outpatient diabetes education  Discharge Diagnoses:  Active Hospital Problems   Diagnosis Date Noted  . Cellulitis, face 11/05/2017  . Type 2 diabetes mellitus with hyperglycemia (HCC) 11/05/2017  . Elevated blood pressure reading 11/05/2017  . Cellulitis 11/05/2017    Resolved Hospital Problems  No resolved problems to display.    Discharge Condition: stable  Diet recommendation: heart healthy/carb modified  Filed Weights   11/04/17 1917  Weight: 86.2 kg (190 lb)    History of present illness: (per admitting MD Dr Toniann Fail) PCP: [facility]  Patient coming from: Home.  Chief Complaint: Facial pain and swelling.  HPI: Logan Weiss is a 47 y.o. male with diabetes mellitus type 2 tobacco abuse presents to the ER because of worsening swelling around both malar area of the face since last evening.  It has been rapidly spreading.  Denies any trauma or insect bite.  States his blood sugar has been remaining high despite taking medications.  Does not recall his hemoglobin A1c.  ED Course: In the ER patient was initially given clindamycin.  Despite which patient swelling has been worsening.  CT of the maxillofacial area shows small thickening elevated which shows possible developing cellulitis no definite abscess.  Since patient has worsening cellulitis involving the face with history of diabetes admitted for further observation with IV antibiotics.    Hospital Course:  Principal Problem:   Cellulitis, face Active Problems:   Type 2 diabetes mellitus with hyperglycemia (HCC)   Elevated blood pressure  reading   Cellulitis   Facial cellulitiswith lactic acidosis -ct maillofacial no deep abscess -since its rapidly spreading , he is admitted to the hospital and received vancomycin and Zosyn. Blood culture was not obtained prior to abx use. He does not have fever, no leukocytosis. -much improved today, blisters resolved, erythema/tenderness has much improved, he is able to open his eyes - he has h/o doxycycline intolerance due to gi side effect, he reports tolerated bactrim in the past,  d/c on bactrim Ds for another 10days to finish total of 14days treatment.  MRSA colonization: decolonization.  noninsulin dependent dm2,  a1c 9.9 On ssi here Resume home oral meds at discharge Diet education and diabetes education He is motivated to have life style changes. He is to attend outpatient diabetes education, he is to discuss with pcp early next week about adding additional meds for blood glucose control.  Hypokalemia: k replaced, mag 1.8  Mild elevated liver function From cellulitis? lft normalized  hepatitis panel in process   Body mass index is 30.67 kg/m. Has central obesity and likely metabolic syndrome.  Weight loss encouraged.  Code Status: full  Family Communication: patient   Disposition Plan: home    Consultants:  none  Procedures:  none  Antibiotics:  As above    Discharge Exam: BP 108/77 (BP Location: Right Arm)   Pulse 60   Temp 98 F (36.7 C) (Oral)   Resp 16   Ht  (1.676 m)   Wt 86.2 kg (190 lb)   SpO2 97%   BMI 30.67 kg/m   General: NAD, facial blisters resolved, erythema/tenderness has much improved,  he is able to open his eyes Cardiovascular: RRR Respiratory: CTABL  Discharge Instructions You were cared for by a hospitalist during your hospital stay. If you have any questions about your discharge medications or the care you received while you were in the hospital after you are discharged, you can call the unit and  asked to speak with the hospitalist on call if the hospitalist that took care of you is not available. Once you are discharged, your primary care physician will handle any further medical issues. Please note that NO REFILLS for any discharge medications will be authorized once you are discharged, as it is imperative that you return to your primary care physician (or establish a relationship with a primary care physician if you do not have one) for your aftercare needs so that they can reassess your need for medications and monitor your lab values.  Discharge Instructions    Amb Referral to Nutrition and Diabetic E   Complete by:  As directed    Diet - low sodium heart healthy   Complete by:  As directed    Carb modified   Increase activity slowly   Complete by:  As directed      Allergies as of 11/08/2017      Reactions   Doxycycline Hives      Medication List    TAKE these medications   butalbital-acetaminophen-caffeine 50-325-40 MG tablet Commonly known as:  FIORICET, ESGIC Take 1 tablet by mouth every 6 (six) hours as needed for headache.   Chlorhexidine Gluconate Cloth 2 % Pads Apply 6 each topically daily at 6 (six) AM for 3 days. Start taking on:  11/09/2017   cyclobenzaprine 10 MG tablet Commonly known as:  FLEXERIL Take 1 tablet (10 mg total) by mouth 2 (two) times daily as needed for muscle spasms.   glipiZIDE 10 MG 24 hr tablet Commonly known as:  GLUCOTROL XL Take 10 mg by mouth daily with breakfast.   GlucoCom Lancets Misc Check blood sugar TID & QHS   glucose blood test strip Commonly known as:  CHOICE DM FORA G20 TEST STRIPS Use as instructed   HYDROcodone-acetaminophen 5-325 MG tablet Commonly known as:  NORCO Take 1 tablet by mouth every 4 (four) hours as needed for up to 3 days for moderate pain.   sulfamethoxazole-trimethoprim 800-160 MG tablet Commonly known as:  BACTRIM DS,SEPTRA DS Take 1 tablet by mouth 2 (two) times daily for 10 days.       Allergies  Allergen Reactions  . Doxycycline Hives   Follow-up Information    [facility] Follow up in 1 week(s).   Specialty:  Family Medicine Why:  hospital discharge follow up,  pcp to monitor blood glucose control and adjust diabetes medication. Contact information: [address] [place] [address] [phone]        [facility] EMERGENCY DEPARTMENT.   Specialty:  Emergency Medicine Why:  If symptoms worsen Contact information: [address] [id number] mc [place] [address] [phone]       Farris Has, MD Follow up in 1 week(s).   Specialty:  Family Medicine Why:  hospital discharge follow up,  pcp to monitor blood glucose control and adjust diabetes medication. Contact information: [address] [place] [address] [phone]            The results of significant diagnostics from this hospitalization (including imaging, microbiology, ancillary and laboratory) are listed below for  reference.    Significant Diagnostic Studies: Dg Chest 2 View  Result Date: 11/04/2017 CLINICAL DATA:  Flu like symptoms EXAM: CHEST - 2 VIEW COMPARISON:  07/28/2017 FINDINGS: The heart size and mediastinal contours are within normal limits. Both lungs are clear. Degenerative changes of the spine. Mild chronic compression lower thoracic vertebra. IMPRESSION: No active cardiopulmonary disease. Electronically Signed   By: Jasmine Pang M.D.   On: 11/04/2017 20:10   Ct Maxillofacial W Contrast  Result Date: 11/05/2017 CLINICAL DATA:  Initial evaluation for facial cellulitis. EXAM: CT MAXILLOFACIAL WITH CONTRAST TECHNIQUE: Multidetector CT imaging of the maxillofacial structures was performed with intravenous contrast. Multiplanar CT image reconstructions were also generated. CONTRAST:  75mL OMNIPAQUE IOHEXOL 300 MG/ML  SOLN COMPARISON:  Prior CT from 05/17/2017. FINDINGS:  Osseous: No acute osseous abnormality about the face. No acute abnormality about the remaining dentition. Orbits: Globes and orbital soft tissues within normal limits. No evidence for significant pre or postseptal cellulitis. Sinuses: Paranasal sinuses are clear. Right mastoid effusion noted, similar to previous, likely chronic. No nasopharyngeal mass. Calcific density within the right parapharyngeal fat may reflect a small calcified lymph node, stable from previous. Soft tissues: Small approximate 12 mm focus of fat stranding within the subcutaneous fat of the right face, involving the right masticator space (series 3, image 43). Finding is nonspecific, but could reflect a small focus of early and/or mild cellulitis. No other significant inflammatory changes identified about the face. No abscess or loculated fluid collection. Limited intracranial: Unremarkable. IMPRESSION: 1. Small approximate 12 mm focus of fat stranding within the subcutaneous fat of the right cheek/face as above, nonspecific, but could reflect the sequelae of mild and/or early developing cellulitis. Correlation with physical exam recommended. 2. No other acute abnormality about the face. 3. Right mastoid effusion, similar to previous, likely chronic. Electronically Signed   By: Rise Mu M.D.   On: 11/05/2017 01:13    Microbiology: Recent Results (from the past 240 hour(s))  MRSA PCR Screening     Status: Abnormal   Collection Time: 11/06/17 11:05 AM  Result Value Ref Range Status   MRSA by PCR POSITIVE (A) NEGATIVE Final    Comment:        The GeneXpert MRSA Assay (FDA approved for NASAL specimens only), is one component of a comprehensive MRSA colonization surveillance program. It is not intended to diagnose MRSA infection nor to guide or monitor treatment for MRSA infections. RESULT CALLED TO, READ BACK BY AND VERIFIED WITH: Lilian Coma RN 13:30 11/06/17 ([email or web address])      Labs: Basic Metabolic Panel: Recent Labs    Lab 11/04/17 1949 11/05/17 0646 11/06/17 0259 11/07/17 0507  NA 140 136 139 139  K 3.8 3.6 3.3* 3.9  CL 100* 101 101 103  CO2 GLUCOSE 113* 183* 169* 168*  BUN 6 5* <5* 5*  CREATININE 0.83 0.88 0.73 0.73  CALCIUM 9.8 8.1* 8.6* 9.2  MG  --   --   --  1.8   Liver Function Tests: Recent Labs  Lab 11/04/17 1949 11/05/17 0646 11/07/17 0507  AST 65* 49* 36  ALT 72* 57 54  ALKPHOS 109 95 95  BILITOT 0.5 0.4 0.5  PROT 8.4* 6.1* 6.9  ALBUMIN 4.2 3.0* 3.2*   No results for input(s): LIPASE, AMYLASE in the last 168 hours. No results for input(s): AMMONIA in the last 168 hours. CBC: Recent Labs  Lab 11/04/17 1949 11/05/17 0646 11/06/17 0259 11/07/17 0507  WBC 6.6  6.8 6.7 6.3  NEUTROABS 3.8 3.8 2.8  --   HGB 17.9* 13.7 13.8 14.2  HCT 51.5 40.8 41.3 41.9  MCV 94.0 93.6 93.4 93.1  PLT 138* 114* 122* 130*   Cardiac Enzymes: Recent Labs  Lab 11/07/17 0507  CKTOTAL 213   BNP: BNP (last 3 results) No results for input(s): BNP in the last 8760 hours.  ProBNP (last 3 results) No results for input(s): PROBNP in the last 8760 hours.  CBG: Recent Labs  Lab 11/07/17 0733 11/07/17 1140 11/07/17 1706 11/07/17 2142 11/08/17 0748  GLUCAP 201* 257* 148* 257* 184*       Signed:  Albertine Grates MD, PhD  [facility] 11/08/2017, 10:13 AM

## 2017-11-13 ENCOUNTER — Ambulatory Visit: Payer: BLUE CROSS/BLUE SHIELD | Admitting: Podiatry

## 2018-04-13 ENCOUNTER — Encounter (HOSPITAL_COMMUNITY): Payer: Self-pay | Admitting: Emergency Medicine

## 2018-04-13 ENCOUNTER — Emergency Department (HOSPITAL_COMMUNITY): Payer: BLUE CROSS/BLUE SHIELD

## 2018-04-13 ENCOUNTER — Emergency Department (HOSPITAL_COMMUNITY)
Admission: EM | Admit: 2018-04-13 | Discharge: 2018-04-13 | Disposition: A | Discharge status: Home / Self Care | Payer: BLUE CROSS/BLUE SHIELD | Attending: Emergency Medicine | Admitting: Emergency Medicine

## 2018-04-13 DIAGNOSIS — E119 Type 2 diabetes mellitus without complications: Secondary | ICD-10-CM | POA: Insufficient documentation

## 2018-04-13 DIAGNOSIS — Z7984 Long term (current) use of oral hypoglycemic drugs: Secondary | ICD-10-CM | POA: Diagnosis not present

## 2018-04-13 DIAGNOSIS — F17210 Nicotine dependence, cigarettes, uncomplicated: Secondary | ICD-10-CM | POA: Insufficient documentation

## 2018-04-13 DIAGNOSIS — R079 Chest pain, unspecified: Secondary | ICD-10-CM | POA: Insufficient documentation

## 2018-04-13 LAB — HEPATIC FUNCTION PANEL
ALT: 14 U/L (ref 0–44)
AST: 20 U/L (ref 15–41)
Albumin: 3.7 g/dL (ref 3.5–5.0)
Alkaline Phosphatase: 99 U/L (ref 38–126)
BILIRUBIN DIRECT: 0.2 mg/dL (ref 0.0–0.2)
Indirect Bilirubin: 0.6 mg/dL (ref 0.3–0.9)
Total Bilirubin: 0.8 mg/dL (ref 0.3–1.2)
Total Protein: 7.1 g/dL (ref 6.5–8.1)

## 2018-04-13 LAB — CBC
HCT: 46.8 % (ref 39.0–52.0)
Hemoglobin: 15.9 g/dL (ref 13.0–17.0)
MCH: 31.5 pg (ref 26.0–34.0)
MCHC: 34 g/dL (ref 30.0–36.0)
MCV: 92.9 fL (ref 78.0–100.0)
Platelets: 159 10*3/uL (ref 150–400)
RBC: 5.04 MIL/uL (ref 4.22–5.81)
RDW: 11.9 % (ref 11.5–15.5)
WBC: 8.7 10*3/uL (ref 4.0–10.5)

## 2018-04-13 LAB — I-STAT TROPONIN, ED
Troponin i, poc: 0 ng/mL (ref 0.00–0.08)
Troponin i, poc: 0 ng/mL (ref 0.00–0.08)

## 2018-04-13 LAB — BASIC METABOLIC PANEL
ANION GAP: 11 (ref 5–15)
BUN: 5 mg/dL — ABNORMAL LOW (ref 6–20)
CALCIUM: 9.3 mg/dL (ref 8.9–10.3)
CO2: 24 mmol/L (ref 22–32)
Chloride: 99 mmol/L (ref 98–111)
Creatinine, Ser: 0.78 mg/dL (ref 0.61–1.24)
GFR calc Af Amer: >60 mL/min (ref >60)
GFR calc non Af Amer: >60 mL/min (ref >60)
Glucose, Bld: 294 mg/dL — ABNORMAL HIGH (ref 70–99)
Potassium: 3.2 mmol/L — ABNORMAL LOW (ref 3.5–5.1)
SODIUM: 134 mmol/L — AB (ref 135–145)

## 2018-04-13 LAB — LIPASE, BLOOD: Lipase: 39 U/L (ref 11–51)

## 2018-04-13 MED ORDER — PANTOPRAZOLE SODIUM 20 MG PO TBEC: 20.0000 mg | DELAYED_RELEASE_TABLET | Freq: Every day | ORAL | 0 refills | Status: DC

## 2018-04-13 MED ORDER — ASPIRIN 81 MG PO CHEW
324.0000 mg | CHEWABLE_TABLET | Freq: Once | ORAL | Status: AC
  Administered 2018-04-13: 324 mg via ORAL
  Filled 2018-04-13: qty 4

## 2018-04-13 MED ORDER — IOPAMIDOL (ISOVUE-370) INJECTION 76%
100.0000 mL | Freq: Once | INTRAVENOUS | Status: AC | PRN
  Administered 2018-04-13: 100 mL via INTRAVENOUS

## 2018-04-13 MED ORDER — NITROGLYCERIN 0.4 MG SL SUBL
0.4000 mg | SUBLINGUAL_TABLET | SUBLINGUAL | Status: AC | PRN
  Administered 2018-04-13 (×3): 0.4 mg via SUBLINGUAL
  Filled 2018-04-13: qty 1

## 2018-04-13 MED ORDER — OXYCODONE-ACETAMINOPHEN 5-325 MG PO TABS
2.0000 | ORAL_TABLET | Freq: Once | ORAL | Status: AC
  Administered 2018-04-13: 2 via ORAL
  Filled 2018-04-13: qty 2

## 2018-04-13 MED ORDER — GI COCKTAIL ~~LOC~~
30.0000 mL | Freq: Once | ORAL | Status: AC
  Administered 2018-04-13: 30 mL via ORAL
  Filled 2018-04-13: qty 30

## 2018-04-13 MED ORDER — IOPAMIDOL (ISOVUE-370) INJECTION 76%
INTRAVENOUS | Status: AC
  Filled 2018-04-13: qty 100

## 2018-04-13 NOTE — ED Triage Notes (Signed)
Pt reports central chest pain that began last night, states he thought it was heart burn and took some medication for that which did not provide any relief. Denies sob. Endorses some radiation to his back.

## 2018-04-13 NOTE — ED Provider Notes (Signed)
[name] EMERGENCY DEPARTMENT Provider Note   CSN: [id number] Arrival date & time: 04/13/18  0757     History   Chief Complaint Chief Complaint  Patient presents with  . Chest Pain    HPI Logan Weiss is a 47 y.o. male.  HPI 47 year old male presents today complaining of 12-hour history of chest pain.  States that it began last night after eating some greasy food.  It is in the mid chest area and feels heavy at a 7 out of 10.  It is nonradiating.  Upon questioning he feels that he has some dyspnea but had not noted this is a main symptom.  He took Pepto-Bismol for what he thought were GI symptoms without relief.  He has not had similar symptoms in the past.  He did take a walk last after it started and did not increase.  He has risk factors of type 2 diabetes and his mother had an MI at approximately age 51.  He denies any DVT symptoms, or risk factors Past Medical History:  Diagnosis Date  . Cellulitis 11/2017   of face   . Diabetes (HCC)    new dx  . History of MRSA infection     Patient Active Problem List   Diagnosis Date Noted  . Cellulitis, face 11/05/2017  . Type 2 diabetes mellitus with hyperglycemia (HCC) 11/05/2017  . Elevated blood pressure reading 11/05/2017  . Cellulitis 11/05/2017  . 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 History of MRSA infection 06/08/2014    Past Surgical History:  Procedure Laterality Date  . APPENDECTOMY    . KNEE SURGERY    . right knee surgery          Home Medications    Prior to Admission medications   Medication Sig Start Date End Date Taking? Authorizing Provider  cyclobenzaprine (FLEXERIL) 10 MG tablet Take 1 tablet (10 mg total) by mouth 2 (two) times daily as needed for muscle spasms. 03/21/17   Bethel Born, PA-C  glipiZIDE (GLUCOTROL XL) 10 MG 24 hr tablet Take 10 mg by mouth daily with breakfast.    [provider]  GlucoCom Lancets MISC Check  blood sugar TID & QHS 06/22/14   Advani, Ayesha Rumpf, MD  glucose blood (CHOICE DM FORA G20 TEST STRIPS) test strip Use as instructed 06/22/14   Doris Cheadle, MD    Family History Family History  Problem Relation Age of Onset  . Hyperlipidemia Mother   . Mental retardation Mother   . Diabetes Mother   . Diabetes Father   . Hyperlipidemia Father   . Hypertension Father   . Diabetes Maternal Grandmother   . Diabetes Maternal Grandfather   . Diabetes Paternal Grandmother   . Diabetes Paternal Grandfather     Social History Social History   Tobacco Use  . Smoking status: Current Every Day Smoker    Packs/day: 0.00    Years: 5.00    Pack years: 0.00    Types: Cigarettes  . Smokeless tobacco: Never Used  Substance Use Topics  . Alcohol use: No    Alcohol/week: 0.0 standard drinks    Comment: socially  . Drug use: No     Allergies   Doxycycline   Review of Systems Review of Systems  All other systems reviewed and are negative.    Physical Exam Updated Vital Signs BP (!) 136/93   Pulse 86   Temp 98.5 F (36.9 C) (Oral)  Resp 15   SpO2 96%   Physical Exam  Constitutional: He is oriented to person, place, and time. He appears well-developed and well-nourished. He does not appear ill.  HENT:  Head: Normocephalic.  Eyes: Pupils are equal, round, and reactive to light.  Neck: Normal range of motion.  Cardiovascular: Normal rate, regular rhythm, intact distal pulses and normal pulses. Exam reveals no S3 and no S4.    No diastolic murmur is present. Pulmonary/Chest: Effort normal and breath sounds normal.  Abdominal: Soft. Bowel sounds are normal.  Mild epigastric ttp  Musculoskeletal: Normal range of motion.       Right lower leg: Normal. He exhibits no tenderness and no edema.       Left lower leg: Normal. He exhibits no tenderness and no edema.  Neurological: He is alert and oriented to person, place, and time.  Skin: Skin is warm and dry. Capillary refill  takes less than 2 seconds.  Psychiatric: He has a normal mood and affect.  Nursing note and vitals reviewed.    ED Treatments / Results  Labs (all labs ordered are listed, but only abnormal results are displayed) Labs Reviewed  BASIC METABOLIC PANEL  CBC  I-STAT TROPONIN, ED    EKG EKG Interpretation  Date/Time:  Sunday April 13 2018 08:00:04 EDT Ventricular Rate:  75 PR Interval:  144 QRS Duration: 82 QT Interval:  350 QTC Calculation: 390 R Axis:   12 Text Interpretation:  Normal sinus rhythm Non-specific ST-t changes v3-v5 Confirmed by Margarita Grizzle [phone]) on 04/13/2018 8:26:38 AM   Radiology Dg Chest 2 View  Result Date: 04/13/2018 CLINICAL DATA:  Chest pain EXAM: CHEST - 2 VIEW COMPARISON:  11/04/2017 chest radiograph. FINDINGS: Stable cardiomediastinal silhouette with normal heart size. No pneumothorax. No pleural effusion. Lungs appear clear, with no acute consolidative airspace disease and no pulmonary edema. IMPRESSION: No active cardiopulmonary disease. Electronically Signed   By: Delbert Phenix M.D.   On: 04/13/2018 08:55   Ct Angio Chest Pe W And/or Wo Contrast  Result Date: 04/13/2018 CLINICAL DATA:  Central chest pain beginning last night radiating to back, intermediate clinical probability for pulmonary embolism, history type II diabetes mellitus, smoker EXAM: CT ANGIOGRAPHY CHEST WITH CONTRAST TECHNIQUE: Multidetector CT imaging of the chest was performed using the standard protocol during bolus administration of intravenous contrast. Multiplanar CT image reconstructions and MIPs were obtained to evaluate the vascular anatomy. CONTRAST:  ISOVUE-370 IOPAMIDOL (ISOVUE-370) INJECTION 76% COMPARISON:  None FINDINGS: Cardiovascular: Aorta normal caliber. Heart unremarkable. No pericardial effusion. Pulmonary arteries well opacified and patent. No evidence of pulmonary embolism. Mediastinum/Nodes: Esophagus unremarkable. Base of cervical region normal appearance. No  thoracic adenopathy. Lungs/Pleura: Minimal dependent atelectasis in the posterior lungs. Subsegmental atelectasis RIGHT middle lobe and base of lingula. No infiltrate, pleural effusion, pneumothorax or mass. Upper Abdomen: Visualized upper abdomen unremarkable Musculoskeletal: No acute osseous findings. Review of the MIP images confirms the above findings. IMPRESSION: No evidence of pulmonary embolism. Minimal scattered atelectasis. Electronically Signed   By: Ulyses Southward M.D.   On: 04/13/2018 13:50    Procedures Procedures (including critical care time)  Medications Ordered in ED Medications  aspirin chewable tablet 324 mg (has no administration in time range)  nitroGLYCERIN (NITROSTAT) SL tablet 0.4 mg (has no administration in time range)     Initial Impression / Assessment and Plan / ED Course  I have reviewed the triage vital signs and the nursing notes.  Pertinent labs & imaging results that were available  during my care of the patient were reviewed by me and considered in my medical decision making (see chart for details).    12:33 PM With no relief from aspirin or nitro.  Reviewed all labs and EKG.  Recent with hear score of 5, however, patient has had ongoing pain now for 16 hours.  He has no EKG changes and I am repeating troponin.  Also plan CT angios chest to assure that there is no dissection or pulmonary embolism.  Will give GI cocktail.  Plan consider discharge for further work-up of all above remain normal 2:08 PM CT angios chest without any evidence of acute abnormality.  Patient with some pain relief after GI cocktail and Percocet.  No evidence of ischemia with repeat troponin and EKG normal.  Reexamined abdomen is soft with mild epigastric tenderness.  This is likely a GI etiology.  Will start Protonix and refer to GI for follow-up. Differential diagnosis includes acute coronary syndrome, myocardial infarction, pulmonary embolism, aneurysmal dissection, Boerhaave syndrome,  other intra-pulmonary disease such as infection.  Also consider esophagitis, gastritis, pericolic.  Patient with normal lipase and bilirubin and liver function test here.  Is mildly tender the epigastric and right upper quadrant majority of pain occurring in the chest area.  Think he is stable for discharge.  He will be started on Protonix he is advised regarding return precautions include worsening symptoms, additional symptoms such as shortness of breath or fever, and the need to be rechecked in the next 24 hours.  He is given a referral to gastroenterology as a started with fatty food intake.  However, he has been tolerating food without difficulty. Final Clinical Impressions(s) / ED Diagnoses   Final diagnoses:  Chest pain, unspecified type    ED Discharge Orders    None       Margarita Grizzle, MD 04/13/18 1431

## 2018-04-13 NOTE — Discharge Instructions (Addendum)
Your EKG, heart enzymes, and CT of your chest appeared normal today. Return immediately if you have worsening pain, change in the type of pain, or additional symptoms such as shortness of breath or fever Recheck with your doctor tomorrow and call for follow-up with gastroenterology as referred.

## 2018-04-13 NOTE — ED Notes (Signed)
Pt stable, ambulatory, states understanding of discharge instructions 

## 2018-04-13 NOTE — ED Notes (Signed)
Triage sent phlebotomy back to [facility] stating they would draw pts blood.

## 2018-04-13 NOTE — ED Notes (Signed)
Pt waiting for blood draw in triage 1

## 2018-05-05 ENCOUNTER — Emergency Department (HOSPITAL_COMMUNITY)
Admission: EM | Admit: 2018-05-05 | Discharge: 2018-05-05 | Disposition: A | Discharge status: Home / Self Care | Payer: BLUE CROSS/BLUE SHIELD | Attending: Emergency Medicine | Admitting: Emergency Medicine

## 2018-05-05 ENCOUNTER — Encounter (HOSPITAL_COMMUNITY): Payer: Self-pay | Admitting: *Deleted

## 2018-05-05 DIAGNOSIS — E1165 Type 2 diabetes mellitus with hyperglycemia: Secondary | ICD-10-CM | POA: Insufficient documentation

## 2018-05-05 DIAGNOSIS — Z79899 Other long term (current) drug therapy: Secondary | ICD-10-CM | POA: Diagnosis not present

## 2018-05-05 DIAGNOSIS — R202 Paresthesia of skin: Secondary | ICD-10-CM | POA: Insufficient documentation

## 2018-05-05 DIAGNOSIS — F17210 Nicotine dependence, cigarettes, uncomplicated: Secondary | ICD-10-CM | POA: Insufficient documentation

## 2018-05-05 LAB — CBC WITH DIFFERENTIAL/PLATELET
ABS IMMATURE GRANULOCYTES: 0.04 10*3/uL (ref 0.00–0.07)
Basophils Absolute: 0 10*3/uL (ref 0.0–0.1)
Basophils Relative: 1 %
EOS PCT: 3 %
Eosinophils Absolute: 0.2 10*3/uL (ref 0.0–0.5)
HCT: 48.2 % (ref 39.0–52.0)
Hemoglobin: 16.1 g/dL (ref 13.0–17.0)
Immature Granulocytes: 1 %
LYMPHS PCT: 44 %
Lymphs Abs: 2.6 10*3/uL (ref 0.7–4.0)
MCH: 31.3 pg (ref 26.0–34.0)
MCHC: 33.4 g/dL (ref 30.0–36.0)
MCV: 93.6 fL (ref 80.0–100.0)
MONO ABS: 0.4 10*3/uL (ref 0.1–1.0)
MONOS PCT: 6 %
NEUTROS ABS: 2.8 10*3/uL (ref 1.7–7.7)
Neutrophils Relative %: 45 %
Platelets: 180 10*3/uL (ref 150–400)
RBC: 5.15 MIL/uL (ref 4.22–5.81)
RDW: 12.3 % (ref 11.5–15.5)
WBC: 6 10*3/uL (ref 4.0–10.5)
nRBC: 0 % (ref 0.0–0.2)

## 2018-05-05 LAB — COMPREHENSIVE METABOLIC PANEL
ALT: 18 U/L (ref 0–44)
AST: 24 U/L (ref 15–41)
Albumin: 3.9 g/dL (ref 3.5–5.0)
Alkaline Phosphatase: 88 U/L (ref 38–126)
Anion gap: 11 (ref 5–15)
BILIRUBIN TOTAL: 0.7 mg/dL (ref 0.3–1.2)
BUN: <5 mg/dL — ABNORMAL LOW (ref 6–20)
CALCIUM: 9.4 mg/dL (ref 8.9–10.3)
CO2: 24 mmol/L (ref 22–32)
CREATININE: 0.75 mg/dL (ref 0.61–1.24)
Chloride: 102 mmol/L (ref 98–111)
Glucose, Bld: 268 mg/dL — ABNORMAL HIGH (ref 70–99)
Potassium: 3.7 mmol/L (ref 3.5–5.1)
Sodium: 137 mmol/L (ref 135–145)
TOTAL PROTEIN: 7.6 g/dL (ref 6.5–8.1)

## 2018-05-05 NOTE — ED Provider Notes (Signed)
[name] EMERGENCY DEPARTMENT Provider Note   CSN: [id number] Arrival date & time: 05/05/18  1314     History   Chief Complaint Chief Complaint  Patient presents with  . Numbness    HPI Logan Weiss is a 47 y.o. male.  HPI   Presents with 5 days of numbness, tingling in bilateral arms and legs, sometimes feels like shock like sensation into arms and legs.  It comes and goes lasting minutes. Hx of DM. Symptoms present just in distal arms and below the knees.  Denies weakness, facial droop, headache, neck pain, change in vision, difficulty walking.  Denies trauma, fevers, chest pain, shortness of breath.  Reports at times it is a shocklike sensation in his distal arms and bilateral legs.  Reports no change in his glucose.  Denies family history of MS.  Past Medical History:  Diagnosis Date  . Cellulitis 11/2017   of face   . Diabetes (HCC)    new dx  . History of MRSA infection     Patient Active Problem List   Diagnosis Date Noted  . Cellulitis, face 11/05/2017  . Type 2 diabetes mellitus with hyperglycemia (HCC) 11/05/2017  . Elevated blood pressure reading 11/05/2017  . Cellulitis 11/05/2017  . 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 History of MRSA infection 06/08/2014    Past Surgical History:  Procedure Laterality Date  . APPENDECTOMY    . KNEE SURGERY    . right knee surgery          Home Medications    Prior to Admission medications   Medication Sig Start Date End Date Taking? Authorizing Provider  BLACK CURRANT SEED OIL PO Take 15 mLs by mouth daily.    [provider]  cyclobenzaprine (FLEXERIL) 10 MG tablet Take 1 tablet (10 mg total) by mouth 2 (two) times daily as needed for muscle spasms. Patient not taking: Reported on 04/13/2018 03/21/17   Bethel Born, PA-C  glipiZIDE (GLUCOTROL XL) 10 MG 24 hr tablet Take 10 mg by mouth daily with breakfast.    [provider]    GlucoCom Lancets MISC Check blood sugar TID & QHS 06/22/14   Advani, Ayesha Rumpf, MD  glucose blood (CHOICE DM FORA G20 TEST STRIPS) test strip Use as instructed 06/22/14   Doris Cheadle, MD  OVER THE COUNTER MEDICATION Bitter Gourd tea    [provider]  pantoprazole (PROTONIX) 20 MG tablet Take 1 tablet (20 mg total) by mouth daily. 04/13/18   Margarita Grizzle, MD    Family History Family History  Problem Relation Age of Onset  . Hyperlipidemia Mother   . Mental retardation Mother   . Diabetes Mother   . Diabetes Father   . Hyperlipidemia Father   . Hypertension Father   . Diabetes Maternal Grandmother   . Diabetes Maternal Grandfather   . Diabetes Paternal Grandmother   . Diabetes Paternal Grandfather     Social History Social History   Tobacco Use  . Smoking status: Current Every Day Smoker    Packs/day: 0.00    Years: 5.00    Pack years: 0.00    Types: Cigarettes  . Smokeless tobacco: Never Used  Substance Use Topics  . Alcohol use: No    Alcohol/week: 0.0 standard drinks    Comment: socially  . Drug use: No     Allergies   Doxycycline   Review of Systems Review of Systems  Constitutional: Negative for fever.  HENT: Negative for sore throat.   Eyes: Negative for visual disturbance.  Respiratory: Negative for shortness of breath.   Cardiovascular: Negative for chest pain.  Gastrointestinal: Negative for abdominal pain.  Genitourinary: Negative for difficulty urinating.  Musculoskeletal: Negative for back pain and neck stiffness.  Skin: Negative for rash.  Neurological: Positive for numbness (tingling). Negative for syncope and headaches.     Physical Exam Updated Vital Signs BP (!) 121/91   Pulse 72   Temp 98 F (36.7 C) (Oral)   Resp 15   SpO2 100%   Physical Exam  Constitutional: He is oriented to person, place, and time. He appears well-developed and well-nourished. No distress.  HENT:  Head: Normocephalic and atraumatic.  Eyes:  Conjunctivae and EOM are normal.  Neck: Normal range of motion.  Cardiovascular: Normal rate, regular rhythm, normal heart sounds and intact distal pulses. Exam reveals no gallop and no friction rub.  No murmur heard. Pulmonary/Chest: Effort normal and breath sounds normal. No respiratory distress. He has no wheezes. He has no rales.  Abdominal: Soft. He exhibits no distension. There is no tenderness. There is no guarding.  Musculoskeletal: He exhibits no edema.  Neurological: He is alert and oriented to person, place, and time. He has normal strength. A sensory deficit (intermittently with tingling of bilateral hands during exam) is present. No cranial nerve deficit. Coordination normal. GCS eye subscore is 4. GCS verbal subscore is 5. GCS motor subscore is 6.  Skin: Skin is warm and dry. He is not diaphoretic.  Nursing note and vitals reviewed.    ED Treatments / Results  Labs (all labs ordered are listed, but only abnormal results are displayed) Labs Reviewed  COMPREHENSIVE METABOLIC PANEL - Abnormal; Notable for the following components:      Result Value   Glucose, Bld 268 (*)    BUN <5 (*)    All other components within normal limits  CBC WITH DIFFERENTIAL/PLATELET    EKG None  Radiology No results found.  Procedures Procedures (including critical care time)  Medications Ordered in ED Medications - No data to display   Initial Impression / Assessment and Plan / ED Course  I have reviewed the triage vital signs and the nursing notes.  Pertinent labs & imaging results that were available during my care of the patient were reviewed by me and considered in my medical decision making (see chart for details).    47 year old male with history of diabetes presents with concern for bilateral upper and lower extremity paresthesias which have been intermittent.  Patient with strong pulses in upper and lower extremities, have low suspicion for acute arterial occlusion or  dissection.  No history of trauma, no neck pain, doubt cervical spine fracture etiology.  History and exam are not consistent with intracranial etiology of symptoms, including low suspicion for CVA.  Electrolytes are within normal limits, with exception of mild hyperglycemia.  Unclear etiology of symptoms at this time, possible neuropathy, recommend Neurology and PCP follow up, rule out MS or other cervical disease.     Final Clinical Impressions(s) / ED Diagnoses   Final diagnoses:  Paresthesia    ED Discharge Orders    None       Alvira Monday, MD 05/06/18 [phone]

## 2018-05-05 NOTE — ED Triage Notes (Addendum)
Pt in c/o numbness to his bilateral arms and legs, normal movement and strength noted to all extremities, denies pain or headaches, states this started 5 days ago, describes it as feeling like his arms are asleep at times

## 2018-05-05 NOTE — ED Notes (Signed)
Pt reports that his numbness is all over his body and the symptoms began approximately 5 days ago.

## 2018-05-05 NOTE — ED Notes (Signed)
E-signature not available, pt verbalized understanding of DC instructions and follow up care.  

## 2018-05-05 NOTE — ED Notes (Signed)
Pt complains of "I'm number all over" x 5 days. Pt states that he was here before that for "heart problems" and d/c because "they said I didn't have a stroke or a heart attack" VSS, NAD. Neuro exam unremarkable.

## 2018-05-05 NOTE — ED Provider Notes (Signed)
Patient placed in Quick Look pathway, seen and evaluated   Chief Complaint: Decreased sensation  HPI:   Patient reports intermittent tingling in bilateral arms and legs.  He reports this has been going on for 5 days.  He denies weakness.  No headache.  He checked his sugar this morning and it was 190 which is normal for him.  He takes multiple natural supplements for his diabetes.   ROS: No fevers (one)  Physical Exam:   Gen: No distress  Neuro: Awake and Alert  Skin: Warm    Focused Exam: Normal gait.  Moves all extremities spontaneously.    Initiation of care has begun. The patient has been counseled on the process, plan, and necessity for staying for the completion/evaluation, and the remainder of the medical screening examination    Norman Clay 05/05/18 1321    Melene Plan, DO 05/05/18 1530

## 2019-09-02 ENCOUNTER — Ambulatory Visit (INDEPENDENT_AMBULATORY_CARE_PROVIDER_SITE_OTHER): Payer: 59 | Admitting: Ophthalmology

## 2019-09-02 ENCOUNTER — Encounter (INDEPENDENT_AMBULATORY_CARE_PROVIDER_SITE_OTHER): Payer: Self-pay | Admitting: Ophthalmology

## 2019-09-02 ENCOUNTER — Other Ambulatory Visit: Payer: Self-pay

## 2019-09-02 DIAGNOSIS — H269 Unspecified cataract: Secondary | ICD-10-CM

## 2019-09-02 DIAGNOSIS — H3581 Retinal edema: Secondary | ICD-10-CM | POA: Diagnosis not present

## 2019-09-02 DIAGNOSIS — H35411 Lattice degeneration of retina, right eye: Secondary | ICD-10-CM

## 2019-09-02 DIAGNOSIS — H5213 Myopia, bilateral: Secondary | ICD-10-CM

## 2019-09-02 DIAGNOSIS — H52203 Unspecified astigmatism, bilateral: Secondary | ICD-10-CM

## 2019-09-02 DIAGNOSIS — E119 Type 2 diabetes mellitus without complications: Secondary | ICD-10-CM

## 2019-09-02 NOTE — Progress Notes (Addendum)
[facility] - Clinic Note  09/02/2019     CHIEF COMPLAINT Patient presents for Retina Evaluation   HISTORY OF PRESENT ILLNESS: Logan Weiss is a 49 y.o. male who presents to the clinic today for:   HPI    Retina Evaluation    In right eye.  Onset: unknown.  Duration: unknown.  Associated Symptoms Negative for Flashes, Distortion, Pain, Photophobia, Trauma, Jaw Claudication, Fever, Fatigue, Floaters, Blind Spot, Redness, Glare, Scalp Tenderness, Shoulder/Hip pain and Weight Loss.  Context:  night driving.  Treatments tried include no treatments.  I, the attending physician,  performed the HPI with the patient and updated documentation appropriately.          Comments    Patient was referred by Dr. Hanley Seamen for possible retinal hole OD. Patient denies floaters and flashes. Was found on exam today with Dr. Hanley Seamen. Patient denies any previous history of breaks or tears. BS was 243 this am. Last a1c was 14.0, checked about one month ago.        Last edited by Rennis Chris, MD on 09/04/2019  1:14 PM. (History)    pt states he was in Dr. Eugene Garnet office this morning for a DM exam, pt was referred there by his PCP bc his blood sugars have been high, he states Bernstorf said there is no trace of diabetes in his eyes, but he did see holes in both of them  Referring physician: Francene Boyers, MD [address] [place],  [place] [address]  HISTORICAL INFORMATION:   Selected notes from the MEDICAL RECORD NUMBER    CURRENT MEDICATIONS: No current outpatient medications on file. (Ophthalmic Drugs)   No current facility-administered medications for this visit. (Ophthalmic Drugs)   Current Outpatient Medications (Other)  Medication Sig  . acetaminophen (TYLENOL) 500 MG tablet Take 500 mg by mouth as directed.  . Ascorbic Acid (VITAMIN C) 100 MG tablet Take 1 tablet by mouth daily.  Marland Kitchen BLACK CURRANT SEED OIL PO Take 15 mLs by mouth daily.  . Cholecalciferol  125 MCG (5000 UT) capsule Take 1 tablet by mouth daily.  Marland Kitchen glimepiride (AMARYL) 4 MG tablet Take 4 mg by mouth daily.  . GlucoCom Lancets MISC Check blood sugar TID & QHS  . glucose blood (CHOICE DM FORA G20 TEST STRIPS) test strip Use as instructed  . OVER THE COUNTER MEDICATION Bitter Gourd tea  . pantoprazole (PROTONIX) 20 MG tablet Take 1 tablet (20 mg total) by mouth daily.  . pioglitazone (ACTOS) 15 MG tablet Take 15 mg by mouth daily.  . vitamin E 200 UNIT capsule Take 200 Units by mouth daily.  . cyclobenzaprine (FLEXERIL) 10 MG tablet Take 1 tablet (10 mg total) by mouth 2 (two) times daily as needed for muscle spasms. (Patient not taking: Reported on 04/13/2018)  . glipiZIDE (GLUCOTROL XL) 10 MG 24 hr tablet Take 10 mg by mouth daily with breakfast.   No current facility-administered medications for this visit. (Other)      REVIEW OF SYSTEMS: ROS    Positive for: Endocrine, Eyes   Negative for: Constitutional, Gastrointestinal, Neurological, Skin, Genitourinary, Musculoskeletal, HENT, Cardiovascular, Respiratory, Psychiatric, Allergic/Imm, Heme/Lymph   Last edited by Annalee Genta D, COT on 09/02/2019  1:12 PM. (History)       ALLERGIES Allergies  Allergen Reactions  . Doxycycline Hives    PAST MEDICAL HISTORY Past Medical History:  Diagnosis Date  . Cellulitis 11/2017   of face   . Diabetes (HCC)  new dx  . History of MRSA infection    Past Surgical History:  Procedure Laterality Date  . APPENDECTOMY    . KNEE SURGERY    . right knee surgery      FAMILY HISTORY Family History  Problem Relation Age of Onset  . Hyperlipidemia Mother   . Mental retardation Mother   . Diabetes Mother   . Diabetes Father   . Hyperlipidemia Father   . Hypertension Father   . Diabetes Maternal Grandmother   . Diabetes Maternal Grandfather   . Diabetes Paternal Grandmother   . Diabetes Paternal Grandfather     SOCIAL HISTORY Social History   Tobacco Use  . Smoking  status: Current Every Day Smoker    Packs/day: 0.00    Years: 5.00    Pack years: 0.00    Types: Cigarettes  . Smokeless tobacco: Never Used  Substance Use Topics  . Alcohol use: No    Alcohol/week: 0.0 standard drinks    Comment: socially  . Drug use: No         OPHTHALMIC EXAM:  Base Eye Exam    Visual Acuity (Snellen - Linear)      Right Left   Dist cc 20/25 -2 20/25 -2   Dist ph cc NI 20/25 -1   Correction: Glasses       Tonometry (Tonopen, 1:23 PM)      Right Left   Pressure 14 19       Pupils      Dark Light Shape React APD   Right 9 9 Round None None   Left 9 9 Round None None       Visual Fields (Counting fingers)      Left Right    Full Full       Extraocular Movement      Right Left    Full, Ortho Full, Ortho       Dilation    Both eyes: 1.0% Mydriacyl, 2.5% Phenylephrine @ 1:23 PM        Slit Lamp and Fundus Exam    Slit Lamp Exam      Right Left   Lids/Lashes Mild Meibomian gland dysfunction Mild Meibomian gland dysfunction   Conjunctiva/Sclera Mild Melanosis Mild Melanosis   Cornea mild Arcus, trace Punctate epithelial erosions mild Arcus, trace Punctate epithelial erosions   Anterior Chamber Deep and quiet Deep and quiet   Iris Round and dilated Round and dilated   Lens 1+ Cortical cataract 1+ Cortical cataract   Vitreous Mild Vitreous syneresis Mild Vitreous syneresis       Fundus Exam      Right Left   Disc Pink and Sharp Pink and Sharp, +cupping   C/D Ratio 0.65 0.65   Macula Flat, Good foveal reflex, No heme or edema Flat, Good foveal reflex, mild Retinal pigment epithelial mottling, No heme or edema   Vessels mild Vascular attenuation mild Vascular attenuation, mild AV crossing changes   Periphery Attached, temporal WWP, focal pigmented lattice at 1030, no RT or SRF Attached, pigmented cystoid degeneration temporally with focal atrophic spot at 1030, no hole        Refraction    Wearing Rx      Sphere Cylinder Axis    Right -3.50 +2.25 096   Left -3.50 +1.75 100       Manifest Refraction      Sphere Cylinder Axis Dist VA   Right -3.25 +1.75 100 20/25-1   Left -3.25 +1.25 105  20/25          IMAGING AND PROCEDURES  Imaging and Procedures for @TODAY @  OCT, Retina - OU - Both Eyes       Right Eye Quality was good. Central Foveal Thickness: 261. Progression has no prior data. Findings include vitreomacular adhesion , normal foveal contour, no IRF, no SRF.   Left Eye Quality was good. Central Foveal Thickness: 265. Progression has no prior data. Findings include normal foveal contour, no IRF, no SRF.   Notes *Images captured and stored on drive  Diagnosis / Impression:  NFP, no IRF/SRF OU No DME OU  Clinical management:  See below  Abbreviations: NFP - Normal foveal profile. CME - cystoid macular edema. PED - pigment epithelial detachment. IRF - intraretinal fluid. SRF - subretinal fluid. EZ - ellipsoid zone. ERM - epiretinal membrane. ORA - outer retinal atrophy. ORT - outer retinal tubulation. 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 Diabetes mellitus type 2 without retinopathy (HCC)  E11.9   3. Retinal edema  H35.81 OCT, Retina - OU - Both Eyes  4. Cortical cataract of both eyes  H26.9   5. Myopia of both eyes with astigmatism  H52.13    H52.203     1. Lattice degeneration, OD  - OD: focal, pigmented lattice at 1030  - no retinal hole, tear or detachment  - discussed findings, prognosis, and treatment options including observation  - f/u in 3 wks, sooner prn -- DFE/OCT  2. Diabetes mellitus, type 2 without retinopathy  - The incidence, risk factors for progression, natural history and treatment options for diabetic retinopathy  were discussed with patient.    - The need for close monitoring of blood glucose, blood pressure, and serum lipids, avoiding cigarette or  any type of tobacco, and the need for long term follow up was also discussed with patient.  - f/u in 1 year, sooner prn  3. No retinal edema on exam or OCT  4. Cortical Cataract OU -- mild  - The symptoms of cataract, surgical options, and treatments and risks were discussed with patient.  - discussed diagnosis and progression  - not yet visually significant  - monitor for now  5. Myopia w/ astigmatism OU  Ophthalmic Meds Ordered this visit:  No orders of the defined types were placed in this encounter.      Return in about 3 weeks (around 09/23/2019) for f/u lattice degen OD, DFE, OCT.  There are no Patient Instructions on file for this visit.   Explained the diagnoses, plan, and follow up with the patient and they expressed understanding.  Patient expressed understanding of the importance of proper follow up care.   This document serves as a record of services personally performed by 09/25/2019, MD, PhD. It was created on their behalf by Karie Chimera, OA, an ophthalmic assistant. The creation of this record is the provider's dictation and/or activities during the visit.    Electronically signed by: Laurian Brim, OA 02.24.2021 1:24 PM   02.26.2021, M.D., Ph.D. Diseases & Surgery of the Retina and Vitreous [facility]  I have reviewed the above documentation for accuracy and completeness, and I agree with the above. [facility], M.D., Ph.D. 09/04/19 1:24 PM   Abbreviations: M myopia (nearsighted); A  astigmatism; H hyperopia (farsighted); P presbyopia; Mrx spectacle prescription;  CTL contact lenses; OD right eye; OS left eye; OU both eyes  XT exotropia; ET esotropia; PEK punctate epithelial keratitis; PEE punctate epithelial erosions; DES dry eye syndrome; MGD meibomian gland dysfunction; ATs artificial tears; PFAT's preservative free artificial tears; NSC nuclear sclerotic cataract; PSC posterior subcapsular cataract; ERM epi-retinal  membrane; PVD posterior vitreous detachment; RD retinal detachment; DM diabetes mellitus; DR diabetic retinopathy; NPDR non-proliferative diabetic retinopathy; PDR proliferative diabetic retinopathy; CSME clinically significant macular edema; DME diabetic macular edema; dbh dot blot hemorrhages; CWS cotton wool spot; POAG primary open angle glaucoma; C/D cup-to-disc ratio; HVF humphrey visual field; GVF goldmann visual field; OCT optical coherence tomography; IOP intraocular pressure; BRVO Branch retinal vein occlusion; CRVO central retinal vein occlusion; CRAO central retinal artery occlusion; BRAO branch retinal artery occlusion; RT retinal tear; SB scleral buckle; PPV pars plana vitrectomy; VH Vitreous hemorrhage; PRP panretinal laser photocoagulation; IVK intravitreal kenalog; VMT vitreomacular traction; MH Macular hole;  NVD neovascularization of the disc; NVE neovascularization elsewhere; AREDS age related eye disease study; ARMD age related macular degeneration; POAG primary open angle glaucoma; EBMD epithelial/anterior basement membrane dystrophy; ACIOL anterior chamber intraocular lens; IOL intraocular lens; PCIOL posterior chamber intraocular lens; Phaco/IOL phacoemulsification with intraocular lens placement; PRK photorefractive keratectomy; LASIK laser assisted in situ keratomileusis; HTN hypertension; DM diabetes mellitus; COPD chronic obstructive pulmonary disease

## 2019-09-04 ENCOUNTER — Encounter (INDEPENDENT_AMBULATORY_CARE_PROVIDER_SITE_OTHER): Payer: Self-pay | Admitting: Ophthalmology

## 2019-09-21 NOTE — Progress Notes (Shared)
?  [facility] - Clinic Note ? ?09/23/2019 ? ?  ? ?CHIEF COMPLAINT ?Patient presents for No chief complaint on file. ? ? ?HISTORY OF PRESENT ILLNESS: ?Logan Weiss is a 49 y.o. male who presents to the clinic today for:  ? ?pt states he was in Dr. Eugene Garnet office this morning for a DM exam, pt was referred there by his PCP bc his blood sugars have been high, he states Bernstorf said there is no trace of diabetes in his eyes, but he did see holes in both of them ? ?Referring physician: ?Malka So., MD ?No address on file ? ?HISTORICAL INFORMATION:  ? ?Selected notes from the MEDICAL RECORD NUMBER ?  ? ?CURRENT MEDICATIONS: ?No current outpatient medications on file. (Ophthalmic Drugs)  ? ?No current facility-administered medications for this visit. (Ophthalmic Drugs)  ? ?Current Outpatient Medications (Other)  ?Medication Sig  ?? acetaminophen (TYLENOL) 500 MG tablet Take 500 mg by mouth as directed.  ?? Ascorbic Acid (VITAMIN C) 100 MG tablet Take 1 tablet by mouth daily.  ?? BLACK CURRANT SEED OIL PO Take 15 mLs by mouth daily.  ?? Cholecalciferol 125 MCG (5000 UT) capsule Take 1 tablet by mouth daily.  ?? cyclobenzaprine (FLEXERIL) 10 MG tablet Take 1 tablet (10 mg total) by mouth 2 (two) times daily as needed for muscle spasms. (Patient not taking: Reported on 04/13/2018)  ?? glimepiride (AMARYL) 4 MG tablet Take 4 mg by mouth daily.  ?? glipiZIDE (GLUCOTROL XL) 10 MG 24 hr tablet Take 10 mg by mouth daily with breakfast.  ?? GlucoCom Lancets MISC Check blood sugar TID & QHS  ?? glucose blood (CHOICE DM FORA G20 TEST STRIPS) test strip Use as instructed  ?? OVER THE COUNTER MEDICATION Bitter Gourd tea  ?? pantoprazole (PROTONIX) 20 MG tablet Take 1 tablet (20 mg total) by mouth daily.  ?? pioglitazone (ACTOS) 15 MG tablet Take 15 mg by mouth daily.  ?? vitamin E 200 UNIT capsule Take 200 Units by mouth daily.  ? ?No current facility-administered medications for this visit. (Other)  ? ? ? ?  ?REVIEW OF SYSTEMS: ? ? ? ?ALLERGIES ?Allergies

## 2019-09-23 ENCOUNTER — Encounter (INDEPENDENT_AMBULATORY_CARE_PROVIDER_SITE_OTHER): Payer: 59 | Admitting: Ophthalmology

## 2019-09-27 ENCOUNTER — Other Ambulatory Visit: Payer: Self-pay

## 2019-09-27 ENCOUNTER — Inpatient Hospital Stay (HOSPITAL_COMMUNITY): Payer: 59 | Admitting: Certified Registered Nurse Anesthetist

## 2019-09-27 ENCOUNTER — Inpatient Hospital Stay (HOSPITAL_COMMUNITY)
Admission: EM | Admit: 2019-09-27 | Discharge: 2019-10-04 | DRG: 622 | Disposition: A | Discharge status: Home / Self Care | Payer: 59 | Attending: Internal Medicine | Admitting: Internal Medicine

## 2019-09-27 ENCOUNTER — Encounter (HOSPITAL_COMMUNITY): Payer: Self-pay | Admitting: Emergency Medicine

## 2019-09-27 ENCOUNTER — Emergency Department (HOSPITAL_COMMUNITY): Payer: 59

## 2019-09-27 ENCOUNTER — Encounter (HOSPITAL_COMMUNITY)
Admission: EM | Disposition: A | Discharge status: Home / Self Care | Payer: Self-pay | Source: Home / Self Care | Attending: Internal Medicine

## 2019-09-27 DIAGNOSIS — E118 Type 2 diabetes mellitus with unspecified complications: Secondary | ICD-10-CM | POA: Diagnosis not present

## 2019-09-27 DIAGNOSIS — E1152 Type 2 diabetes mellitus with diabetic peripheral angiopathy with gangrene: Secondary | ICD-10-CM | POA: Diagnosis not present

## 2019-09-27 DIAGNOSIS — Z7984 Long term (current) use of oral hypoglycemic drugs: Secondary | ICD-10-CM | POA: Diagnosis not present

## 2019-09-27 DIAGNOSIS — Z833 Family history of diabetes mellitus: Secondary | ICD-10-CM | POA: Diagnosis not present

## 2019-09-27 DIAGNOSIS — Z888 Allergy status to other drugs, medicaments and biological substances status: Secondary | ICD-10-CM | POA: Diagnosis not present

## 2019-09-27 DIAGNOSIS — L0889 Other specified local infections of the skin and subcutaneous tissue: Secondary | ICD-10-CM | POA: Diagnosis not present

## 2019-09-27 DIAGNOSIS — E1110 Type 2 diabetes mellitus with ketoacidosis without coma: Secondary | ICD-10-CM | POA: Diagnosis present

## 2019-09-27 DIAGNOSIS — M7989 Other specified soft tissue disorders: Secondary | ICD-10-CM

## 2019-09-27 DIAGNOSIS — Z881 Allergy status to other antibiotic agents status: Secondary | ICD-10-CM | POA: Diagnosis not present

## 2019-09-27 DIAGNOSIS — Z20822 Contact with and (suspected) exposure to covid-19: Secondary | ICD-10-CM | POA: Diagnosis present

## 2019-09-27 DIAGNOSIS — R739 Hyperglycemia, unspecified: Secondary | ICD-10-CM

## 2019-09-27 DIAGNOSIS — L02215 Cutaneous abscess of perineum: Secondary | ICD-10-CM | POA: Diagnosis present

## 2019-09-27 DIAGNOSIS — E1165 Type 2 diabetes mellitus with hyperglycemia: Secondary | ICD-10-CM | POA: Diagnosis present

## 2019-09-27 DIAGNOSIS — F17210 Nicotine dependence, cigarettes, uncomplicated: Secondary | ICD-10-CM | POA: Diagnosis present

## 2019-09-27 DIAGNOSIS — L089 Local infection of the skin and subcutaneous tissue, unspecified: Secondary | ICD-10-CM

## 2019-09-27 DIAGNOSIS — B951 Streptococcus, group B, as the cause of diseases classified elsewhere: Secondary | ICD-10-CM | POA: Diagnosis not present

## 2019-09-27 DIAGNOSIS — B9689 Other specified bacterial agents as the cause of diseases classified elsewhere: Secondary | ICD-10-CM | POA: Diagnosis not present

## 2019-09-27 DIAGNOSIS — B9561 Methicillin susceptible Staphylococcus aureus infection as the cause of diseases classified elsewhere: Secondary | ICD-10-CM | POA: Diagnosis not present

## 2019-09-27 DIAGNOSIS — Z79899 Other long term (current) drug therapy: Secondary | ICD-10-CM | POA: Diagnosis not present

## 2019-09-27 DIAGNOSIS — L02415 Cutaneous abscess of right lower limb: Secondary | ICD-10-CM | POA: Diagnosis present

## 2019-09-27 DIAGNOSIS — I96 Gangrene, not elsewhere classified: Secondary | ICD-10-CM | POA: Diagnosis not present

## 2019-09-27 DIAGNOSIS — Z978 Presence of other specified devices: Secondary | ICD-10-CM | POA: Diagnosis not present

## 2019-09-27 DIAGNOSIS — D649 Anemia, unspecified: Secondary | ICD-10-CM | POA: Diagnosis present

## 2019-09-27 DIAGNOSIS — L0231 Cutaneous abscess of buttock: Secondary | ICD-10-CM | POA: Diagnosis not present

## 2019-09-27 DIAGNOSIS — N151 Renal and perinephric abscess: Secondary | ICD-10-CM | POA: Diagnosis not present

## 2019-09-27 DIAGNOSIS — E11628 Type 2 diabetes mellitus with other skin complications: Secondary | ICD-10-CM | POA: Diagnosis present

## 2019-09-27 DIAGNOSIS — Z8614 Personal history of Methicillin resistant Staphylococcus aureus infection: Secondary | ICD-10-CM

## 2019-09-27 DIAGNOSIS — L0291 Cutaneous abscess, unspecified: Secondary | ICD-10-CM | POA: Diagnosis present

## 2019-09-27 DIAGNOSIS — M726 Necrotizing fasciitis: Secondary | ICD-10-CM | POA: Diagnosis present

## 2019-09-27 DIAGNOSIS — F17211 Nicotine dependence, cigarettes, in remission: Secondary | ICD-10-CM | POA: Diagnosis not present

## 2019-09-27 DIAGNOSIS — B954 Other streptococcus as the cause of diseases classified elsewhere: Secondary | ICD-10-CM | POA: Diagnosis not present

## 2019-09-27 HISTORY — PX: INCISION AND DRAINAGE PERIRECTAL ABSCESS: SHX1804

## 2019-09-27 LAB — BASIC METABOLIC PANEL
Anion gap: 15 (ref 5–15)
Anion gap: 14 (ref 5–15)
Anion gap: 10 (ref 5–15)
Anion gap: 13 (ref 5–15)
Anion gap: 12 (ref 5–15)
BUN: 10 mg/dL (ref 6–20)
BUN: 8 mg/dL (ref 6–20)
BUN: 6 mg/dL (ref 6–20)
BUN: 6 mg/dL (ref 6–20)
BUN: <5 mg/dL — ABNORMAL LOW (ref 6–20)
CO2: 18 mmol/L — ABNORMAL LOW (ref 22–32)
CO2: 20 mmol/L — ABNORMAL LOW (ref 22–32)
CO2: 22 mmol/L (ref 22–32)
CO2: 20 mmol/L — ABNORMAL LOW (ref 22–32)
CO2: 21 mmol/L — ABNORMAL LOW (ref 22–32)
Calcium: 8.6 mg/dL — ABNORMAL LOW (ref 8.9–10.3)
Calcium: 8.3 mg/dL — ABNORMAL LOW (ref 8.9–10.3)
Calcium: 8.3 mg/dL — ABNORMAL LOW (ref 8.9–10.3)
Calcium: 8.3 mg/dL — ABNORMAL LOW (ref 8.9–10.3)
Calcium: 8 mg/dL — ABNORMAL LOW (ref 8.9–10.3)
Chloride: 101 mmol/L (ref 98–111)
Chloride: 103 mmol/L (ref 98–111)
Chloride: 104 mmol/L (ref 98–111)
Chloride: 103 mmol/L (ref 98–111)
Chloride: 102 mmol/L (ref 98–111)
Creatinine, Ser: 1.14 mg/dL (ref 0.61–1.24)
Creatinine, Ser: 0.78 mg/dL (ref 0.61–1.24)
Creatinine, Ser: 0.65 mg/dL (ref 0.61–1.24)
Creatinine, Ser: 0.77 mg/dL (ref 0.61–1.24)
Creatinine, Ser: 0.81 mg/dL (ref 0.61–1.24)
GFR calc Af Amer: >60 mL/min (ref >60)
GFR calc Af Amer: >60 mL/min (ref >60)
GFR calc Af Amer: >60 mL/min (ref >60)
GFR calc Af Amer: >60 mL/min (ref >60)
GFR calc Af Amer: >60 mL/min (ref >60)
GFR calc non Af Amer: >60 mL/min (ref >60)
GFR calc non Af Amer: >60 mL/min (ref >60)
GFR calc non Af Amer: >60 mL/min (ref >60)
GFR calc non Af Amer: >60 mL/min (ref >60)
GFR calc non Af Amer: >60 mL/min (ref >60)
Glucose, Bld: 360 mg/dL — ABNORMAL HIGH (ref 70–99)
Glucose, Bld: 186 mg/dL — ABNORMAL HIGH (ref 70–99)
Glucose, Bld: 151 mg/dL — ABNORMAL HIGH (ref 70–99)
Glucose, Bld: 180 mg/dL — ABNORMAL HIGH (ref 70–99)
Glucose, Bld: 233 mg/dL — ABNORMAL HIGH (ref 70–99)
Potassium: 3.8 mmol/L (ref 3.5–5.1)
Potassium: 4.4 mmol/L (ref 3.5–5.1)
Potassium: 3.5 mmol/L (ref 3.5–5.1)
Potassium: 4.6 mmol/L (ref 3.5–5.1)
Potassium: 3.5 mmol/L (ref 3.5–5.1)
Sodium: 134 mmol/L — ABNORMAL LOW (ref 135–145)
Sodium: 137 mmol/L (ref 135–145)
Sodium: 136 mmol/L (ref 135–145)
Sodium: 136 mmol/L (ref 135–145)
Sodium: 135 mmol/L (ref 135–145)

## 2019-09-27 LAB — COMPREHENSIVE METABOLIC PANEL
ALT: 16 U/L (ref 0–44)
AST: 19 U/L (ref 15–41)
Albumin: 3.3 g/dL — ABNORMAL LOW (ref 3.5–5.0)
Alkaline Phosphatase: 96 U/L (ref 38–126)
Anion gap: 18 — ABNORMAL HIGH (ref 5–15)
BUN: 13 mg/dL (ref 6–20)
CO2: 17 mmol/L — ABNORMAL LOW (ref 22–32)
Calcium: 9.2 mg/dL (ref 8.9–10.3)
Chloride: 91 mmol/L — ABNORMAL LOW (ref 98–111)
Creatinine, Ser: 1.25 mg/dL — ABNORMAL HIGH (ref 0.61–1.24)
GFR calc Af Amer: >60 mL/min (ref >60)
GFR calc non Af Amer: >60 mL/min (ref >60)
Glucose, Bld: 718 mg/dL (ref 70–99)
Potassium: 4.7 mmol/L (ref 3.5–5.1)
Sodium: 126 mmol/L — ABNORMAL LOW (ref 135–145)
Total Bilirubin: 1.4 mg/dL — ABNORMAL HIGH (ref 0.3–1.2)
Total Protein: 7.5 g/dL (ref 6.5–8.1)

## 2019-09-27 LAB — GLUCOSE, CAPILLARY
Glucose-Capillary: 210 mg/dL — ABNORMAL HIGH (ref 70–99)
Glucose-Capillary: 174 mg/dL — ABNORMAL HIGH (ref 70–99)
Glucose-Capillary: 155 mg/dL — ABNORMAL HIGH (ref 70–99)
Glucose-Capillary: 133 mg/dL — ABNORMAL HIGH (ref 70–99)
Glucose-Capillary: 128 mg/dL — ABNORMAL HIGH (ref 70–99)
Glucose-Capillary: 114 mg/dL — ABNORMAL HIGH (ref 70–99)
Glucose-Capillary: 167 mg/dL — ABNORMAL HIGH (ref 70–99)
Glucose-Capillary: 206 mg/dL — ABNORMAL HIGH (ref 70–99)

## 2019-09-27 LAB — CBG MONITORING, ED
Glucose-Capillary: 596 mg/dL (ref 70–99)
Glucose-Capillary: 361 mg/dL — ABNORMAL HIGH (ref 70–99)
Glucose-Capillary: 226 mg/dL — ABNORMAL HIGH (ref 70–99)
Glucose-Capillary: 159 mg/dL — ABNORMAL HIGH (ref 70–99)

## 2019-09-27 LAB — CBC WITH DIFFERENTIAL/PLATELET
Abs Immature Granulocytes: 0.08 10*3/uL — ABNORMAL HIGH (ref 0.00–0.07)
Basophils Absolute: 0 10*3/uL (ref 0.0–0.1)
Basophils Relative: 0 %
Eosinophils Absolute: 0.1 10*3/uL (ref 0.0–0.5)
Eosinophils Relative: 1 %
HCT: 42.6 % (ref 39.0–52.0)
Hemoglobin: 14.6 g/dL (ref 13.0–17.0)
Immature Granulocytes: 1 %
Lymphocytes Relative: 9 %
Lymphs Abs: 0.9 10*3/uL (ref 0.7–4.0)
MCH: 31.1 pg (ref 26.0–34.0)
MCHC: 34.3 g/dL (ref 30.0–36.0)
MCV: 90.6 fL (ref 80.0–100.0)
Monocytes Absolute: 0.7 10*3/uL (ref 0.1–1.0)
Monocytes Relative: 7 %
Neutro Abs: 8 10*3/uL — ABNORMAL HIGH (ref 1.7–7.7)
Neutrophils Relative %: 82 %
Platelets: 220 10*3/uL (ref 150–400)
RBC: 4.7 MIL/uL (ref 4.22–5.81)
RDW: 12 % (ref 11.5–15.5)
WBC: 9.8 10*3/uL (ref 4.0–10.5)
nRBC: 0 % (ref 0.0–0.2)

## 2019-09-27 LAB — HEMOGLOBIN A1C
Hgb A1c MFr Bld: 13.3 % — ABNORMAL HIGH (ref 4.8–5.6)
Mean Plasma Glucose: 335.01 mg/dL

## 2019-09-27 LAB — URINALYSIS, ROUTINE W REFLEX MICROSCOPIC
Bacteria, UA: NONE SEEN
Bilirubin Urine: NEGATIVE
Glucose, UA: >=500 mg/dL — AB
Ketones, ur: 20 mg/dL — AB
Leukocytes,Ua: NEGATIVE
Nitrite: NEGATIVE
Protein, ur: NEGATIVE mg/dL
Specific Gravity, Urine: 1.032 — ABNORMAL HIGH (ref 1.005–1.030)
pH: 6 (ref 5.0–8.0)

## 2019-09-27 LAB — RESPIRATORY PANEL BY RT PCR (FLU A&B, COVID)
Influenza A by PCR: NEGATIVE
Influenza B by PCR: NEGATIVE
SARS Coronavirus 2 by RT PCR: NEGATIVE

## 2019-09-27 LAB — BETA-HYDROXYBUTYRIC ACID
Beta-Hydroxybutyric Acid: 2.51 mmol/L — ABNORMAL HIGH (ref 0.05–0.27)
Beta-Hydroxybutyric Acid: 0.08 mmol/L (ref 0.05–0.27)
Beta-Hydroxybutyric Acid: 1 mmol/L — ABNORMAL HIGH (ref 0.05–0.27)

## 2019-09-27 LAB — MAGNESIUM: Magnesium: 1.8 mg/dL (ref 1.7–2.4)

## 2019-09-27 SURGERY — INCISION AND DRAINAGE, ABSCESS, PERIRECTAL
Anesthesia: General | Laterality: Right

## 2019-09-27 MED ORDER — POLYETHYLENE GLYCOL 3350 17 G PO PACK: 17.0000 g | PACK | Freq: Every day | ORAL | Status: DC | PRN

## 2019-09-27 MED ORDER — INSULIN ASPART 100 UNIT/ML ~~LOC~~ SOLN
0.0000 [IU] | Freq: Three times a day (TID) | SUBCUTANEOUS | Status: DC
  Administered 2019-09-28: 5 [IU] via SUBCUTANEOUS

## 2019-09-27 MED ORDER — CLINDAMYCIN PHOSPHATE 600 MG/50ML IV SOLN
600.0000 mg | Freq: Three times a day (TID) | INTRAVENOUS | Status: DC
  Administered 2019-09-27 – 2019-09-30 (×8): 600 mg via INTRAVENOUS
  Filled 2019-09-27 (×10): qty 50

## 2019-09-27 MED ORDER — ONDANSETRON HCL 4 MG/2ML IJ SOLN: 4.0000 mg | Freq: Once | INTRAMUSCULAR | Status: DC | PRN

## 2019-09-27 MED ORDER — ACETAMINOPHEN 10 MG/ML IV SOLN
INTRAVENOUS | Status: AC
  Filled 2019-09-27: qty 100

## 2019-09-27 MED ORDER — FENTANYL CITRATE (PF) 100 MCG/2ML IJ SOLN
25.0000 ug | Freq: Once | INTRAMUSCULAR | Status: AC
  Administered 2019-09-27: 25 ug via INTRAVENOUS
  Filled 2019-09-27: qty 2

## 2019-09-27 MED ORDER — FENTANYL CITRATE (PF) 100 MCG/2ML IJ SOLN: 25.0000 ug | INTRAMUSCULAR | Status: DC | PRN

## 2019-09-27 MED ORDER — INSULIN ASPART 100 UNIT/ML ~~LOC~~ SOLN
10.0000 [IU] | Freq: Once | SUBCUTANEOUS | Status: AC
  Administered 2019-09-27: 10 [IU] via INTRAVENOUS

## 2019-09-27 MED ORDER — DEXTROSE 50 % IV SOLN: 0.0000 mL | INTRAVENOUS | Status: DC | PRN

## 2019-09-27 MED ORDER — ACETAMINOPHEN 650 MG RE SUPP: 650.0000 mg | Freq: Four times a day (QID) | RECTAL | Status: DC | PRN

## 2019-09-27 MED ORDER — INSULIN ASPART 100 UNIT/ML ~~LOC~~ SOLN
0.0000 [IU] | Freq: Every day | SUBCUTANEOUS | Status: DC
  Administered 2019-09-27: 2 [IU] via SUBCUTANEOUS

## 2019-09-27 MED ORDER — FENTANYL CITRATE (PF) 250 MCG/5ML IJ SOLN
INTRAMUSCULAR | Status: DC | PRN
  Administered 2019-09-27 (×2): 100 ug via INTRAVENOUS
  Administered 2019-09-27: 50 ug via INTRAVENOUS
  Administered 2019-09-27 (×2): 100 ug via INTRAVENOUS
  Administered 2019-09-27: 50 ug via INTRAVENOUS

## 2019-09-27 MED ORDER — SODIUM CHLORIDE 0.9 % IV SOLN
INTRAVENOUS | Status: DC
  Administered 2019-09-27: 07:00:00 75 mL/h via INTRAVENOUS

## 2019-09-27 MED ORDER — PIPERACILLIN-TAZOBACTAM 3.375 G IVPB 30 MIN
3.3750 g | Freq: Once | INTRAVENOUS | Status: AC
  Administered 2019-09-27: 3.375 g via INTRAVENOUS
  Filled 2019-09-27: qty 50

## 2019-09-27 MED ORDER — HYDROCODONE-ACETAMINOPHEN 5-325 MG PO TABS
1.0000 | ORAL_TABLET | Freq: Four times a day (QID) | ORAL | Status: DC | PRN
  Administered 2019-09-27 – 2019-09-28 (×2): 2 via ORAL
  Filled 2019-09-27 (×2): qty 1
  Filled 2019-09-27: qty 2

## 2019-09-27 MED ORDER — ATORVASTATIN CALCIUM 10 MG PO TABS
20.0000 mg | ORAL_TABLET | Freq: Every day | ORAL | Status: DC
  Administered 2019-09-28 – 2019-10-03 (×6): 20 mg via ORAL
  Filled 2019-09-27 (×6): qty 2

## 2019-09-27 MED ORDER — MIDAZOLAM HCL 5 MG/5ML IJ SOLN
INTRAMUSCULAR | Status: DC | PRN
  Administered 2019-09-27: 2 mg via INTRAVENOUS

## 2019-09-27 MED ORDER — FENTANYL CITRATE (PF) 250 MCG/5ML IJ SOLN
INTRAMUSCULAR | Status: AC
  Filled 2019-09-27: qty 5

## 2019-09-27 MED ORDER — MORPHINE SULFATE (PF) 4 MG/ML IV SOLN
4.0000 mg | Freq: Once | INTRAVENOUS | Status: AC
  Administered 2019-09-27: 05:00:00 4 mg via INTRAVENOUS
  Filled 2019-09-27: qty 1

## 2019-09-27 MED ORDER — LACTATED RINGERS IV BOLUS
1000.0000 mL | INTRAVENOUS | Status: AC
  Administered 2019-09-27 (×2): 1000 mL via INTRAVENOUS

## 2019-09-27 MED ORDER — SODIUM CHLORIDE 0.9 % IV BOLUS
1000.0000 mL | Freq: Once | INTRAVENOUS | Status: AC
  Administered 2019-09-27: 1000 mL via INTRAVENOUS

## 2019-09-27 MED ORDER — IOHEXOL 300 MG/ML  SOLN
100.0000 mL | Freq: Once | INTRAMUSCULAR | Status: AC | PRN
  Administered 2019-09-27: 100 mL via INTRAVENOUS

## 2019-09-27 MED ORDER — INSULIN GLARGINE 100 UNIT/ML ~~LOC~~ SOLN
15.0000 [IU] | Freq: Once | SUBCUTANEOUS | Status: AC
  Administered 2019-09-27: 17:00:00 15 [IU] via SUBCUTANEOUS
  Filled 2019-09-27: qty 0.15

## 2019-09-27 MED ORDER — PROPOFOL 10 MG/ML IV BOLUS
INTRAVENOUS | Status: AC
  Filled 2019-09-27: qty 20

## 2019-09-27 MED ORDER — DEXTROSE-NACL 5-0.45 % IV SOLN: INTRAVENOUS | Status: DC

## 2019-09-27 MED ORDER — DIPHENHYDRAMINE HCL 50 MG/ML IJ SOLN
INTRAMUSCULAR | Status: DC | PRN
  Administered 2019-09-27: 12.5 mg via INTRAVENOUS

## 2019-09-27 MED ORDER — ACETAMINOPHEN 10 MG/ML IV SOLN
1000.0000 mg | Freq: Once | INTRAVENOUS | Status: AC
  Administered 2019-09-27: 12:00:00 1000 mg via INTRAVENOUS

## 2019-09-27 MED ORDER — CLINDAMYCIN PHOSPHATE 900 MG/50ML IV SOLN
900.0000 mg | Freq: Once | INTRAVENOUS | Status: AC
  Administered 2019-09-27: 900 mg via INTRAVENOUS
  Filled 2019-09-27: qty 50

## 2019-09-27 MED ORDER — POTASSIUM CHLORIDE 10 MEQ/100ML IV SOLN
10.0000 meq | INTRAVENOUS | Status: AC
  Administered 2019-09-27 (×2): 10 meq via INTRAVENOUS
  Filled 2019-09-27 (×2): qty 100

## 2019-09-27 MED ORDER — SODIUM CHLORIDE 0.9 % IV SOLN
1.0000 g | Freq: Three times a day (TID) | INTRAVENOUS | Status: DC
  Administered 2019-09-27 – 2019-09-30 (×8): 1 g via INTRAVENOUS
  Filled 2019-09-27 (×10): qty 1

## 2019-09-27 MED ORDER — VANCOMYCIN HCL IN DEXTROSE 1-5 GM/200ML-% IV SOLN
1000.0000 mg | Freq: Two times a day (BID) | INTRAVENOUS | Status: DC
  Administered 2019-09-27 – 2019-09-30 (×7): 1000 mg via INTRAVENOUS
  Filled 2019-09-27 (×7): qty 200

## 2019-09-27 MED ORDER — INSULIN REGULAR(HUMAN) IN NACL 100-0.9 UT/100ML-% IV SOLN
INTRAVENOUS | Status: DC
  Administered 2019-09-27: 15 [IU]/h via INTRAVENOUS
  Filled 2019-09-27: qty 100

## 2019-09-27 MED ORDER — ACETAMINOPHEN 325 MG PO TABS: 650.0000 mg | ORAL_TABLET | Freq: Four times a day (QID) | ORAL | Status: DC | PRN

## 2019-09-27 MED ORDER — PHENYLEPHRINE 40 MCG/ML (10ML) SYRINGE FOR IV PUSH (FOR BLOOD PRESSURE SUPPORT)
PREFILLED_SYRINGE | INTRAVENOUS | Status: DC | PRN
  Administered 2019-09-27: 120 ug via INTRAVENOUS

## 2019-09-27 MED ORDER — VANCOMYCIN HCL 1750 MG/350ML IV SOLN
1750.0000 mg | Freq: Once | INTRAVENOUS | Status: AC
  Administered 2019-09-27: 1750 mg via INTRAVENOUS
  Filled 2019-09-27: qty 350

## 2019-09-27 MED ORDER — LACTATED RINGERS IV SOLN: INTRAVENOUS | Status: DC

## 2019-09-27 MED ORDER — HYDROMORPHONE HCL 1 MG/ML IJ SOLN
0.5000 mg | INTRAMUSCULAR | Status: DC | PRN
  Administered 2019-09-28 – 2019-10-01 (×8): 0.5 mg via INTRAVENOUS
  Filled 2019-09-27 (×11): qty 1

## 2019-09-27 MED ORDER — MIDAZOLAM HCL 2 MG/2ML IJ SOLN
INTRAMUSCULAR | Status: AC
  Filled 2019-09-27: qty 2

## 2019-09-27 MED ORDER — ONDANSETRON HCL 4 MG/2ML IJ SOLN
INTRAMUSCULAR | Status: DC | PRN
  Administered 2019-09-27: 4 mg via INTRAVENOUS

## 2019-09-27 MED ORDER — ACETAMINOPHEN 325 MG PO TABS
325.0000 mg | ORAL_TABLET | Freq: Once | ORAL | Status: AC
  Administered 2019-09-27: 325 mg via ORAL
  Filled 2019-09-27: qty 1

## 2019-09-27 SURGICAL SUPPLY — 35 items
BNDG GAUZE ELAST 4 BULKY (GAUZE/BANDAGES/DRESSINGS) ×1 IMPLANT
BRIEF STRETCH FOR OB PAD LRG (UNDERPADS AND DIAPERS) ×2 IMPLANT
CANISTER SUCT 3000ML PPV (MISCELLANEOUS) ×2 IMPLANT
COVER SURGICAL LIGHT HANDLE (MISCELLANEOUS) ×2 IMPLANT
COVER WAND RF STERILE (DRAPES) ×1 IMPLANT
DRAIN PENROSE 0.5X18 (DRAIN) ×1 IMPLANT
DRAPE UNDERBUTTOCKS STRL (DISPOSABLE) ×1 IMPLANT
DRSG PAD ABDOMINAL 8X10 ST (GAUZE/BANDAGES/DRESSINGS) ×3 IMPLANT
ELECT CAUTERY BLADE 6.4 (BLADE) ×2 IMPLANT
ELECT REM PT RETURN 9FT ADLT (ELECTROSURGICAL)
ELECTRODE REM PT RTRN 9FT ADLT (ELECTROSURGICAL) IMPLANT
GAUZE PACKING IODOFORM 1X5 (MISCELLANEOUS) IMPLANT
GAUZE SPONGE 4X4 12PLY STRL (GAUZE/BANDAGES/DRESSINGS) ×2 IMPLANT
GLOVE BIO SURGEON STRL SZ7 (GLOVE) ×2 IMPLANT
GLOVE BIOGEL PI IND STRL 7.5 (GLOVE) ×1 IMPLANT
GLOVE BIOGEL PI INDICATOR 7.5 (GLOVE) ×1
GOWN STRL REUS W/ TWL LRG LVL3 (GOWN DISPOSABLE) ×2 IMPLANT
GOWN STRL REUS W/TWL LRG LVL3 (GOWN DISPOSABLE) ×4
KIT BASIN OR (CUSTOM PROCEDURE TRAY) ×2 IMPLANT
KIT TURNOVER KIT B (KITS) ×2 IMPLANT
NDL HYPO 25GX1X1/2 BEV (NEEDLE) ×1 IMPLANT
NEEDLE HYPO 25GX1X1/2 BEV (NEEDLE) ×2 IMPLANT
NS IRRIG 1000ML POUR BTL (IV SOLUTION) ×2 IMPLANT
PACK GENERAL/GYN (CUSTOM PROCEDURE TRAY) IMPLANT
PACK LITHOTOMY IV (CUSTOM PROCEDURE TRAY) ×2 IMPLANT
PAD ARMBOARD 7.5X6 YLW CONV (MISCELLANEOUS) ×2 IMPLANT
PENCIL SMOKE EVACUATOR (MISCELLANEOUS) ×2 IMPLANT
SURGILUBE 2OZ TUBE FLIPTOP (MISCELLANEOUS) IMPLANT
SUT SILK 2 0 SH (SUTURE) ×3 IMPLANT
SWAB COLLECTION DEVICE MRSA (MISCELLANEOUS) ×1 IMPLANT
SWAB CULTURE ESWAB REG 1ML (MISCELLANEOUS) ×1 IMPLANT
SYR CONTROL 10ML LL (SYRINGE) ×2 IMPLANT
TOWEL GREEN STERILE (TOWEL DISPOSABLE) ×2 IMPLANT
TOWEL GREEN STERILE FF (TOWEL DISPOSABLE) ×2 IMPLANT
UNDERPAD 30X30 (UNDERPADS AND DIAPERS) ×2 IMPLANT

## 2019-09-27 NOTE — ED Notes (Signed)
Surgery at bedside.

## 2019-09-27 NOTE — ED Notes (Signed)
Notified PA of pt cbg.  Also notified internal medicine. They were on their way to OR to see if they could assess the pt before he was taken to surgery.

## 2019-09-27 NOTE — ED Provider Notes (Signed)
Physical Exam  BP 137/81   Pulse (!) 104   Temp (!) 97.5 F (36.4 C) (Oral)   Resp (!) 29   Ht 5\' 6"  (1.676 m)   Wt 90 kg   SpO2 98%   BMI 32.02 kg/m   Physical Exam Vitals and nursing note reviewed.  Constitutional:      General: He is not in acute distress.    Appearance: He is well-developed. He is not diaphoretic.  HENT:     Head: Normocephalic and atraumatic.  Eyes:     General: No scleral icterus.    Conjunctiva/sclera: Conjunctivae normal.  Pulmonary:     Effort: Pulmonary effort is normal. No respiratory distress.  Musculoskeletal:     Cervical back: Normal range of motion.  Skin:    Findings: No rash.     Comments: Foul smelling brown discharge from R thigh abscess without specific area of drainage.  Neurological:     Mental Status: He is alert.     ED Course/Procedures     Procedures  MDM   Care of patient assumed from PA [place] at 7:00 AM.  Agree with history, physical exam and plan.  See their note for further details.  Briefly, 49 y.o. male with PMH/PSH as below who presents with chief complaint of abscess on right inner thigh for the past few days.  Denies any fevers.  Patient found to be in mild DKA here with blood sugars in the 700s, anion gap of 18.  Concern for necrotizing fasciitis on examination of the right thigh abscess as there is crepitus and brown drainage. Patient started on vancomycin, clindamycin, Zosyn, and insulin infusion. Patient seen in clinic on 09/23/2019 for this abscess and was prescribed Bactrim, which he has been taking.   Past Medical History:  Diagnosis Date  . Cellulitis 11/2017   of face   . Diabetes (HCC)    new dx  . History of MRSA infection    Past Surgical History:  Procedure Laterality Date  . APPENDECTOMY    . KNEE SURGERY    . right knee surgery        Current Plan: Awaiting results of CT of the pelvis to further evaluate the abscess. Will need admission for DKA at the very least. CBG improved to  361.  MDM/ED Course: CT shows no discrete abscess but does show fluid and air within this area without any definite involvement of the scrotum. Unable to exclude necrotizing fasciitis, although there is clinical concern for this.   Consults: General surgery- Dr. 12/2017 to see patient in consult. IMTS- admission  Significant labs/images: CT PELVIS W CONTRAST  Result Date: 09/27/2019 CLINICAL DATA:  Abscess in upper thigh. Evaluate extension into perineum. EXAM: CT PELVIS WITH CONTRAST TECHNIQUE: Multidetector CT imaging of the pelvis was performed using the standard protocol following the bolus administration of intravenous contrast. CONTRAST:  09/29/2019 OMNIPAQUE IOHEXOL 300 MG/ML  SOLN COMPARISON:  None. FINDINGS: Urinary Tract:  No abnormality visualized. Bowel:  Unremarkable visualized pelvic bowel loops. Vascular/Lymphatic: No pathologically enlarged lymph nodes. No significant vascular abnormality seen. Reproductive:  No mass or other significant abnormality Other: There is air and fluid in the subcutaneous soft tissues with overlying skin thickening. The largest collection is seen in the upper medial thigh. There is extension into the right mons pubis with surrounding fat stranding. No definite involvement of the right spermatic cord. The fluid and air extends posteriorly into the right buttocks with overlying skin thickening. The fluid and air  is not particularly well-defined and relatively diffuse throughout the soft tissues. There is significant surrounding fat stranding. There is overlying skin thickening. The fluid and air abuts the medial musculature of the upper thigh. There is not a clear fat plane between the fluid and air and the adjacent musculature. No bony erosion. Musculoskeletal: No suspicious bone lesions identified. IMPRESSION: 1. Fluid and air is seen in the right medial thigh extending into the right mons pubis without definite involvement of the spermatic cord. The fluid and air  extends inferiorly and posteriorly into the right buttocks, abutting the intergluteal cleft. There is significant surrounding fat stranding and overlying skin thickening. The area involved is quite large extending from the anteromedial right thigh into the posterior right buttocks. No definite involvement of the scrotum. The fluid and air is not particularly well-defined. The findings are consistent with an infectious process. There is not 1 discrete abscess. The findings are otherwise nonspecific. Necrotizing fasciitis cannot be excluded on CT imaging. Recommend clinical correlation. 2. No other abnormalities. Electronically Signed   By: Dorise Bullion III M.D   On: 09/27/2019 06:59     I personally reviewed and interpreted all labs.      Delia Heady, PA-C 09/27/19 2010    Fredia Sorrow, MD 09/28/19 [phone]

## 2019-09-27 NOTE — Anesthesia Postprocedure Evaluation (Signed)
Anesthesia Post Note  Patient: Logan Weiss  Procedure(s) Performed: IRRIGATION AND DEBRIDEMENT RIGHT THIGH, PERINEUM, AND SCROTUM (Right )     Patient location during evaluation: PACU Anesthesia Type: General Level of consciousness: awake and alert Pain management: pain level controlled Vital Signs Assessment: post-procedure vital signs reviewed and stable Respiratory status: spontaneous breathing, nonlabored ventilation, respiratory function stable and patient connected to nasal cannula oxygen Cardiovascular status: blood pressure returned to baseline and stable Postop Assessment: no apparent nausea or vomiting Anesthetic complications: no    Last Vitals:  Vitals:   09/27/19 1315 09/27/19 1345  BP: 107/65 99/71  Pulse: (!) 109 95  Resp: 18 20  Temp: 37.9 C   SpO2: 97% 94%    Last Pain:  Vitals:   09/27/19 1315  TempSrc:   PainSc: 0-No pain                 Kennieth Rad

## 2019-09-27 NOTE — H&P (Signed)
Date: 09/27/2019               Patient Name:  Logan Weiss MRN: [id number]  DOB: 30-Apr-1971 Age / Sex: 49 y.o., male   PCP: Lilian Coma., MD         Medical Service: Internal Medicine Teaching Service         Attending Physician: Dr. Lucious Groves, DO    First Contact: Dr. Ladona Horns Pager: [phone]  Second Contact: Dr. Molli Hazard Pager: [phone]       After Hours (After 5p/  First Contact Pager: [phone]  weekends / holidays): Second Contact Pager: [phone]   Chief Complaint: abscess  History of Present Illness:  Logan Weiss is a 49 year old M with significant PMH of uncontrolled type II diabetes who presents with a R buttock abscess. Logan Weiss states Logan Weiss noticed having having pain in the groin/buttocks region around 5-7 days ago. It began getting more and more painful to walk. Logan Weiss saw his PCP 4 days ago, and was started on Bactrim and referred to general surgery. Since that time, Logan Weiss began having fever and chills at home, T max 100. Denies shortness of breath, chest pain, cough/congestion, dizziness/lightheadedness, abdominal pain, vomiting, or dysuria. Endorsing some nausea, though Logan Weiss is uncertain is that is related to his new diabetes medication, as started by his PCP at his last visit.  Logan Weiss endorses a 5 year history of DM. His medications have been changes many times secondary to cost and insurance coverage, so Logan Weiss cannot recall the names of his current regimen. Does know that Logan Weiss is on an all oral regimen, denies any insulin use. Per chart review, Logan Weiss unable to fill dapagliflozin secondary to cost so his pioglitazone dose was increased to 30mg  daily. Also documented as starting glimepiride 4mg  daily during February PCP visit. Logan Weiss endorses checking his sugars 3x per day when Logan Weiss's doing things right. Often sees his sugars range from 300-400, but says they have been coming down after his recent medication adjustments. Logan Weiss remembers previously many years ago being on metformin, then on invokana  through a discount program. At some point his doctor told him Logan Weiss could get off the medications but that didn't last for long.   In the ED, Logan Weiss had a low-grade fever of 100.3, tachycardic with HR in the 100's, BP elevated to 150/69, with RR 18 and O2 saturation 98% on room air. Lab work significant for glc 718, bicarb 17, anion gap 18, Cr 1.25, Na 126 though corrected to 136, and WBC 9.8 with elevated absolute neutrophil count to 8.0. UA with >500 glc, 20 ketones, small Hgb, and 0-5 WBC and RBCs. BHB elevated to 2.5 and Hemoglobin A1c 13.3. Logan Weiss started on fluids and an insulin drip.  CT imaging of the pelvis revealed large area from the anteromedial R thigh to the posterior R buttock with fluid and air and without discrete abscess consistent with an infectious process, nec fasc could not be excluded. General surgery consulted, Logan Weiss started on vanc/zosyn/clinda and OR booked for emergent I&D.  Social:  Logan Weiss lives in [place] with his wife. Works full time at a desk job with an Nutritional therapist and part-time at [employer] in [place]. States Logan Weiss stopped smoking about one month ago. Before then Logan Weiss smoked a pack per month, mostly socially. Also has not drank EtOH in one month. Denies heavy EtOH use before then, or marijuana/other drug use.  Family History:  Mother - diabetes Father -  diabetes, HTN, and HLD Denies family history of CKD, lung disease, or cancers.  Meds:  Current Meds  Medication Sig  . Ascorbic Acid (VITAMIN C) 100 MG tablet Take 1 tablet by mouth daily.  Marland Kitchen atorvastatin (LIPITOR) 20 MG tablet Take 20 mg by mouth daily.  . Cholecalciferol 125 MCG (5000 UT) capsule Take 1 tablet by mouth daily.  Marland Kitchen glimepiride (AMARYL) 4 MG tablet Take 4 mg by mouth daily.  . pioglitazone (ACTOS) 30 MG tablet Take 30 mg by mouth daily.  Marland Kitchen sulfamethoxazole-trimethoprim (BACTRIM DS) 800-160 MG tablet Take 1 tablet by mouth 2 (two) times daily.  . vitamin E 200 UNIT capsule Take 200 Units by mouth  daily.   Allergies: Allergies as of 09/27/2019 - Review Complete 09/27/2019  Allergen Reaction Noted  . Metformin and related Diarrhea and Nausea And Vomiting 08/04/2019  . Doxycycline Hives 07/28/2017   Past Medical History:  Diagnosis Date  . Cellulitis 11/2017   of face   . Diabetes (HCC)    new dx  . History of MRSA infection    Review of Systems: A complete ROS was negative except as per HPI.   Physical Exam: Blood pressure 109/71, pulse 96, temperature 98.6 F (37 C), temperature source Oral, resp. rate 18, height 5\' 6"  (1.676 m), weight 90 kg, SpO2 96 %. Physical Exam Vitals and nursing note reviewed.  Constitutional:      General: Logan Weiss is not in acute distress.    Appearance: Logan Weiss is not ill-appearing.  HENT:     Head: Normocephalic and atraumatic.  Cardiovascular:     Rate and Rhythm: Normal rate and regular rhythm.     Heart sounds: Normal heart sounds.  Pulmonary:     Effort: Pulmonary effort is normal. No respiratory distress.     Breath sounds: Normal breath sounds. No wheezing, rhonchi or rales.  Abdominal:     General: Abdomen is flat. Bowel sounds are normal.     Palpations: Abdomen is soft.     Tenderness: There is no abdominal tenderness.  Genitourinary:    Comments: Perineal abscess with copious foul smelling drainage on underlying chux pad. Musculoskeletal:        General: Normal range of motion.     Right lower leg: No edema.     Left lower leg: No edema.  Skin:    General: Skin is warm and dry.  Neurological:     Mental Status: Logan Weiss is alert and oriented to person, place, and time.     Comments: Moving all extremities spontaneously and without focal deficit.  Psychiatric:        Mood and Affect: Mood normal.    CT Pelvis IMPRESSION: 1. Fluid and air is seen in the right medial thigh extending into the right mons pubis without definite involvement of the spermatic cord. The fluid and air extends inferiorly and posteriorly into the right  buttocks, abutting the intergluteal cleft. There is significant surrounding fat stranding and overlying skin thickening. The area involved is quite large extending from the anteromedial right thigh into the posterior right buttocks. No definite involvement of the scrotum. The fluid and air is not particularly well-defined. The findings are consistent with an infectious process. There is not 1 discrete abscess. The findings are otherwise nonspecific. Necrotizing fasciitis cannot be excluded on CT imaging. Recommend clinical correlation. 2. No other abnormalities.  Assessment & Plan by Problem: Active Problems:   Abscess   Abscess of multiple sites of perineum  Logan Weiss is  a 49 year old M with significant PMH of uncontrolled type II diabetes who presents with a R perineal abscess and soft tissue infection with subsequent hyperglycemia.  R perineal abscess and soft tissue infection Logan Weiss with 5-7 day history of R groin pain. Received 4 days of Bactrim outpatient, before presenting with low grade fever and tachycardia. CT imaging of the pelvis revealed large area from the anteromedial R thigh to the posterior R buttock with fluid and air and without discrete abscess consistent with an infectious process, nec fasc could not be excluded. No leukocytosis with WBC 9.8. General surgery consulted, appreciate their input in this case - emergent I&D today - vanc/zosyn/clinda - Logan Weiss will possibly need repeat debridements - follow-up gram stain and surgical cultures  DKA Initial labs with Glc 718, bicarb 17, anion gap 18, elevated BHB of 2.5, UA with 20 ketones, and A1c 13.3. Hyperglycemia and DKA likely secondary to infectious process in the setting of uncontrolled type II diabetes. - continue insulin drip and fluids per Endotool protocol - sugars improved to 186 and gap closed x2 at the time of surgery - can likely transition to subQ insulin tonight after surgery - hopeful for better CBG control after  surgical debridement - continue q4h BMP  Uncontrolled type II DM A1c 13.3 on admission. Per chart review, Logan Weiss on pioglitazone 30mg  daily and glimepiride 4mg  daily. - Logan Weiss did not pick up SGLT-2 prescription per PCP note on 3/17, so do not think his perineum infection is related to dapagliflozin  - though given his history now, would avoid SGLT-2's in the future - diabetes coordinator consulted - insulin regimen per Endotool protocol - Logan Weiss may need insulin on discharge for better glycemic control   Diet: NPO, carb modified after surgery VTE: SCDs IVF: per Endotool protocol Code: FULL CODE  Dispo: Admit patient to Inpatient with expected length of stay greater than 2 midnights.  Signed: , MD 09/27/2019, 4:47 PM  Pager: [phone]

## 2019-09-27 NOTE — Anesthesia Procedure Notes (Signed)
Procedure Name: Intubation Date/Time: 09/27/2019 10:47 AM Performed by: Shirlyn Goltz, CRNA Pre-anesthesia Checklist: Emergency Drugs available, Suction available, Patient identified and Patient being monitored Patient Re-evaluated:Patient Re-evaluated prior to induction Oxygen Delivery Method: Circle system utilized Preoxygenation: Pre-oxygenation with 100% oxygen Induction Type: IV induction and Rapid sequence Laryngoscope Size: Mac and 4 Grade View: Grade II Tube type: Oral Tube size: 7.0 mm Number of attempts: 1 Airway Equipment and Method: Stylet Placement Confirmation: ETT inserted through vocal cords under direct vision,  positive ETCO2 and breath sounds checked- equal and bilateral Secured at: 23 cm Tube secured with: Tape Dental Injury: Teeth and Oropharynx as per pre-operative assessment

## 2019-09-27 NOTE — Op Note (Signed)
   Operative Note   Date: 09/27/2019  Procedure: irrigation and debridement of R perineum and thigh  Pre-op diagnosis: R perineal abscess and soft tissue infection Post-op diagnosis: same  Indication and clinical history: The patient is a 49 y.o. year old male with R perineal abscess and soft tissue infection.     Surgeon: Diamantina Monks, MD  Anesthesia: General  Findings:  . Specimen: aerobic, anaerobic cultures . EBL: <10cccc . Drains/Implants: penrose x2  Disposition: PACU - hemodynamically stable.  Description of procedure: The patient was positioned supine on the operating room table. General anesthetic induction and intubation were uneventful. The patient was re-positioned to lithotomy. Time-out was performed verifying correct patient, procedure, signature of informed consent, and administration of pre-operative antibiotics.   The perineum was able to be better examined and a punctate area of purulent, foul-smelling drainage was noted at the R perineum. The perineum was sharply incised at this point. The wound tracked both anteriorly to the base of the scrotum and posteriorly to the thigh-gluteal fold. Counter-incisions were made at each of these sites. The underlying tissue was debrided with a curette and the cavity irrigated. Two penrose drains were placed and secured with suture.   Sterile dressings were applied. All sponge and instrument counts were correct at the conclusion of the procedure. The patient was awakened from anesthesia, extubated uneventfully, and transported to the PACU in good condition. There were no complications. During emergence from anesthesia, the patient had some brown drainage pooling from his right ear. This was also sent for aerobic/anaerobic cultures.    Diamantina Monks, MD General and Trauma Surgery [facility]

## 2019-09-27 NOTE — Anesthesia Preprocedure Evaluation (Signed)
Anesthesia Evaluation  Patient identified by MRN, date of birth, ID band Patient awake    Reviewed: Allergy & Precautions, NPO status , Patient's Chart, lab work & pertinent test results  Airway Mallampati: III  TM Distance: >3 FB Neck ROM: Full    Dental  (+) Dental Advisory Given   Pulmonary Current Smoker,    breath sounds clear to auscultation       Cardiovascular negative cardio ROS   Rhythm:Regular Rate:Normal     Neuro/Psych negative neurological ROS     GI/Hepatic negative GI ROS, Neg liver ROS,   Endo/Other  diabetes (DKA)Morbid obesity  Renal/GU negative Renal ROS     Musculoskeletal   Abdominal   Peds  Hematology negative hematology ROS (+)   Anesthesia Other Findings   Reproductive/Obstetrics                             Anesthesia Physical Anesthesia Plan  ASA: III and emergent  Anesthesia Plan: General   Post-op Pain Management:    Induction: Intravenous and Rapid sequence  PONV Risk Score and Plan: 1 and Ondansetron, Midazolam, Diphenhydramine and Treatment may vary due to age or medical condition  Airway Management Planned: Oral ETT  Additional Equipment:   Intra-op Plan:   Post-operative Plan: Extubation in OR  Informed Consent: I have reviewed the patients History and Physical, chart, labs and discussed the procedure including the risks, benefits and alternatives for the proposed anesthesia with the patient or authorized representative who has indicated his/her understanding and acceptance.     Dental advisory given  Plan Discussed with: CRNA  Anesthesia Plan Comments:         Anesthesia Quick Evaluation

## 2019-09-27 NOTE — ED Triage Notes (Signed)
Patient reports worsening abscess at right buttocks this week , denies drainage , no fever or chills .

## 2019-09-27 NOTE — Transfer of Care (Signed)
Immediate Anesthesia Transfer of Care Note  Patient: Logan Weiss  Procedure(s) Performed: IRRIGATION AND DEBRIDEMENT RIGHT THIGH, PERINEUM, AND SCROTUM (Right )  Patient Location: PACU  Anesthesia Type:General  Level of Consciousness: oriented, drowsy and patient cooperative  Airway & Oxygen Therapy: Patient Spontanous Breathing and Patient connected to nasal cannula oxygen  Post-op Assessment: Report given to RN and Post -op Vital signs reviewed and stable  Post vital signs: Reviewed and stable  Last Vitals:  Vitals Value Taken Time  BP 123/74 09/27/19 1143  Temp    Pulse 120 09/27/19 1144  Resp 22 09/27/19 1144  SpO2 93 % 09/27/19 1144  Vitals shown include unvalidated device data.  Last Pain:  Vitals:   09/27/19 0814  TempSrc:   PainSc: 8          Complications: No apparent anesthesia complications

## 2019-09-27 NOTE — ED Provider Notes (Signed)
[name] EMERGENCY DEPARTMENT Provider Note   CSN: [id number] Arrival date & time: 09/27/19  0059     History Chief Complaint  Patient presents with  . Abscess    Logan Weiss is a 49 y.o. male.  Patient with history of diabetes, MRSA, prior cellulitis presents to the emergency department with a chief complaint of abscess.  He states that he noticed an abscess on his right inner thigh a couple of days ago.  It has rapidly worsened.  He denies any known fever.  He has not taken anything for his symptoms.  He denies any other associated symptoms.  He states that pain worsens with sitting, ambulation, and palpation.  The history is provided by the patient. No language interpreter was used.       Past Medical History:  Diagnosis Date  . Cellulitis 11/2017   of face   . Diabetes (HCC)    new dx  . History of MRSA infection     Patient Active Problem List   Diagnosis Date Noted  . Cellulitis, face 11/05/2017  . Type 2 diabetes mellitus with hyperglycemia (HCC) 11/05/2017  . Elevated blood pressure reading 11/05/2017  . Cellulitis 11/05/2017  . 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 History of MRSA infection 06/08/2014    Past Surgical History:  Procedure Laterality Date  . APPENDECTOMY    . KNEE SURGERY    . right knee surgery         Family History  Problem Relation Age of Onset  . Hyperlipidemia Mother   . Mental retardation Mother   . Diabetes Mother   . Diabetes Father   . Hyperlipidemia Father   . Hypertension Father   . Diabetes Maternal Grandmother   . Diabetes Maternal Grandfather   . Diabetes Paternal Grandmother   . Diabetes Paternal Grandfather     Social History   Tobacco Use  . Smoking status: Current Every Day Smoker    Packs/day: 0.00    Years: 5.00    Pack years: 0.00    Types: Cigarettes  . Smokeless tobacco: Never Used  Substance Use Topics  . Alcohol use: No    Alcohol/week:  0.0 standard drinks    Comment: socially  . Drug use: No    Home Medications Prior to Admission medications   Medication Sig Start Date End Date Taking? Authorizing Provider  Ascorbic Acid (VITAMIN C) 100 MG tablet Take 1 tablet by mouth daily.   Yes [provider]  atorvastatin (LIPITOR) 20 MG tablet Take 20 mg by mouth daily. 09/04/19  Yes [provider]  Cholecalciferol 125 MCG (5000 UT) capsule Take 1 tablet by mouth daily.   Yes [provider]  glimepiride (AMARYL) 4 MG tablet Take 4 mg by mouth daily. 08/21/19  Yes [provider]  pioglitazone (ACTOS) 30 MG tablet Take 30 mg by mouth daily. 09/23/19  Yes [provider]  sulfamethoxazole-trimethoprim (BACTRIM DS) 800-160 MG tablet Take 1 tablet by mouth 2 (two) times daily. 09/23/19  Yes [provider]  vitamin E 200 UNIT capsule Take 200 Units by mouth daily.   Yes [provider]  cyclobenzaprine (FLEXERIL) 10 MG tablet Take 1 tablet (10 mg total) by mouth 2 (two) times daily as needed for muscle spasms. Patient not taking: Reported on 04/13/2018 03/21/17   Bethel Born, PA-C  GlucoCom Lancets MISC Check blood sugar TID & QHS 06/22/14   Doris Cheadle, MD  glucose blood (CHOICE DM FORA G20 TEST STRIPS) test strip Use as instructed 06/22/14   Advani, Vernon Prey, MD  pantoprazole (PROTONIX) 20 MG tablet Take 1 tablet (20 mg total) by mouth daily. Patient not taking: Reported on 09/27/2019 04/13/18   Pattricia Boss, MD    Allergies    Metformin and related and Doxycycline  Review of Systems   Review of Systems  All other systems reviewed and are negative.   Physical Exam Updated Vital Signs BP (!) 145/83   Pulse 93   Temp (!) 97.5 F (36.4 C) (Oral)   Resp 19   Ht 5\' 6"  (1.676 m)   Wt 90 kg   SpO2 98%   BMI 32.02 kg/m   Physical Exam Vitals and nursing note reviewed.  Constitutional:      Appearance: He is well-developed.  HENT:     Head: Normocephalic  and atraumatic.  Eyes:     Conjunctiva/sclera: Conjunctivae normal.  Cardiovascular:     Rate and Rhythm: Normal rate and regular rhythm.     Heart sounds: No murmur.  Pulmonary:     Effort: Pulmonary effort is normal. No respiratory distress.     Breath sounds: Normal breath sounds.  Abdominal:     Palpations: Abdomen is soft.     Tenderness: There is no abdominal tenderness.  Genitourinary:    Comments: Large 3 x 4 cm abscess to the right inner proximal thigh with some crepitus, no discharge on my exam Musculoskeletal:        General: Normal range of motion.     Cervical back: Neck supple.  Skin:    General: Skin is warm and dry.  Neurological:     Mental Status: He is alert and oriented to person, place, and time.  Psychiatric:        Mood and Affect: Mood normal.        Behavior: Behavior normal.     ED Results / Procedures / Treatments   Labs (all labs ordered are listed, but only abnormal results are displayed) Labs Reviewed  CBC WITH DIFFERENTIAL/PLATELET - Abnormal; Notable for the following components:      Result Value   Neutro Abs 8.0 (*)    Abs Immature Granulocytes 0.08 (*)    All other components within normal limits  COMPREHENSIVE METABOLIC PANEL - Abnormal; Notable for the following components:   Sodium 126 (*)    Chloride 91 (*)    CO2 17 (*)    Glucose, Bld 718 (*)    Creatinine, Ser 1.25 (*)    Albumin 3.3 (*)    Total Bilirubin 1.4 (*)    Anion gap 18 (*)    All other components within normal limits  URINALYSIS, ROUTINE W REFLEX MICROSCOPIC - Abnormal; Notable for the following components:   Color, Urine STRAW (*)    Specific Gravity, Urine 1.032 (*)    Glucose, UA >=500 (*)    Hgb urine dipstick SMALL (*)    Ketones, ur 20 (*)    All other components within normal limits  HEMOGLOBIN A1C - Abnormal; Notable for the following components:   Hgb A1c MFr Bld 13.3 (*)    All other components within normal limits  CBG MONITORING, ED - Abnormal;  Notable for the following components:   Glucose-Capillary 596 (*)    All other components within normal limits  CBG MONITORING, ED - Abnormal; Notable for the following components:   Glucose-Capillary 361 (*)    All other components within  normal limits  RESPIRATORY PANEL BY RT PCR (FLU A&B, COVID)  BASIC METABOLIC PANEL  BASIC METABOLIC PANEL  BASIC METABOLIC PANEL  BASIC METABOLIC PANEL  BASIC METABOLIC PANEL  BETA-HYDROXYBUTYRIC ACID  BETA-HYDROXYBUTYRIC ACID  BETA-HYDROXYBUTYRIC ACID    EKG None  Radiology No results found.  Procedures .Critical Care Performed by: Roxy Horseman, PA-C Authorized by: Roxy Horseman, PA-C   Critical care provider statement:    Critical care time (minutes):  45   Critical care was time spent personally by me on the following activities:  Discussions with consultants, evaluation of patient's response to treatment, examination of patient, ordering and performing treatments and interventions, ordering and review of laboratory studies, ordering and review of radiographic studies, pulse oximetry, re-evaluation of patient's condition, obtaining history from patient or surrogate and review of old charts   (including critical care time)  Medications Ordered in ED Medications  insulin regular, human (MYXREDLIN) 100 units/ 100 mL infusion (has no administration in time range)  0.9 %  sodium chloride infusion (has no administration in time range)  dextrose 5 %-0.45 % sodium chloride infusion (has no administration in time range)  dextrose 50 % solution 0-50 mL (has no administration in time range)  lactated ringers bolus 1,000 mL (1,000 mLs Intravenous New Bag/Given 09/27/19 0628)  potassium chloride 10 mEq in 100 mL IVPB (has no administration in time range)  vancomycin (VANCOREADY) IVPB 1750 mg/350 mL (1,750 mg Intravenous New Bag/Given 09/27/19 0623)  sodium chloride 0.9 % bolus 1,000 mL (0 mLs Intravenous Stopped 09/27/19 0621)  insulin aspart  (novoLOG) injection 10 Units (10 Units Intravenous Given 09/27/19 0426)  iohexol (OMNIPAQUE) 300 MG/ML solution 100 mL (100 mLs Intravenous Contrast Given 09/27/19 0450)  morphine 4 MG/ML injection 4 mg (4 mg Intravenous Given 09/27/19 0521)  piperacillin-tazobactam (ZOSYN) IVPB 3.375 g (0 g Intravenous Stopped 09/27/19 0621)  clindamycin (CLEOCIN) IVPB 900 mg (0 mg Intravenous Stopped 09/27/19 2353)    ED Course  I have reviewed the triage vital signs and the nursing notes.  Pertinent labs & imaging results that were available during my care of the patient were reviewed by me and considered in my medical decision making (see chart for details).    MDM Rules/Calculators/A&P                     Patient with large abscess to the right inner thigh.  I am concerned about necrotizing fasciitis.  Will order CT.  Patient is also noted to have significantly elevated blood glucose.  His glucose is 718.  Anion gap is 18.  Mild DKA.  Patient seen by discussed with Dr. Manus Gunning, who recommends starting insulin infusion.  Will start broad-spectrum antibiotics for possible necrotizing fasciitis.  Patient is waiting for his CT.  Patient signed out to [place], [place], who will continue care.  Patient will need admission to the hospital with probable general surgery consultation.   Final Clinical Impression(s) / ED Diagnoses Final diagnoses:  None    Rx / DC Orders ED Discharge Orders    None       Roxy Horseman, PA-C 09/27/19 6144    Glynn Octave, MD 09/27/19 [phone]

## 2019-09-27 NOTE — Progress Notes (Signed)
Pharmacy Antibiotic Note  Logan Weiss is a 49 y.o. male admitted on 09/27/2019 with abscess.  Pharmacy has been consulted for vancomycin and meropenem dosing for nec fasc. Pt with Tmax 100.3 and WBC is WNL. Scr is WNL. Pt also started on clindamycin. Pt was loaded with vancomycin earlier today.   Plan: Meropenem 1gm IV Q8H Vancomycin 1gm IV Q12H  F/u renal fxn, C&S, clinical status and peak/trough at SS  Height: 5\' 6"  (167.6 cm) Weight: 198 lb 6.6 oz (90 kg) IBW/kg (Calculated) : 63.8  Temp (24hrs), Avg:99 F (37.2 C), Min:97.5 F (36.4 C), Max:100.3 F (37.9 C)  Recent Labs  Lab 09/27/19 0116 09/27/19 0624 09/27/19 1105 09/27/19 1427 09/27/19 1816  WBC 9.8  --   --   --   --   CREATININE 1.25* 1.14 0.78 0.65 0.77    Estimated Creatinine Clearance: 120 mL/min (by C-G formula based on SCr of 0.77 mg/dL).    Allergies  Allergen Reactions  . Metformin And Related Diarrhea and Nausea And Vomiting  . Doxycycline Hives    Antimicrobials this admission: Vanc 3/21>> Meropenem 3/21>> Clinda 3/21>> Zosyn x 1 3/21  Dose adjustments this admission: N/A  Microbiology results: Pending  Thank you for allowing pharmacy to be a part of this patient's care.  Logan Weiss, 4/21 09/27/2019 7:38 PM

## 2019-09-27 NOTE — Consult Note (Addendum)
Reason for Consult/Chief Complaint: necrotizing fasciitis Consultant: Idelle Leech, [place]  Logan Weiss is an 49 y.o. male.   HPI: 30M with poorly controlled diabetes reports a abscess in his perineum that he noticed on 3/16 that continued to progress. Reports associated fevers and chills during the week and nausea and vomiting that started today. Reports he usually gets abscesses when his blood sugars are high, which they have been, and he is in the middle of medication adjustment for his diabetes by his PCP. Reports adherence to lifestyle recommendations, but not to dietary recommendations.   Past Medical History:  Diagnosis Date  . Cellulitis 11/2017   of face   . Diabetes (HCC)    new dx  . History of MRSA infection     Past Surgical History:  Procedure Laterality Date  . APPENDECTOMY    . KNEE SURGERY    . right knee surgery      Family History  Problem Relation Age of Onset  . Hyperlipidemia Mother   . Mental retardation Mother   . Diabetes Mother   . Diabetes Father   . Hyperlipidemia Father   . Hypertension Father   . Diabetes Maternal Grandmother   . Diabetes Maternal Grandfather   . Diabetes Paternal Grandmother   . Diabetes Paternal Grandfather     Social History:  reports that he has been smoking cigarettes. He has been smoking about 0.00 packs per day for the past 5.00 years. He has never used smokeless tobacco. He reports that he does not drink alcohol or use drugs.  Allergies:  Allergies  Allergen Reactions  . Metformin And Related Diarrhea and Nausea And Vomiting  . Doxycycline Hives    Medications: I have reviewed the patient's current medications.  Results for orders placed or performed during the hospital encounter of 09/27/19 (from the past 48 hour(s))  Urinalysis, Routine w reflex microscopic     Status: Abnormal   Collection Time: 09/27/19  1:14 AM  Result Value Ref Range   Color, Urine STRAW (A) YELLOW   APPearance CLEAR CLEAR   Specific  Gravity, Urine 1.032 (H) 1.005 - 1.030   pH 6.0 5.0 - 8.0   Glucose, UA >=500 (A) NEGATIVE mg/dL   Hgb urine dipstick SMALL (A) NEGATIVE   Bilirubin Urine NEGATIVE NEGATIVE   Ketones, ur 20 (A) NEGATIVE mg/dL   Protein, ur NEGATIVE NEGATIVE mg/dL   Nitrite NEGATIVE NEGATIVE   Leukocytes,Ua NEGATIVE NEGATIVE   RBC / HPF 0-5 0 - 5 RBC/hpf   WBC, UA 0-5 0 - 5 WBC/hpf   Bacteria, UA NONE SEEN NONE SEEN    Comment: Performed at [facility], [address], [place], [place] [address]  CBC with Differential     Status: Abnormal   Collection Time: 09/27/19  1:16 AM  Result Value Ref Range   WBC 9.8 4.0 - 10.5 K/uL   RBC 4.70 4.22 - 5.81 MIL/uL   Hemoglobin 14.6 13.0 - 17.0 g/dL   HCT 69.4 85.4 - 62.7 %   MCV 90.6 80.0 - 100.0 fL   MCH 31.1 26.0 - 34.0 pg   MCHC 34.3 30.0 - 36.0 g/dL   RDW 03.5 00.9 - 38.1 %   Platelets 220 150 - 400 K/uL   nRBC 0.0 0.0 - 0.2 %   Neutrophils Relative % 82 %   Neutro Abs 8.0 (H) 1.7 - 7.7 K/uL   Lymphocytes Relative 9 %   Lymphs Abs 0.9 0.7 - 4.0 K/uL  Monocytes Relative 7 %   Monocytes Absolute 0.7 0.1 - 1.0 K/uL   Eosinophils Relative 1 %   Eosinophils Absolute 0.1 0.0 - 0.5 K/uL   Basophils Relative 0 %   Basophils Absolute 0.0 0.0 - 0.1 K/uL   Immature Granulocytes 1 %   Abs Immature Granulocytes 0.08 (H) 0.00 - 0.07 K/uL    Comment: Performed at [facility] : [facility], [address] [place], [place] [address]  Comprehensive metabolic panel     Status: Abnormal   Collection Time: 09/27/19  1:16 AM  Result Value Ref Range   Sodium 126 (L) 135 - 145 mmol/L   Potassium 4.7 3.5 - 5.1 mmol/L   Chloride 91 (L) 98 - 111 mmol/L   CO2 17 (L) 22 - 32 mmol/L   Glucose, Bld 718 (HH) 70 - 99 mg/dL    Comment: Glucose reference range applies only to samples taken after fasting for at least 8 hours. CRITICAL RESULT CALLED TO, READ BACK BY AND VERIFIED WITH: Jarvis Newcomer RN [id number] M GARRETT    BUN 13 6 - 20 mg/dL   Creatinine, Ser 2.26  (H) 0.61 - 1.24 mg/dL   Calcium 9.2 8.9 - 33.3 mg/dL   Total Protein 7.5 6.5 - 8.1 g/dL   Albumin 3.3 (L) 3.5 - 5.0 g/dL   AST 19 15 - 41 U/L   ALT 16 0 - 44 U/L   Alkaline Phosphatase 96 38 - 126 U/L   Total Bilirubin 1.4 (H) 0.3 - 1.2 mg/dL   GFR calc non Af Amer >60 >60 mL/min   GFR calc Af Amer >60 >60 mL/min   Anion gap 18 (H) 5 - 15    Comment: Performed at [facility], [address] [place], [place] [address]  CBG monitoring, ED     Status: Abnormal   Collection Time: 09/27/19  4:24 AM  Result Value Ref Range   Glucose-Capillary 596 (HH) 70 - 99 mg/dL    Comment: Glucose reference range applies only to samples taken after fasting for at least 8 hours.   Comment 1 Notify RN    Comment 2 Document in Chart   CBG monitoring, ED     Status: Abnormal   Collection Time: 09/27/19  6:00 AM  Result Value Ref Range   Glucose-Capillary 361 (H) 70 - 99 mg/dL    Comment: Glucose reference range applies only to samples taken after fasting for at least 8 hours.  Basic metabolic panel     Status: Abnormal   Collection Time: 09/27/19  6:24 AM  Result Value Ref Range   Sodium 134 (L) 135 - 145 mmol/L   Potassium 3.8 3.5 - 5.1 mmol/L   Chloride 101 98 - 111 mmol/L   CO2 18 (L) 22 - 32 mmol/L   Glucose, Bld 360 (H) 70 - 99 mg/dL    Comment: Glucose reference range applies only to samples taken after fasting for at least 8 hours.   BUN 10 6 - 20 mg/dL   Creatinine, Ser 5.63 0.61 - 1.24 mg/dL   Calcium 8.6 (L) 8.9 - 10.3 mg/dL   GFR calc non Af Amer >60 >60 mL/min   GFR calc Af Amer >60 >60 mL/min   Anion gap 15 5 - 15    Comment: Performed at [facility], [address] [place], [place] [address]  Beta-hydroxybutyric acid     Status: Abnormal   Collection Time: 09/27/19  6:24 AM  Result Value  Ref Range   Beta-Hydroxybutyric Acid 2.51 (H) 0.05 - 0.27 mmol/L    Comment: Performed at [facility], [address], [place], [place] [address]  Hemoglobin A1c     Status:  Abnormal   Collection Time: 09/27/19  6:24 AM  Result Value Ref Range   Hgb A1c MFr Bld 13.3 (H) 4.8 - 5.6 %    Comment: (NOTE) Pre diabetes:          5.7%-6.4% Diabetes:              >6.4% Glycemic control for   <7.0% adults with diabetes    Mean Plasma Glucose 335.01 mg/dL    Comment: Performed at [facility], [address], [place], [place] [address]  Respiratory Panel by RT PCR (Flu A&B, Covid) - Nasopharyngeal Swab     Status: None   Collection Time: 09/27/19  6:29 AM   Specimen: Nasopharyngeal Swab  Result Value Ref Range   SARS Coronavirus 2 by RT PCR NEGATIVE NEGATIVE    Comment: (NOTE) SARS-CoV-2 target nucleic acids are NOT DETECTED. The SARS-CoV-2 RNA is generally detectable in upper respiratoy specimens during the acute phase of infection. The lowest concentration of SARS-CoV-2 viral copies this assay can detect is 131 copies/mL. A negative result does not preclude SARS-Cov-2 infection and should not be used as the sole basis for treatment or other patient management decisions. A negative result may occur with  improper specimen collection/handling, submission of specimen other than nasopharyngeal swab, presence of viral mutation(s) within the areas targeted by this assay, and inadequate number of viral copies (<131 copies/mL). A negative result must be combined with clinical observations, patient history, and epidemiological information. The expected result is Negative. Fact Sheet for Patients:  https://www.moore.com/ Fact Sheet for Healthcare Providers:  https://www.young.biz/ This test is not yet ap proved or cleared by the [place] FDA and  has been authorized for detection and/or diagnosis of SARS-CoV-2 by FDA under an Emergency Use Authorization (EUA). This EUA will remain  in effect (meaning this test can be used) for the duration of the COVID-19 declaration under Section 564(b)(1) of the Act, 21  U.S.C. section 360bbb-3(b)(1), unless the authorization is terminated or revoked sooner.    Influenza A by PCR NEGATIVE NEGATIVE   Influenza B by PCR NEGATIVE NEGATIVE    Comment: (NOTE) The Xpert Xpress SARS-CoV-2/FLU/RSV assay is intended as an aid in  the diagnosis of influenza from Nasopharyngeal swab specimens and  should not be used as a sole basis for treatment. Nasal washings and  aspirates are unacceptable for Xpert Xpress SARS-CoV-2/FLU/RSV  testing. Fact Sheet for Patients: https://www.moore.com/ Fact Sheet for Healthcare Providers: https://www.young.biz/ This test is not yet approved or cleared by the [place] FDA and  has been authorized for detection and/or diagnosis of SARS-CoV-2 by  FDA under an Emergency Use Authorization (EUA). This EUA will remain  in effect (meaning this test can be used) for the duration of the  Covid-19 declaration under Section 564(b)(1) of the Act, 21  U.S.C. section 360bbb-3(b)(1), unless the authorization is  terminated or revoked. Performed at [facility], [address] [place], [place] [address]   CBG monitoring, ED     Status: Abnormal   Collection Time: 09/27/19  7:56 AM  Result Value Ref Range   Glucose-Capillary 226 (H) 70 - 99 mg/dL    Comment: Glucose reference range applies only to samples taken after fasting for at least 8 hours.  CBG  monitoring, ED     Status: Abnormal   Collection Time: 09/27/19  9:56 AM  Result Value Ref Range   Glucose-Capillary 159 (H) 70 - 99 mg/dL    Comment: Glucose reference range applies only to samples taken after fasting for at least 8 hours.   Comment 1 Notify RN    Comment 2 Document in Chart     CT PELVIS W CONTRAST  Addendum Date: 09/27/2019   ADDENDUM REPORT: 09/27/2019 07:43 ADDENDUM: Findings called to the patient's provider in the ER, Hina Khatri. Electronically Signed   By: Dorise Bullion III M.D   On: 09/27/2019 07:43   Result  Date: 09/27/2019 CLINICAL DATA:  Abscess in upper thigh. Evaluate extension into perineum. EXAM: CT PELVIS WITH CONTRAST TECHNIQUE: Multidetector CT imaging of the pelvis was performed using the standard protocol following the bolus administration of intravenous contrast. CONTRAST:  147mL OMNIPAQUE IOHEXOL 300 MG/ML  SOLN COMPARISON:  None. FINDINGS: Urinary Tract:  No abnormality visualized. Bowel:  Unremarkable visualized pelvic bowel loops. Vascular/Lymphatic: No pathologically enlarged lymph nodes. No significant vascular abnormality seen. Reproductive:  No mass or other significant abnormality Other: There is air and fluid in the subcutaneous soft tissues with overlying skin thickening. The largest collection is seen in the upper medial thigh. There is extension into the right mons pubis with surrounding fat stranding. No definite involvement of the right spermatic cord. The fluid and air extends posteriorly into the right buttocks with overlying skin thickening. The fluid and air is not particularly well-defined and relatively diffuse throughout the soft tissues. There is significant surrounding fat stranding. There is overlying skin thickening. The fluid and air abuts the medial musculature of the upper thigh. There is not a clear fat plane between the fluid and air and the adjacent musculature. No bony erosion. Musculoskeletal: No suspicious bone lesions identified. IMPRESSION: 1. Fluid and air is seen in the right medial thigh extending into the right mons pubis without definite involvement of the spermatic cord. The fluid and air extends inferiorly and posteriorly into the right buttocks, abutting the intergluteal cleft. There is significant surrounding fat stranding and overlying skin thickening. The area involved is quite large extending from the anteromedial right thigh into the posterior right buttocks. No definite involvement of the scrotum. The fluid and air is not particularly well-defined. The  findings are consistent with an infectious process. There is not 1 discrete abscess. The findings are otherwise nonspecific. Necrotizing fasciitis cannot be excluded on CT imaging. Recommend clinical correlation. 2. No other abnormalities. Electronically Signed: By: Dorise Bullion III M.D On: 09/27/2019 06:59    ROS 10 point review of systems is negative except as listed above in HPI.   Physical Exam Blood pressure 137/81, pulse (!) 104, temperature (!) 97.5 F (36.4 C), temperature source Oral, resp. rate (!) 29, height 5\' 6"  (1.676 m), weight 90 kg, SpO2 98 %. Constitutional: well-developed, well-nourished HEENT: pupils equal, round, reactive to light, 38mm b/l, moist conjunctiva, external inspection of ears and nose normal, hearing intact Oropharynx: normal oropharyngeal mucosa, normal dentition Neck: no thyromegaly, trachea midlineno midline cervical tenderness to palpation Chest: breath sounds equal bilaterally, normal respiratory effort, no midline or lateral chest wall tenderness to palpation Abdomen: soft, NT, no bruising, no hepatosplenomegaly FAST: not performed Pelvis: stable GU: no blood at urethral meatus of penis, no scrotal masses or abnormality, tenderness of R perineum extending posteriorly to buttock on the R, evidence of previous SS drainage, but nothing able to be expressed at  the time of my exam Back: no wounds, no thoracic/lumbar spine tenderness to palpation Rectal: deferred Extremities: 2+ radial and pedal pulses bilaterally, motor and sensation intact to bilateral UE and LE, no peripheral edema MSK: normal gait/station, no clubbing/cyanosis of fingers/toes, normal ROM of all four extremities Skin: warm, dry, no rashes Psych: normal memory, normal mood/affect    Assessment/Plan: 73M with poorly controlled diabetes presents in DKA and with a soft tissue infection of the perineum.   Rec'd vanc/zosyn/clinda. To OR emergently for I&D. Discussed with the patient the  possibility of needing to return to the OR for repeat debridement. No current involvement of urologic structures. All questions answered to his satisfaction. Informed consent signed. Recommend admission to ICU for management of DKA by IMS.   Diamantina MonksAyesha N. Khalise Billard, MD General and Trauma Surgery [facility]

## 2019-09-28 DIAGNOSIS — Z978 Presence of other specified devices: Secondary | ICD-10-CM

## 2019-09-28 LAB — CBC WITH DIFFERENTIAL/PLATELET
Abs Immature Granulocytes: 0.24 10*3/uL — ABNORMAL HIGH (ref 0.00–0.07)
Basophils Absolute: 0.1 10*3/uL (ref 0.0–0.1)
Basophils Relative: 1 %
Eosinophils Absolute: 0.1 10*3/uL (ref 0.0–0.5)
Eosinophils Relative: 1 %
HCT: 35.9 % — ABNORMAL LOW (ref 39.0–52.0)
Hemoglobin: 11.9 g/dL — ABNORMAL LOW (ref 13.0–17.0)
Immature Granulocytes: 3 %
Lymphocytes Relative: 14 %
Lymphs Abs: 1.2 10*3/uL (ref 0.7–4.0)
MCH: 30.5 pg (ref 26.0–34.0)
MCHC: 33.1 g/dL (ref 30.0–36.0)
MCV: 92.1 fL (ref 80.0–100.0)
Monocytes Absolute: 0.3 10*3/uL (ref 0.1–1.0)
Monocytes Relative: 4 %
Neutro Abs: 6.5 10*3/uL (ref 1.7–7.7)
Neutrophils Relative %: 77 %
Platelets: 177 10*3/uL (ref 150–400)
RBC: 3.9 MIL/uL — ABNORMAL LOW (ref 4.22–5.81)
RDW: 12.8 % (ref 11.5–15.5)
WBC: 8.4 10*3/uL (ref 4.0–10.5)
nRBC: 0 % (ref 0.0–0.2)

## 2019-09-28 LAB — COMPREHENSIVE METABOLIC PANEL
ALT: 11 U/L (ref 0–44)
AST: 14 U/L — ABNORMAL LOW (ref 15–41)
Albumin: 2.5 g/dL — ABNORMAL LOW (ref 3.5–5.0)
Alkaline Phosphatase: 73 U/L (ref 38–126)
Anion gap: 13 (ref 5–15)
BUN: <5 mg/dL — ABNORMAL LOW (ref 6–20)
CO2: 22 mmol/L (ref 22–32)
Calcium: 8.2 mg/dL — ABNORMAL LOW (ref 8.9–10.3)
Chloride: 101 mmol/L (ref 98–111)
Creatinine, Ser: 0.76 mg/dL (ref 0.61–1.24)
GFR calc Af Amer: >60 mL/min (ref >60)
GFR calc non Af Amer: >60 mL/min (ref >60)
Glucose, Bld: 212 mg/dL — ABNORMAL HIGH (ref 70–99)
Potassium: 4 mmol/L (ref 3.5–5.1)
Sodium: 136 mmol/L (ref 135–145)
Total Bilirubin: 1 mg/dL (ref 0.3–1.2)
Total Protein: 6.4 g/dL — ABNORMAL LOW (ref 6.5–8.1)

## 2019-09-28 LAB — GLUCOSE, CAPILLARY
Glucose-Capillary: 147 mg/dL — ABNORMAL HIGH (ref 70–99)
Glucose-Capillary: 234 mg/dL — ABNORMAL HIGH (ref 70–99)
Glucose-Capillary: 219 mg/dL — ABNORMAL HIGH (ref 70–99)
Glucose-Capillary: 140 mg/dL — ABNORMAL HIGH (ref 70–99)

## 2019-09-28 LAB — HIV ANTIBODY (ROUTINE TESTING W REFLEX): HIV Screen 4th Generation wRfx: NONREACTIVE

## 2019-09-28 MED ORDER — INSULIN ASPART 100 UNIT/ML ~~LOC~~ SOLN
0.0000 [IU] | Freq: Every day | SUBCUTANEOUS | Status: DC
  Administered 2019-09-28: 2 [IU] via SUBCUTANEOUS
  Administered 2019-09-29 – 2019-10-02 (×2): 3 [IU] via SUBCUTANEOUS

## 2019-09-28 MED ORDER — LACTATED RINGERS IV SOLN: INTRAVENOUS | Status: AC

## 2019-09-28 MED ORDER — INSULIN ASPART 100 UNIT/ML ~~LOC~~ SOLN
0.0000 [IU] | Freq: Three times a day (TID) | SUBCUTANEOUS | Status: DC
  Administered 2019-09-28: 3 [IU] via SUBCUTANEOUS
  Administered 2019-09-28: 7 [IU] via SUBCUTANEOUS
  Administered 2019-09-29: 4 [IU] via SUBCUTANEOUS
  Administered 2019-09-29: 06:00:00 7 [IU] via SUBCUTANEOUS
  Administered 2019-09-30 (×2): 11 [IU] via SUBCUTANEOUS
  Administered 2019-10-01: 4 [IU] via SUBCUTANEOUS
  Administered 2019-10-01: 08:00:00 3 [IU] via SUBCUTANEOUS
  Administered 2019-10-02: 4 [IU] via SUBCUTANEOUS
  Administered 2019-10-02: 11 [IU] via SUBCUTANEOUS
  Administered 2019-10-03: 7 [IU] via SUBCUTANEOUS
  Administered 2019-10-03 (×2): 4 [IU] via SUBCUTANEOUS
  Administered 2019-10-04: 07:00:00 7 [IU] via SUBCUTANEOUS

## 2019-09-28 MED ORDER — ACETAMINOPHEN 325 MG PO TABS
650.0000 mg | ORAL_TABLET | Freq: Four times a day (QID) | ORAL | Status: DC | PRN
  Administered 2019-09-28 – 2019-10-02 (×5): 650 mg via ORAL
  Filled 2019-09-28 (×5): qty 2

## 2019-09-28 MED ORDER — INSULIN ASPART 100 UNIT/ML ~~LOC~~ SOLN: 5.0000 [IU] | Freq: Three times a day (TID) | SUBCUTANEOUS | Status: DC

## 2019-09-28 MED ORDER — INSULIN STARTER KIT- PEN NEEDLES (ENGLISH)
1.0000 | Freq: Once | Status: AC
  Administered 2019-09-28: 1
  Filled 2019-09-28: qty 1

## 2019-09-28 MED ORDER — INSULIN ASPART 100 UNIT/ML ~~LOC~~ SOLN
5.0000 [IU] | Freq: Three times a day (TID) | SUBCUTANEOUS | Status: DC
  Administered 2019-09-28: 5 [IU] via SUBCUTANEOUS

## 2019-09-28 MED ORDER — INSULIN GLARGINE 100 UNIT/ML ~~LOC~~ SOLN
20.0000 [IU] | Freq: Every day | SUBCUTANEOUS | Status: DC
  Administered 2019-09-28: 20 [IU] via SUBCUTANEOUS
  Filled 2019-09-28 (×2): qty 0.2

## 2019-09-28 MED ORDER — INSULIN ASPART 100 UNIT/ML ~~LOC~~ SOLN: 7.0000 [IU] | Freq: Three times a day (TID) | SUBCUTANEOUS | Status: DC

## 2019-09-28 MED ORDER — OXYCODONE HCL 5 MG PO TABS
5.0000 mg | ORAL_TABLET | ORAL | Status: DC | PRN
  Administered 2019-09-28 – 2019-09-30 (×7): 10 mg via ORAL
  Administered 2019-10-01: 5 mg via ORAL
  Administered 2019-10-01 – 2019-10-04 (×14): 10 mg via ORAL
  Filled 2019-09-28 (×16): qty 2
  Filled 2019-09-28: qty 1
  Filled 2019-09-28 (×5): qty 2

## 2019-09-28 MED ORDER — INSULIN ASPART 100 UNIT/ML ~~LOC~~ SOLN
7.0000 [IU] | Freq: Three times a day (TID) | SUBCUTANEOUS | Status: DC
  Administered 2019-09-28 (×2): 7 [IU] via SUBCUTANEOUS

## 2019-09-28 MED ORDER — SODIUM CHLORIDE 0.9 % IV BOLUS
1000.0000 mL | Freq: Once | INTRAVENOUS | Status: AC
  Administered 2019-09-28: 1000 mL via INTRAVENOUS

## 2019-09-28 NOTE — Evaluation (Signed)
Physical Therapy Evaluation Patient Details Name: Logan Weiss MRN: [id number] DOB: [birth year] Today's Date: 09/28/2019   History of Present Illness  This 49 y.o. male admitted with Rt buttock abcess.  He underwent I&D on 09/27/2019.  PMH includes: uncontrolled DM, h/o MRSA infection   Clinical Impression  Pt is a 49 yo male admitted for above. Pt reported mild pain at rest that increases with mobility. Pt reports living in a one level home with 2 STE with his wife. His wife works from home 12-9 and is limited in amount of assistance she can provide during those hours. 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 Pt required min guard for all functional mobility. Pt utilizing heavy use of bed rails and UEs to get EOB secondary to pain that increases with mobility. Pt ambulated within room without AD and no overt LOB although mildly unsteady suspect due to gait pattern changes due to pain. Pt ambulated with dec stride length and clearance, wider base of support and slow speed. Pt able to ambulate with vertical and horizontal head turns and step over obstacle without LOB. Pt guarded especially on RLE where pain is greater. Pt able to maintain static standing balance without UE support in narrow BOS for 30 sec eyes open, 20sec eyes closed with mildly increased sway, and semi tandem stance for 20 sec bil. Pt presents with LLE strength grossly 4/5 with RLE not tested secondary to pain. Pt currently presents with dec strength and ROM on RLE, mildly dec balance, dec activity tolerance and pain limiting functional mobility. PT will follow acutely to continue progressing functional mobility and suspect pt will progress well as incision heals and pain controlled.     Follow Up Recommendations No PT follow up    Equipment Recommendations  None recommended by PT    Recommendations for Other Services       Precautions / Restrictions  Precautions Precautions: Fall Precaution Comments: mildly unsteady  Restrictions Weight Bearing Restrictions: No      Mobility  Bed Mobility Overal bed mobility: Needs Assistance Bed Mobility: Supine to Sit;Sit to Supine Rolling: Supervision   Supine to sit: HOB elevated;Min guard Sit to supine: Min guard;HOB elevated   General bed mobility comments: min guard for safety, no physical assist required, pt utilizes increased time, bed rails and effort secondary to pain, pt mostly reliant on UEs and bed rails to swing around to EOB due to pain  Transfers Overall transfer level: Needs assistance Equipment used: None Transfers: Sit to/from Stand Sit to Stand: Min guard Stand pivot transfers: Min guard       General transfer comment: min guard for safety, pt steady, increased trunk flexion secondary to pain  Ambulation/Gait Ambulation/Gait assistance: Min guard Gait Distance (Feet): 50 Feet Assistive device: None Gait Pattern/deviations: Decreased stride length;Step-through pattern;Wide base of support Gait velocity: dec   General Gait Details: pt ambulated back and forth within room (he did not want to go into hall this date), mildly unsteady however no overt LOB, no AD used and min guard for safety, pt ambulates with dec gait speed, increased trunk flexion with very short stride length secondary to pain, pt reports typically he is a fast walker, pt able to ambulate with vertical and horizontal head turns and step over obstacle without LOB  Stairs            Wheelchair Mobility    Modified Rankin (Stroke Patients Only)  Balance Overall balance assessment: Mild deficits observed, not formally tested(pt able to maintain static standing balance without UE support in narrow BOS 30 sec eyes open, 20 sec eyes closed (mildly inc sway with EC), tandem stance for 15 sec Bil with mildly increased sway)                                            Pertinent Vitals/Pain Pain Assessment: 0-10 Pain Score: 6  Pain Location: Rt buttock  Pain Descriptors / Indicators: Operative site guarding;Grimacing;Guarding Pain Intervention(s): Limited activity within patient's tolerance;Monitored during session;Repositioned    Home Living Family/patient expects to be discharged to:: Private residence Living Arrangements: Spouse/significant other Available Help at Discharge: Family;Available PRN/intermittently(wife works from home) Type of Home: House Home Access: Stairs to enter Entrance Stairs-Rails: None Technical brewer of Steps: 2 Home Layout: One level Home Equipment: None Additional Comments: wife works from home from 12-9 with 30 min lunch break     Prior Function Level of Independence: Independent         Comments: Pt works full time setting up [employer] - mostly works remotely due to pandemic and 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  He reports he spontaneously lost function of his hand ~6 years ago while at work, and that it gradually improved.  He reports he has seen two specialists re: his hand.  LUE Coordination: decreased fine motor    Lower Extremity Assessment Lower Extremity Assessment: Overall WFL for tasks assessed;RLE deficits/detail(LLE strength grossly 4/5) RLE: Unable to fully assess due to pain    Cervical / Trunk Assessment Cervical / Trunk Assessment: Other exceptions Cervical / Trunk Exceptions: Pt flexes at hips while standing/ambulating due to pain   Communication   Communication: No difficulties  Cognition Arousal/Alertness:  Awake/alert Behavior During Therapy: WFL for tasks assessed/performed Overall Cognitive Status: Within Functional Limits for tasks assessed                                        General Comments General comments (skin integrity, edema, etc.): HR to 130 in standing, mostly low to mid 120s during ambulation    Exercises Other Exercises Other Exercises: pt educated on seated and sitting LE therex to perform on his own to maintain strength, encouraged to get up multiple times during the day   Assessment/Plan    PT Assessment Patient needs continued PT services  PT Problem List Decreased strength;Decreased mobility;Decreased range of motion;Decreased activity tolerance;Decreased balance;Pain;Cardiopulmonary status limiting activity       PT Treatment Interventions DME instruction;Therapeutic exercise;Gait training;Balance training;Stair training;Neuromuscular re-education;Functional mobility training;Therapeutic activities;Patient/family education    PT Goals (Current goals can be found in the Care Plan section)  Acute Rehab PT Goals Patient Stated Goal: get back to work PT Goal Formulation: With patient Time For Goal Achievement: 10/12/19 Potential to Achieve Goals: Good    Frequency Min 3X/week   Barriers to  discharge Decreased caregiver support(wife works from 12-9 and limited availability for assistance)      Co-evaluation               AM-PAC PT "6 Clicks" Mobility  Outcome Measure Help needed turning from your back to your side while in a flat bed without using bedrails?: A Little Help needed moving from lying on your back to sitting on the side of a flat bed without using bedrails?: A Little Help needed moving to and from a bed to a chair (including a wheelchair)?: A Little Help needed standing up from a chair using your arms (e.g., wheelchair or bedside chair)?: A Little Help needed to walk in hospital room?: A Little Help needed climbing 3-5  steps with a railing? : A Little 6 Click Score: 18    End of Session Equipment Utilized During Treatment: Gait belt Activity Tolerance: Patient tolerated treatment well;Patient limited by pain Patient left: in bed;with call bell/phone within reach Nurse Communication: Mobility status PT Visit Diagnosis: Unsteadiness on feet (R26.81);Difficulty in walking, not elsewhere classified (R26.2)    Time: 1791-5056 PT Time Calculation (min) (ACUTE ONLY): 22 min   Charges:   PT Evaluation $PT Eval Moderate Complexity: 1 Mod          Gaige Sebo PT, DPT 2:31 PM,09/28/19   Maxine Huynh Shyrl Numbers 09/28/2019, 2:23 PM

## 2019-09-28 NOTE — Progress Notes (Signed)
Temp: 101.9 HR: 123 BP: 99/74 RR: 22 O2: 97%RA  MEWS in red.  PRN tylenol given. MDs notified, awaiting further orders, will continue to monitor.

## 2019-09-28 NOTE — Progress Notes (Signed)
Inpatient Diabetes Program Recommendations  AACE/ADA: New Consensus Statement on Inpatient Glycemic Control (2015)  Target Ranges:  Prepandial:   less than 140 mg/dL      Peak postprandial:   less than 180 mg/dL (1-2 hours)      Critically ill patients:  140 - 180 mg/dL   Lab Results  Component Value Date   GLUCAP 219 (H) 09/28/2019   HGBA1C 13.3 (H) 09/27/2019    Review of Glycemic Control Results for Logan Weiss, Logan Weiss (MRN [id number]) as of 09/28/2019 14:23  Ref. Range 09/27/2019 17:41 09/27/2019 21:24 09/28/2019 06:05 09/28/2019 12:33  Glucose-Capillary Latest Ref Range: 70 - 99 mg/dL 167 (H) 206 (H) 234 (H) 219 (H)   Diabetes history: Type 2 DM Outpatient Diabetes medications: Amaryl 4 mg QD, Actos 30 mg QD Current orders for Inpatient glycemic control: Lantus 20 units QHS, Novolog 5 units TID, Novolog 0-20 units TID, Novolog 0-5 units QHS  Inpatient Diabetes Program Recommendations:    Spoke with patient regarding outpatient diabetes management. Patient states he has recently made lifestyle changes that include: quitting smoking, eliminating sugary beverages and walking 3-5 miles per day.  A1C was >17% 3 months ago.  Reviewed patient's current A1c of 13.3% . Explained what a A1c is and what it measures. Also reviewed goal A1c with patient, importance of good glucose control @ home, and blood sugar goals. Reviewed patho of DM, need for insulin, role of pancreas, vascular changes, increased risk for infection with poor glycemic control and other commorbidities. Patient has meter and other supplies. Reports checking 2 times per day and that blood sugars have ranged from 200-300 mg/dL.  Educated patient and spouse on insulin pen use at home. Reviewed contents of insulin flexpen starter kit. Reviewed all steps if insulin pen including attachment of needle, 2-unit air shot, dialing up dose, giving injection, removing needle, disposal of sharps, storage of unused insulin, disposal of insulin etc.  Patient able to provide successful return demonstration. Also reviewed troubleshooting with insulin pen. MD to give patient Rxs for insulin pens and insulin pen needles. Levemir flex pen $30 for 30 day supply (#[id number]) Insulin pen needles (#[id number]) Dietitian consult, [facility] consult and insulin starter kit ordered.  Thanks, Bronson Curb, MSN, RNC-OB Diabetes Coordinator [phone] (8a-5p)

## 2019-09-28 NOTE — Progress Notes (Signed)
1 Day Post-Op  Subjective: CC: Reports some pain over the area of the I&D site. Tolerated dressing change yesterday. Tolerating diet. Voiding without difficulty. Mobilizing.   Objective: Vital signs in last 24 hours: Temp:  [97.7 F (36.5 C)-101.4 F (38.6 C)] 98.8 F (37.1 C) (03/22 0900) Pulse Rate:  [87-120] 87 (03/22 0359) Resp:  [16-29] 18 (03/22 0558) BP: (94-123)/(56-77) 108/73 (03/22 0558) SpO2:  [91 %-97 %] 94 % (03/22 0558) Weight:  [84.1 kg-90 kg] 84.1 kg (03/22 0554)    Intake/Output from previous day: 03/21 0701 - 03/22 0700 In: 5059.8 [P.O.:240; I.V.:1379.7; IV Piggyback:3440.2] Out: 970 [Urine:950; Blood:20] Intake/Output this shift: No intake/output data recorded.  PE: Gen: Awake and alert, NAD Lungs: Normal rate and effort GU: Chaperone RN present, there is incision extending from the right perineum to the medial right thigh with penrose x 2 in place. Wound with purulent drainage. There is a noted area of edema, induration and warmth just superior to the proximal aspect of the incision. Appropriately tender to palpation.   Lab Results:  Recent Labs    09/27/19 0116  WBC 9.8  HGB 14.6  HCT 42.6  PLT 220   BMET Recent Labs    09/27/19 2134 09/28/19 0305  NA 135 136  K 3.5 4.0  CL 102 101  CO2 21* 22  GLUCOSE 233* 212*  BUN <5* <5*  CREATININE 0.81 0.76  CALCIUM 8.0* 8.2*   PT/INR No results for input(s): LABPROT, INR in the last 72 hours. CMP     Component Value Date/Time   NA 136 09/28/2019 0305   K 4.0 09/28/2019 0305   CL 101 09/28/2019 0305   CO2 22 09/28/2019 0305   GLUCOSE 212 (H) 09/28/2019 0305   BUN <5 (L) 09/28/2019 0305   CREATININE 0.76 09/28/2019 0305   CREATININE 0.78 06/05/2014 1155   CALCIUM 8.2 (L) 09/28/2019 0305   PROT 6.4 (L) 09/28/2019 0305   ALBUMIN 2.5 (L) 09/28/2019 0305   AST 14 (L) 09/28/2019 0305   ALT 11 09/28/2019 0305   ALKPHOS 73 09/28/2019 0305   BILITOT 1.0 09/28/2019 0305   GFRNONAA >60  09/28/2019 0305   GFRNONAA >89 06/05/2014 1155   GFRAA >60 09/28/2019 0305   GFRAA >89 06/05/2014 1155   Lipase     Component Value Date/Time   LIPASE 39 04/13/2018 0831       Studies/Results: CT PELVIS W CONTRAST  Addendum Date: 09/27/2019   ADDENDUM REPORT: 09/27/2019 07:43 ADDENDUM: Findings called to the patient's provider in the ER, Hina Khatri. Electronically Signed   By: Dorise Bullion III M.D   On: 09/27/2019 07:43   Result Date: 09/27/2019 CLINICAL DATA:  Abscess in upper thigh. Evaluate extension into perineum. EXAM: CT PELVIS WITH CONTRAST TECHNIQUE: Multidetector CT imaging of the pelvis was performed using the standard protocol following the bolus administration of intravenous contrast. CONTRAST:  163mL OMNIPAQUE IOHEXOL 300 MG/ML  SOLN COMPARISON:  None. FINDINGS: Urinary Tract:  No abnormality visualized. Bowel:  Unremarkable visualized pelvic bowel loops. Vascular/Lymphatic: No pathologically enlarged lymph nodes. No significant vascular abnormality seen. Reproductive:  No mass or other significant abnormality Other: There is air and fluid in the subcutaneous soft tissues with overlying skin thickening. The largest collection is seen in the upper medial thigh. There is extension into the right mons pubis with surrounding fat stranding. No definite involvement of the right spermatic cord. The fluid and air extends posteriorly into the right buttocks with overlying skin thickening. The  fluid and air is not particularly well-defined and relatively diffuse throughout the soft tissues. There is significant surrounding fat stranding. There is overlying skin thickening. The fluid and air abuts the medial musculature of the upper thigh. There is not a clear fat plane between the fluid and air and the adjacent musculature. No bony erosion. Musculoskeletal: No suspicious bone lesions identified. IMPRESSION: 1. Fluid and air is seen in the right medial thigh extending into the right mons  pubis without definite involvement of the spermatic cord. The fluid and air extends inferiorly and posteriorly into the right buttocks, abutting the intergluteal cleft. There is significant surrounding fat stranding and overlying skin thickening. The area involved is quite large extending from the anteromedial right thigh into the posterior right buttocks. No definite involvement of the scrotum. The fluid and air is not particularly well-defined. The findings are consistent with an infectious process. There is not 1 discrete abscess. The findings are otherwise nonspecific. Necrotizing fasciitis cannot be excluded on CT imaging. Recommend clinical correlation. 2. No other abnormalities. Electronically Signed: By: Gerome Sam III M.D On: 09/27/2019 06:59    Anti-infectives: Anti-infectives (From admission, onward)   Start     Dose/Rate Route Frequency Ordered Stop   09/27/19 2030  meropenem (MERREM) 1 g in sodium chloride 0.9 % 100 mL IVPB     1 g 200 mL/hr over 30 Minutes Intravenous Every 8 hours 09/27/19 1938     09/27/19 2030  vancomycin (VANCOCIN) IVPB 1000 mg/200 mL premix     1,000 mg 200 mL/hr over 60 Minutes Intravenous Every 12 hours 09/27/19 1938     09/27/19 2015  clindamycin (CLEOCIN) IVPB 600 mg     600 mg 100 mL/hr over 30 Minutes Intravenous Every 8 hours 09/27/19 1913     09/27/19 0600  vancomycin (VANCOREADY) IVPB 1750 mg/350 mL     1,750 mg 175 mL/hr over 120 Minutes Intravenous  Once 09/27/19 0553 09/27/19 0823   09/27/19 0600  piperacillin-tazobactam (ZOSYN) IVPB 3.375 g     3.375 g 100 mL/hr over 30 Minutes Intravenous  Once 09/27/19 0553 09/27/19 0621   09/27/19 0600  clindamycin (CLEOCIN) IVPB 900 mg     900 mg 100 mL/hr over 30 Minutes Intravenous  Once 09/27/19 0558 09/27/19 6568       Assessment/Plan DM2 w/ DKA on presentation   R perineal and thigh abscess with soft tissue infection - s/p irrigation and debridement of R perineum and thigh - Dr. Bedelia Person -  3/21 - Monitor wound. Dressing changes.  - Cont abx. Await cx's - Mobilize   FEN - CM diet  VTE - SCDs, okay for chemical prophylaxis from a general surgery standpoint ID - Clinda/Merrem/Vanc 3/21 >> Foley - None  Follow-Up - TBD    LOS: 1 day    Logan Weiss , [facility] 09/28/2019, 10:35 AM Please see Amion for pager number during day hours 7:00am-4:30pm

## 2019-09-28 NOTE — Plan of Care (Signed)
  Problem: Health Behavior/Discharge Planning: Goal: Ability to manage health-related needs will improve Outcome: Progressing   Problem: Clinical Measurements: Goal: Respiratory complications will improve Outcome: Progressing Goal: Cardiovascular complication will be avoided Outcome: Progressing   

## 2019-09-28 NOTE — Progress Notes (Signed)
   Subjective:   Pt seen at the bedside this morning. Having a little bit of pain today but overall feels well. Had a fever to 101.5 overnight, treated with tylenol. Discussed with pt and wife this morning that pt's high very A1c and uncontrolled sugars was likely the nidus for his current infection. Instructed them on basic diabetes management and general recommendation for pt to begin at least long-acting insulin at discharge. All questions and concerns addressed.   Objective:  Vital signs in last 24 hours: Vitals:   09/27/19 2300 09/28/19 0359 09/28/19 0554 09/28/19 0558  BP: 94/61 110/71  108/73  Pulse: 94 87    Resp: 19 (!) 22  18  Temp: 98.4 F (36.9 C) 97.7 F (36.5 C)  98.4 F (36.9 C)  TempSrc:  Axillary  Oral  SpO2: 93% 97%  94%  Weight:   84.1 kg   Height:       Physical Exam Vitals and nursing note reviewed.  Constitutional:      General: He is not in acute distress.    Appearance: He is not ill-appearing.  Cardiovascular:     Rate and Rhythm: Regular rhythm. Tachycardia present.  Pulmonary:     Effort: Pulmonary effort is normal.  Skin:    General: Skin is warm and dry.     Comments: Pt with active purulent drainage around surgical penrose and surrounding induration. Drainage had soaked through overlaying dressing.  Neurological:     Mental Status: He is alert.    Assessment/Plan:  Active Problems:   Abscess   Abscess of multiple sites of perineum  Logan Weiss is a 49 year old M with significant PMH of uncontrolled type II diabetes who presents with a R perineal abscess and soft tissue infection with subsequent hyperglycemia.  R perineal abscess and soft tissue infection Pt with 5-7 day history of R groin pain. CT imaging of the pelvis revealed large area from the anteromedial R thigh to the posterior buttock with fluid and air and without discrete abscess consistent with an infectious process, nec fasc could not be excluded. - day 2 of  vanc/merrem/clinda - spiked fever to 101.4 yesterday evening - still without leukocytosis  - remains tachycardic with HR in the 110's and BP soft with systolic 90-100  General surgery consulted, appreciate their input in this case - s/p I&D on 3/21 - surgical cultures growing few staph aureus - will follow for sensitives  - monitor wound and dressing changes - mobilize   DKA - resolved Hyperglycemia and DKA likely secondary to infectious process in the setting of uncontrolled type II diabetes. Uncontrolled type II DM A1c 13.3 on admission. Per chart review, pt on pioglitazone 30mg  daily and glimepiride 4mg  daily. Pt unable to avoid SGLT-2 prescription earlier this month and would recommend avoiding this class in the future given risk of Fournier's gangrene. - pt off Endotool and transitioned to subQ insulin yesterday evening - BMP this morning with Glc 233, bicarb 21, and anion gap of 12 - CBGs still elevated, likely in the setting of persistent infection - on lantus 20u daily, novolog 7u TID with meals, and resistent + qhs SSI - diabetes coordinator consulted - would recommend pt start at least long acting insulin on discharge  Prior to Admission Living Arrangement: home Anticipated Discharge Location: home Barriers to Discharge: clinical improvement, potential additional I&Ds Dispo: Anticipated discharge in approximately 2-3 day(s).   , MD 09/28/2019, 7:14 AM Pager: [phone]

## 2019-09-28 NOTE — Evaluation (Signed)
Occupational Therapy Evaluation Patient Details Name: Logan Weiss MRN: [id number] DOB: 29-Nov-1970 Today's Date: 09/28/2019    History of Present Illness This 49 y.o. male admitted with Rt buttock abcess.  He underwent I&D on 09/27/2019.  PMH includes: uncontrolled DM, h/o MRSA infection    Clinical Impression   Pt admitted with above. He demonstrates the below listed deficits and will benefit from continued OT to maximize safety and independence with BADLs.  Pt limited by pain this date.  He is very guarded with movement and is unable to maintain sitting position, or shift weight onto Rt buttock.  He currently is unable to access bil. Feet for LB ADLs, due to pain, but is able to ambulate in room with min guard assist.  He lives with his wife, who works from home, but will have limited availability to assist him between 12-9 due to work responsibilities, so he will need to be fairly independent.  Anticipate good progress as his pain improves.  He may need 3in1 commode at discharge as sitting on low surface of regular toilet may be difficult due to pain.       Follow Up Recommendations  No OT follow up;Supervision - Intermittent    Equipment Recommendations  3 in 1 bedside commode(depending on pain and progress )    Recommendations for Other Services       Precautions / Restrictions Precautions Precautions: Fall Precaution Comments: mildly unsteady       Mobility Bed Mobility Overal bed mobility: Needs Assistance Bed Mobility: Supine to Sit;Sit to Supine;Rolling Rolling: Supervision   Supine to sit: Min guard;HOB elevated Sit to supine: Min guard;HOB elevated   General bed mobility comments: Pt moves slowly and is guarded due to pain.  He is unable to sit or shift weight onto Rt hip   Transfers Overall transfer level: Needs assistance Equipment used: None Transfers: Sit to/from [employer] Sit to Stand: Min guard Stand pivot transfers: Min guard             Balance Overall balance assessment: Mild deficits observed, not formally tested                                         ADL either performed or assessed with clinical judgement   ADL Overall ADL's : Needs assistance/impaired Eating/Feeding: Independent   Grooming: Wash/dry hands;Wash/dry face;Oral care;Brushing hair;Supervision/safety;Standing   Upper Body Bathing: Set up;Sitting   Lower Body Bathing: Moderate assistance;Sit to/from stand   Upper Body Dressing : Minimal assistance;Sitting   Lower Body Dressing: Maximal assistance;Sit to/from stand Lower Body Dressing Details (indicate cue type and reason): in supine, pt unable to perform figure 4 due to pain  Toilet Transfer: Min guard;Ambulation;Comfort height toilet;RW   Toileting- Clothing Manipulation and Hygiene: Minimal assistance;Sit to/from stand       Functional mobility during ADLs: Min guard General ADL Comments: Pt limited by pain      Vision         Perception     Praxis      Pertinent Vitals/Pain Pain Assessment: 0-10 Pain Score: 6  Pain Location: Rt buttock  Pain Descriptors / Indicators: Operative site guarding;Grimacing;Guarding Pain Intervention(s): Monitored during session;Premedicated before session;Repositioned     Hand Dominance Right   Extremity/Trunk Assessment Upper Extremity Assessment Upper Extremity Assessment: LUE deficits/detail LUE Deficits / Details: Pt with clawing of Lt hand with 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  He reports he spontaneously lost function of his hand ~6 years ago while at work, and that it gradually improved.  He reports he has seen two specialists re: his hand.  LUE Coordination: decreased fine motor   Lower Extremity Assessment Lower Extremity Assessment: Defer to PT evaluation   Cervical / Trunk Assessment Cervical / Trunk Assessment: Other exceptions Cervical / Trunk  Exceptions: Pt flexes at hips while standing/ambulating due to pain    Communication Communication Communication: No difficulties   Cognition Arousal/Alertness: Awake/alert Behavior During Therapy: WFL for tasks assessed/performed Overall Cognitive Status: Within Functional Limits for tasks assessed                                     General Comments  HR to 133     Exercises     Shoulder Instructions      Home Living Family/patient expects to be discharged to:: Private residence Living Arrangements: Spouse/significant other Available Help at Discharge: Family;Available PRN/intermittently Type of Home: House Home Access: Stairs to enter Entrance Stairs-Number of Steps: 2 Entrance Stairs-Rails: None(has a rail but it is unstable ) Home Layout: One level     Bathroom Shower/Tub: Chief Strategy Officer: Standard     Home Equipment: None   Additional Comments: wife works from home from 12-9 with 30 min lunch break       Prior Functioning/Environment Level of Independence: Independent        Comments: Pt works full time setting up [employer] - mostly works remotely due to pandemic and he is a Surveyor, 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         OT Problem List: Decreased activity tolerance;Impaired balance (sitting and/or standing);Pain;Decreased safety awareness      OT Treatment/Interventions: Self-care/ADL training;DME and/or AE instruction;Therapeutic activities;Patient/family education;Balance training    OT Goals(Current goals can be found in the care plan section) Acute Rehab OT Goals Patient Stated Goal: to be able to work  OT Goal Formulation: With patient Time For Goal Achievement: 10/12/19 Potential to Achieve Goals: Good ADL Goals Pt Will Perform Grooming: with modified independence;standing Pt Will Perform Lower Body Bathing: with modified independence;sit to/from stand;with adaptive equipment Pt Will  Perform Lower Body Dressing: with modified independence;sit to/from stand;with adaptive equipment Pt Will Transfer to Toilet: with modified independence;ambulating;regular height toilet;bedside commode;grab bars Pt Will Perform Toileting - Clothing Manipulation and hygiene: with modified independence;sit to/from stand Pt Will Perform Tub/Shower Transfer: Tub transfer;with supervision;ambulating  OT Frequency: Min 2X/week   Barriers to D/C: Decreased caregiver support  wife with limited availability from 12-9       Co-evaluation              AM-PAC OT "6 Clicks" Daily Activity     Outcome Measure Help from another person eating meals?: None Help from another person taking care of personal grooming?: A Little Help from another person toileting, which includes using toliet, bedpan, or urinal?: A Little Help from another person bathing (including washing, rinsing, drying)?: A Lot Help from another person to put on and taking off regular upper body clothing?: A Little Help from another person to put on and taking off regular lower body clothing?: A Lot 6 Click Score: 17   End of Session Nurse Communication: Mobility status  Activity Tolerance: Patient limited  by pain Patient left: in bed;with call bell/phone within reach  OT Visit Diagnosis: Pain Pain - Right/Left: Right Pain - part of body: Hip(buttock )                Time: 1240-1310 OT Time Calculation (min): 30 min Charges:  OT General Charges $OT Visit: 1 Visit OT Evaluation $OT Eval Moderate Complexity: 1 Mod OT Treatments $Therapeutic Activity: 8-22 mins  Nilsa Nutting., OTR/L Acute Rehabilitation Services Pager [phone] Office [phone]   Lucille Passy M 09/28/2019, 1:46 PM

## 2019-09-28 NOTE — Consult Note (Signed)
   [facility] Inpatient Consult   09/28/2019  Logan Weiss 10-16-1970 [id number]   [facility] referral received for uncontrolled diabetes for chronic diease management and support. Patient with [employer] health insurance. Primary Care Provider: Synetta Fail, MD with [facility] noted.   Plan:  Will follow for progress and needs.  [name], RN BSN CCM [facility]  [phone] business mobile phone Toll free office [phone]  Fax number: [phone] [name]

## 2019-09-28 NOTE — Progress Notes (Signed)
OT Cancellation Note  Patient Details Name: Logan Weiss MRN: [id number] DOB: 03-06-1971   Cancelled Treatment:    Reason Eval/Treat Not Completed: Pain limiting ability to participate.  Pt reports pain 9-10/10, and just received pain meds.  Will check back once pain meds have had a chance to act,  to reattempt OT eval.   Eber Jones., OTR/L Acute Rehabilitation Services Pager [phone] Office [phone]    Jeani Hawking M 09/28/2019, 11:06 AM

## 2019-09-29 ENCOUNTER — Encounter (HOSPITAL_COMMUNITY)
Admission: EM | Disposition: A | Discharge status: Home / Self Care | Payer: Self-pay | Source: Home / Self Care | Attending: Internal Medicine

## 2019-09-29 ENCOUNTER — Inpatient Hospital Stay (HOSPITAL_COMMUNITY): Payer: 59 | Admitting: Certified Registered Nurse Anesthetist

## 2019-09-29 ENCOUNTER — Encounter (HOSPITAL_COMMUNITY): Payer: Self-pay | Admitting: Internal Medicine

## 2019-09-29 DIAGNOSIS — E118 Type 2 diabetes mellitus with unspecified complications: Secondary | ICD-10-CM

## 2019-09-29 HISTORY — PX: IRRIGATION AND DEBRIDEMENT ABSCESS: SHX5252

## 2019-09-29 LAB — COMPREHENSIVE METABOLIC PANEL
ALT: 11 U/L (ref 0–44)
AST: 15 U/L (ref 15–41)
Albumin: 2.1 g/dL — ABNORMAL LOW (ref 3.5–5.0)
Alkaline Phosphatase: 75 U/L (ref 38–126)
Anion gap: 12 (ref 5–15)
BUN: <5 mg/dL — ABNORMAL LOW (ref 6–20)
CO2: 22 mmol/L (ref 22–32)
Calcium: 7.7 mg/dL — ABNORMAL LOW (ref 8.9–10.3)
Chloride: 102 mmol/L (ref 98–111)
Creatinine, Ser: 0.74 mg/dL (ref 0.61–1.24)
GFR calc Af Amer: >60 mL/min (ref >60)
GFR calc non Af Amer: >60 mL/min (ref >60)
Glucose, Bld: 211 mg/dL — ABNORMAL HIGH (ref 70–99)
Potassium: 3.5 mmol/L (ref 3.5–5.1)
Sodium: 136 mmol/L (ref 135–145)
Total Bilirubin: 0.9 mg/dL (ref 0.3–1.2)
Total Protein: 5.9 g/dL — ABNORMAL LOW (ref 6.5–8.1)

## 2019-09-29 LAB — MAGNESIUM: Magnesium: 1.7 mg/dL (ref 1.7–2.4)

## 2019-09-29 LAB — CBC WITH DIFFERENTIAL/PLATELET
Abs Immature Granulocytes: 1.03 10*3/uL — ABNORMAL HIGH (ref 0.00–0.07)
Basophils Absolute: 0 10*3/uL (ref 0.0–0.1)
Basophils Relative: 0 %
Eosinophils Absolute: 0.1 10*3/uL (ref 0.0–0.5)
Eosinophils Relative: 1 %
HCT: 36.2 % — ABNORMAL LOW (ref 39.0–52.0)
Hemoglobin: 11.9 g/dL — ABNORMAL LOW (ref 13.0–17.0)
Immature Granulocytes: 9 %
Lymphocytes Relative: 13 %
Lymphs Abs: 1.4 10*3/uL (ref 0.7–4.0)
MCH: 30.3 pg (ref 26.0–34.0)
MCHC: 32.9 g/dL (ref 30.0–36.0)
MCV: 92.1 fL (ref 80.0–100.0)
Monocytes Absolute: 0.5 10*3/uL (ref 0.1–1.0)
Monocytes Relative: 4 %
Neutro Abs: 8.3 10*3/uL — ABNORMAL HIGH (ref 1.7–7.7)
Neutrophils Relative %: 73 %
Platelets: 170 10*3/uL (ref 150–400)
RBC: 3.93 MIL/uL — ABNORMAL LOW (ref 4.22–5.81)
RDW: 13.2 % (ref 11.5–15.5)
WBC: 11.4 10*3/uL — ABNORMAL HIGH (ref 4.0–10.5)
nRBC: 0 % (ref 0.0–0.2)

## 2019-09-29 LAB — GLUCOSE, CAPILLARY
Glucose-Capillary: 219 mg/dL — ABNORMAL HIGH (ref 70–99)
Glucose-Capillary: 213 mg/dL — ABNORMAL HIGH (ref 70–99)
Glucose-Capillary: 120 mg/dL — ABNORMAL HIGH (ref 70–99)
Glucose-Capillary: 146 mg/dL — ABNORMAL HIGH (ref 70–99)
Glucose-Capillary: 131 mg/dL — ABNORMAL HIGH (ref 70–99)
Glucose-Capillary: 199 mg/dL — ABNORMAL HIGH (ref 70–99)

## 2019-09-29 LAB — SURGICAL PCR SCREEN
MRSA, PCR: NEGATIVE
Staphylococcus aureus: POSITIVE — AB

## 2019-09-29 SURGERY — IRRIGATION AND DEBRIDEMENT ABSCESS
Anesthesia: General | Site: Perineum | Laterality: Right

## 2019-09-29 MED ORDER — ONDANSETRON HCL 4 MG/2ML IJ SOLN
INTRAMUSCULAR | Status: AC
  Filled 2019-09-29: qty 2

## 2019-09-29 MED ORDER — INSULIN ASPART 100 UNIT/ML ~~LOC~~ SOLN
10.0000 [IU] | Freq: Three times a day (TID) | SUBCUTANEOUS | Status: DC
  Administered 2019-09-29 (×2): 10 [IU] via SUBCUTANEOUS

## 2019-09-29 MED ORDER — FENTANYL CITRATE (PF) 100 MCG/2ML IJ SOLN
INTRAMUSCULAR | Status: DC | PRN
  Administered 2019-09-29: 100 ug via INTRAVENOUS
  Administered 2019-09-29: 50 ug via INTRAVENOUS
  Administered 2019-09-29: 100 ug via INTRAVENOUS

## 2019-09-29 MED ORDER — SUCCINYLCHOLINE CHLORIDE 200 MG/10ML IV SOSY
PREFILLED_SYRINGE | INTRAVENOUS | Status: DC | PRN
  Administered 2019-09-29: 100 mg via INTRAVENOUS

## 2019-09-29 MED ORDER — DEXAMETHASONE SODIUM PHOSPHATE 10 MG/ML IJ SOLN
INTRAMUSCULAR | Status: DC | PRN
  Administered 2019-09-29: 5 mg via INTRAVENOUS

## 2019-09-29 MED ORDER — ROCURONIUM BROMIDE 10 MG/ML (PF) SYRINGE
PREFILLED_SYRINGE | INTRAVENOUS | Status: AC
  Filled 2019-09-29: qty 10

## 2019-09-29 MED ORDER — FENTANYL CITRATE (PF) 100 MCG/2ML IJ SOLN
INTRAMUSCULAR | Status: AC
  Filled 2019-09-29: qty 2

## 2019-09-29 MED ORDER — ONDANSETRON HCL 4 MG/2ML IJ SOLN: 4.0000 mg | Freq: Once | INTRAMUSCULAR | Status: DC | PRN

## 2019-09-29 MED ORDER — LIDOCAINE 2% (20 MG/ML) 5 ML SYRINGE
INTRAMUSCULAR | Status: DC | PRN
  Administered 2019-09-29: 60 mg via INTRAVENOUS

## 2019-09-29 MED ORDER — MIDAZOLAM HCL 5 MG/5ML IJ SOLN
INTRAMUSCULAR | Status: DC | PRN
  Administered 2019-09-29: 2 mg via INTRAVENOUS

## 2019-09-29 MED ORDER — DEXMEDETOMIDINE HCL 200 MCG/2ML IV SOLN
INTRAVENOUS | Status: DC | PRN
  Administered 2019-09-29 (×3): 8 ug via INTRAVENOUS

## 2019-09-29 MED ORDER — INSULIN GLARGINE 100 UNIT/ML ~~LOC~~ SOLN
23.0000 [IU] | Freq: Every day | SUBCUTANEOUS | Status: DC
  Administered 2019-09-29: 22:00:00 23 [IU] via SUBCUTANEOUS
  Filled 2019-09-29 (×2): qty 0.23

## 2019-09-29 MED ORDER — LIDOCAINE 2% (20 MG/ML) 5 ML SYRINGE
INTRAMUSCULAR | Status: AC
  Filled 2019-09-29: qty 5

## 2019-09-29 MED ORDER — ONDANSETRON HCL 4 MG/2ML IJ SOLN
INTRAMUSCULAR | Status: DC | PRN
  Administered 2019-09-29: 4 mg via INTRAVENOUS

## 2019-09-29 MED ORDER — FENTANYL CITRATE (PF) 100 MCG/2ML IJ SOLN
25.0000 ug | INTRAMUSCULAR | Status: DC | PRN
  Administered 2019-09-29: 50 ug via INTRAVENOUS

## 2019-09-29 MED ORDER — SUGAMMADEX SODIUM 200 MG/2ML IV SOLN
INTRAVENOUS | Status: DC | PRN
  Administered 2019-09-29: 400 mg via INTRAVENOUS

## 2019-09-29 MED ORDER — GLYCOPYRROLATE PF 0.2 MG/ML IJ SOSY
PREFILLED_SYRINGE | INTRAMUSCULAR | Status: AC
  Filled 2019-09-29: qty 1

## 2019-09-29 MED ORDER — LACTATED RINGERS IV SOLN: Freq: Once | INTRAVENOUS | Status: AC

## 2019-09-29 MED ORDER — PHENYLEPHRINE 40 MCG/ML (10ML) SYRINGE FOR IV PUSH (FOR BLOOD PRESSURE SUPPORT)
PREFILLED_SYRINGE | INTRAVENOUS | Status: AC
  Filled 2019-09-29: qty 10

## 2019-09-29 MED ORDER — SUCCINYLCHOLINE CHLORIDE 200 MG/10ML IV SOSY
PREFILLED_SYRINGE | INTRAVENOUS | Status: AC
  Filled 2019-09-29: qty 10

## 2019-09-29 MED ORDER — HYDROMORPHONE HCL 1 MG/ML IJ SOLN
INTRAMUSCULAR | Status: AC
  Administered 2019-09-29: 16:00:00 0.5 mg via INTRAVENOUS
  Filled 2019-09-29: qty 1

## 2019-09-29 MED ORDER — OXYCODONE HCL 5 MG/5ML PO SOLN: 5.0000 mg | Freq: Once | ORAL | Status: DC | PRN

## 2019-09-29 MED ORDER — LACTATED RINGERS IV SOLN: INTRAVENOUS | Status: DC

## 2019-09-29 MED ORDER — OXYCODONE HCL 5 MG PO TABS: 5.0000 mg | ORAL_TABLET | Freq: Once | ORAL | Status: DC | PRN

## 2019-09-29 MED ORDER — PROPOFOL 10 MG/ML IV BOLUS
INTRAVENOUS | Status: DC | PRN
  Administered 2019-09-29: 200 mg via INTRAVENOUS

## 2019-09-29 MED ORDER — FENTANYL CITRATE (PF) 250 MCG/5ML IJ SOLN
INTRAMUSCULAR | Status: AC
  Filled 2019-09-29: qty 5

## 2019-09-29 MED ORDER — DEXAMETHASONE SODIUM PHOSPHATE 10 MG/ML IJ SOLN
INTRAMUSCULAR | Status: AC
  Filled 2019-09-29: qty 1

## 2019-09-29 MED ORDER — 0.9 % SODIUM CHLORIDE (POUR BTL) OPTIME
TOPICAL | Status: DC | PRN
  Administered 2019-09-29: 1000 mL

## 2019-09-29 MED ORDER — POLYETHYLENE GLYCOL 3350 17 G PO PACK
17.0000 g | PACK | Freq: Every day | ORAL | Status: DC
  Administered 2019-10-01: 17 g via ORAL
  Filled 2019-09-29 (×4): qty 1

## 2019-09-29 MED ORDER — ROCURONIUM BROMIDE 50 MG/5ML IV SOSY
PREFILLED_SYRINGE | INTRAVENOUS | Status: DC | PRN
  Administered 2019-09-29: 50 mg via INTRAVENOUS

## 2019-09-29 MED ORDER — MIDAZOLAM HCL 2 MG/2ML IJ SOLN
INTRAMUSCULAR | Status: AC
  Filled 2019-09-29: qty 2

## 2019-09-29 SURGICAL SUPPLY — 40 items
APL PRP STRL LF DISP 70% ISPRP (MISCELLANEOUS)
BNDG GAUZE ELAST 4 BULKY (GAUZE/BANDAGES/DRESSINGS) ×1 IMPLANT
CANISTER SUCT 3000ML PPV (MISCELLANEOUS) IMPLANT
CHLORAPREP W/TINT 26 (MISCELLANEOUS) IMPLANT
CLEANER TIP ELECTROSURG 2X2 (MISCELLANEOUS) ×1 IMPLANT
COVER SURGICAL LIGHT HANDLE (MISCELLANEOUS) ×2 IMPLANT
COVER WAND RF STERILE (DRAPES) ×1 IMPLANT
DECANTER SPIKE VIAL GLASS SM (MISCELLANEOUS) IMPLANT
DRAPE HALF SHEET 40X57 (DRAPES) IMPLANT
DRAPE LAPAROSCOPIC ABDOMINAL (DRAPES) ×1 IMPLANT
DRSG PAD ABDOMINAL 8X10 ST (GAUZE/BANDAGES/DRESSINGS) IMPLANT
ELECT REM PT RETURN 9FT ADLT (ELECTROSURGICAL) ×2
ELECTRODE REM PT RTRN 9FT ADLT (ELECTROSURGICAL) ×1 IMPLANT
GAUZE 4X4 16PLY RFD (DISPOSABLE) IMPLANT
GAUZE SPONGE 4X4 12PLY STRL (GAUZE/BANDAGES/DRESSINGS) ×1 IMPLANT
GAUZE SPONGE 4X4 12PLY STRL LF (GAUZE/BANDAGES/DRESSINGS) ×1 IMPLANT
GLOVE BIO SURGEON STRL SZ7 (GLOVE) ×2 IMPLANT
GLOVE BIOGEL PI IND STRL 7.5 (GLOVE) ×1 IMPLANT
GLOVE BIOGEL PI INDICATOR 7.5 (GLOVE) ×1
GOWN STRL REUS W/ TWL LRG LVL3 (GOWN DISPOSABLE) ×2 IMPLANT
GOWN STRL REUS W/TWL LRG LVL3 (GOWN DISPOSABLE) ×4
KIT BASIN OR (CUSTOM PROCEDURE TRAY) ×2 IMPLANT
KIT TURNOVER KIT B (KITS) ×2 IMPLANT
NDL HYPO 25GX1X1/2 BEV (NEEDLE) IMPLANT
NEEDLE HYPO 25GX1X1/2 BEV (NEEDLE) IMPLANT
NS IRRIG 1000ML POUR BTL (IV SOLUTION) ×2 IMPLANT
PACK GENERAL/GYN (CUSTOM PROCEDURE TRAY) ×2 IMPLANT
PACK LITHOTOMY IV (CUSTOM PROCEDURE TRAY) ×1 IMPLANT
PAD ABD 8X10 STRL (GAUZE/BANDAGES/DRESSINGS) ×1 IMPLANT
PAD ARMBOARD 7.5X6 YLW CONV (MISCELLANEOUS) ×3 IMPLANT
PENCIL SMOKE EVACUATOR (MISCELLANEOUS) ×2 IMPLANT
SPECIMEN JAR SMALL (MISCELLANEOUS) ×2 IMPLANT
SUT MNCRL AB 4-0 PS2 18 (SUTURE) IMPLANT
SUT VIC AB 3-0 SH 27 (SUTURE)
SUT VIC AB 3-0 SH 27XBRD (SUTURE) IMPLANT
SWAB COLLECTION DEVICE MRSA (MISCELLANEOUS) ×1 IMPLANT
SWAB CULTURE ESWAB REG 1ML (MISCELLANEOUS) IMPLANT
SYR CONTROL 10ML LL (SYRINGE) IMPLANT
TOWEL GREEN STERILE (TOWEL DISPOSABLE) ×2 IMPLANT
TOWEL GREEN STERILE FF (TOWEL DISPOSABLE) ×2 IMPLANT

## 2019-09-29 NOTE — Progress Notes (Signed)
Patient back to room 4E26 from OR. Vital signs obtained. Alert and oriented to room and call light. On monitor CCMD notified.

## 2019-09-29 NOTE — Op Note (Addendum)
Preoperative diagnosis: perineal and right groin necrotizing soft tissue infection POSTOP DIAGNOSIS:saa PROCEDURE:debridement of skin, soft tissue right groin and perineum measuring 22x8x4 cm SURGEON:Matt Dalesha Stanback  ANESTHESIA:general EBL:72ml BLOOD ADMINISTERED: none DRAINS: none Specimens: right thigh and perineal tissue COUNTS CORRECT:correct at completion Dispo: to pacu stable  INDICATIONS FOR PROCEDURE:48 yom with diabetes taken to the OR for a perineal and thigh infection. This was drained with multiple counterincisions and penrose drains. His wbc is elevated today with a fever and ongoing foul drainage from his incisions when I saw him today. I discussed going back to OR urgently today.   PROCEDURE: After informed consent obtained he was taken to the OR. He was already on antibiotics. SCDs were in place. He was placed under general anesthesia without complication. He was prepped and draped in standard sterile surgical fashion. He was in lithotomy position and appropriately padded.    I removed the penrose drains and then connected all the incisions. I then proceeded to debride and additional 22x8x4 cm area of subcutaneous tissue, fascia and skin. This was all necrotic. There was purulence. This was a necrotizing soft tissue infection. This was done with cautery and sharply. This entire necrotic tissue was sent to pathology. The entire wound now measures 22x8x4 cm in size. Hemostasis was observed. I then packed this with kerlix and dressing placed. She tolerated well.   Excisional debridement:  1. Progress note or procedure note with a detailed description of the procedure. See above  2. Tool used for debridement (curette, scapel, etc.) Scalpel and cautery  3. Frequency of surgical debridement. second time  4. Measurement of total devitalized tissue (wound surface) before and after surgical debridement. After 22x8x4, before was just small  holes with drains  5. Area and depth of devitalized tissue removed from wound. 22x8x4  6. Blood loss and description of tissue removed. 25 cc, necrotic tissue  7. Evidence of the progress of the wound's response to treatment. A. Current wound volume (current dimensions and depth). 22x8x4 cm B. Presence (and extent of) of infection. As above C. Presence (and extent of) of non viable tissue. No more D. Other material in the wound that is expected to inhibit healing. None identified  8. Was there any viable tissue removed (measurements): no

## 2019-09-29 NOTE — Anesthesia Postprocedure Evaluation (Signed)
Anesthesia Post Note  Patient: Logan Weiss  Procedure(s) Performed: IRRIGATION AND DEBRIDEMENT Right perineum and thigh (Right Perineum)     Patient location during evaluation: PACU Anesthesia Type: General Level of consciousness: awake and alert Pain management: pain level controlled Vital Signs Assessment: post-procedure vital signs reviewed and stable Respiratory status: spontaneous breathing, nonlabored ventilation, respiratory function stable and patient connected to nasal cannula oxygen Cardiovascular status: blood pressure returned to baseline and stable Postop Assessment: no apparent nausea or vomiting Anesthetic complications: no    Last Vitals:  Vitals:   09/29/19 1207 09/29/19 1453  BP: 114/68 (!) 127/95  Pulse: 87 96  Resp: 17 19  Temp: 36.8 C (!) 36.4 C  SpO2: 95% 94%    Last Pain:  Vitals:   09/29/19 1453  TempSrc:   PainSc: 4                  Lucretia Kern

## 2019-09-29 NOTE — Anesthesia Procedure Notes (Signed)
Procedure Name: Intubation Date/Time: 09/29/2019 2:21 PM Performed by: Marena Chancy, CRNA Pre-anesthesia Checklist: Patient identified, Emergency Drugs available, Suction available and Patient being monitored Patient Re-evaluated:Patient Re-evaluated prior to induction Oxygen Delivery Method: Circle System Utilized Preoxygenation: Pre-oxygenation with 100% oxygen Induction Type: IV induction, Cricoid Pressure applied and Rapid sequence Laryngoscope Size: Miller and 2 Grade View: Grade II Tube type: Oral Tube size: 7.5 mm Number of attempts: 1 Airway Equipment and Method: Stylet and Oral airway Placement Confirmation: ETT inserted through vocal cords under direct vision,  positive ETCO2 and breath sounds checked- equal and bilateral Tube secured with: Tape Dental Injury: Teeth and Oropharynx as per pre-operative assessment

## 2019-09-29 NOTE — Progress Notes (Signed)
PT Cancellation Note  Patient Details Name: Logan Weiss MRN: [id number] DOB: January 17, 1971   Cancelled Treatment:    Reason Eval/Treat Not Completed: Other (comment). Declined PT since pt has expectation of surgery at 2 today.  Nursing aware pt has declined PT and will retry later as time and pt allow.    Ivar Drape 09/29/2019, 11:23 AM   Samul Dada, PT MS Acute Rehab Dept. Number: [facility] [id number] and [facility] [phone]

## 2019-09-29 NOTE — Transfer of Care (Signed)
Immediate Anesthesia Transfer of Care Note  Patient: Logan Weiss  Procedure(s) Performed: IRRIGATION AND DEBRIDEMENT Right perineum and thigh (Right Perineum)  Patient Location: PACU  Anesthesia Type:General  Level of Consciousness: awake, alert  and oriented  Airway & Oxygen Therapy: Patient Spontanous Breathing and Patient connected to nasal cannula oxygen  Post-op Assessment: Report given to RN, Post -op Vital signs reviewed and stable and Patient moving all extremities X 4  Post vital signs: Reviewed and stable  Last Vitals:  Vitals Value Taken Time  BP 144/111 09/29/19 1456  Temp    Pulse 98 09/29/19 1456  Resp 21 09/29/19 1456  SpO2 93 % 09/29/19 1456  Vitals shown include unvalidated device data.  Last Pain:  Vitals:   09/29/19 1207  TempSrc: Oral  PainSc:       Patients Stated Pain Goal: 0 (09/28/19 2228)  Complications: No apparent anesthesia complications

## 2019-09-29 NOTE — Consult Note (Signed)
   [facility] CM Inpatient Consult   09/29/2019  CHEVY SWEIGERT 1971/02/07 [id number]   Follow up:  Referral Diabetes Coordinator  Attempts to reach patient this morning, by hospital phone in room was not successful.  Chart briefly reviewed and patient is for a surgical procedure this afternoon.  Plan:  Follow up with inpatient Transition of Care [when on care team assigned] to alert of referral from Diabetes Coordinator and [facility] Care Management to follow for diabetes support management.  Continue to follow for progress, disposition, and needs.  For questions, please contact:  [name], RN BSN CCM [facility]  [phone] business mobile phone Toll free office [phone]  Fax number: [phone] [email or web address]

## 2019-09-29 NOTE — Anesthesia Preprocedure Evaluation (Signed)
Anesthesia Evaluation  Patient identified by MRN, date of birth, ID band Patient awake    Reviewed: Allergy & Precautions, NPO status , Patient's Chart, lab work & pertinent test results  History of Anesthesia Complications Negative for: history of anesthetic complications  Airway Mallampati: II  TM Distance: >3 FB Neck ROM: Full    Dental  (+) Teeth Intact   Pulmonary Current Smoker,    Pulmonary exam normal        Cardiovascular negative cardio ROS Normal cardiovascular exam     Neuro/Psych negative neurological ROS  negative psych ROS   GI/Hepatic negative GI ROS, Neg liver ROS,   Endo/Other  diabetes, Type 2  Renal/GU negative Renal ROS  negative genitourinary   Musculoskeletal negative musculoskeletal ROS (+)   Abdominal   Peds  Hematology negative hematology ROS (+)   Anesthesia Other Findings  Perineal/thigh abscess  Reproductive/Obstetrics                            Anesthesia Physical Anesthesia Plan  ASA: III and emergent  Anesthesia Plan: General   Post-op Pain Management:    Induction: Intravenous, Rapid sequence and Cricoid pressure planned  PONV Risk Score and Plan: 1 and Ondansetron, Dexamethasone, Treatment may vary due to age or medical condition and Midazolam  Airway Management Planned: Oral ETT  Additional Equipment: None  Intra-op Plan:   Post-operative Plan: Extubation in OR  Informed Consent: I have reviewed the patients History and Physical, chart, labs and discussed the procedure including the risks, benefits and alternatives for the proposed anesthesia with the patient or authorized representative who has indicated his/her understanding and acceptance.     Dental advisory given  Plan Discussed with:   Anesthesia Plan Comments:        Anesthesia Quick Evaluation

## 2019-09-29 NOTE — Progress Notes (Signed)
2 Days Post-Op  Subjective: CC: Patient reports stable pain to the area of I&D. Tolerated dressing change this morning. Nurse reports dressing was saturated this am during dressing change. Mobilized well yesterday. He is tolerating a diet without abdominal pain, n/v. Last PO 8am. Frosted flakes and eggs.   Objective: Vital signs in last 24 hours: Temp:  [97.9 F (36.6 C)-101.9 F (38.8 C)] 100.2 F (37.9 C) (03/23 0740) Pulse Rate:  [85-117] 102 (03/23 0740) Resp:  [17-25] 17 (03/23 0740) BP: (95-125)/(64-75) 114/70 (03/23 0740) SpO2:  [93 %-100 %] 97 % (03/23 0740) Weight:  [85.5 kg] 85.5 kg (03/23 0428) Last BM Date: 09/28/19  Intake/Output from previous day: 03/22 0701 - 03/23 0700 In: -  Out: 3050 [Urine:3050] Intake/Output this shift: Total I/O In: -  Out: 375 [Urine:375]  PE: Gen: Awake and alert, NAD Lungs: Normal rate and effort GU: Chaperone RN present, there is incision extending from the right perineum to the medial right thigh with penrose x 2 in place. Dressing with grey purulent drainage noted on dressing. Similar drainage noted from wound. There is an enlarging area of erythema, edema, induration and warmth just superior to the proximal aspect of the incision that tracks to the medial thigh and the right lateral mons. He is more tender to palpation over this area today. Patient was repositioned to exam the perineum. He does have a small area of tenderness with induration that extends from the base of the incision past the intergluteal cleft towards the perineum. There is no obvious fluctuance or skin breakdown over this area. Please see picture below.      Lab Results:  Recent Labs    09/28/19 0846 09/29/19 0829  WBC 8.4 11.4*  HGB 11.9* 11.9*  HCT 35.9* 36.2*  PLT 177 170   BMET Recent Labs    09/27/19 2134 09/28/19 0305  NA 135 136  K 3.5 4.0  CL 102 101  CO2 21* 22  GLUCOSE 233* 212*  BUN <5* <5*  CREATININE 0.81 0.76  CALCIUM 8.0* 8.2*    PT/INR No results for input(s): LABPROT, INR in the last 72 hours. CMP     Component Value Date/Time   NA 136 09/28/2019 0305   K 4.0 09/28/2019 0305   CL 101 09/28/2019 0305   CO2 22 09/28/2019 0305   GLUCOSE 212 (H) 09/28/2019 0305   BUN <5 (L) 09/28/2019 0305   CREATININE 0.76 09/28/2019 0305   CREATININE 0.78 06/05/2014 1155   CALCIUM 8.2 (L) 09/28/2019 0305   PROT 6.4 (L) 09/28/2019 0305   ALBUMIN 2.5 (L) 09/28/2019 0305   AST 14 (L) 09/28/2019 0305   ALT 11 09/28/2019 0305   ALKPHOS 73 09/28/2019 0305   BILITOT 1.0 09/28/2019 0305   GFRNONAA >60 09/28/2019 0305   GFRNONAA >89 06/05/2014 1155   GFRAA >60 09/28/2019 0305   GFRAA >89 06/05/2014 1155   Lipase     Component Value Date/Time   LIPASE 39 04/13/2018 0831       Studies/Results: No results found.  Anti-infectives: Anti-infectives (From admission, onward)   Start     Dose/Rate Route Frequency Ordered Stop   09/27/19 2030  meropenem (MERREM) 1 g in sodium chloride 0.9 % 100 mL IVPB     1 g 200 mL/hr over 30 Minutes Intravenous Every 8 hours 09/27/19 1938     09/27/19 2030  vancomycin (VANCOCIN) IVPB 1000 mg/200 mL premix     1,000 mg 200 mL/hr over 60 Minutes  Intravenous Every 12 hours 09/27/19 1938     09/27/19 2015  clindamycin (CLEOCIN) IVPB 600 mg     600 mg 100 mL/hr over 30 Minutes Intravenous Every 8 hours 09/27/19 1913     09/27/19 0600  vancomycin (VANCOREADY) IVPB 1750 mg/350 mL     1,750 mg 175 mL/hr over 120 Minutes Intravenous  Once 09/27/19 0553 09/27/19 0823   09/27/19 0600  piperacillin-tazobactam (ZOSYN) IVPB 3.375 g     3.375 g 100 mL/hr over 30 Minutes Intravenous  Once 09/27/19 0553 09/27/19 0621   09/27/19 0600  clindamycin (CLEOCIN) IVPB 900 mg     900 mg 100 mL/hr over 30 Minutes Intravenous  Once 09/27/19 0558 09/27/19 1062       Assessment/Plan DM2 w/ DKA on presentation   R perineal and thigh abscess with soft tissue infection - s/p irrigation and debridement  of R perineum and thigh - Dr. Bedelia Person - 3/21 - POD #2 - Febrile overnight, wbc trending up and more tender on todays exam with induration tracking up groin as well as towards perineum. Examined with Dr. Dwain Sarna. Will post for OR today.  - Cont abx. Await cx's - Mobilize. PT/OT rec no follow up   FEN - NPO VTE - SCDs, okay for chemical prophylaxis from a general surgery standpoint ID - Clinda/Merrem/Vanc 3/21 >> Cx pending. Febrile overnight. WBC up from 8.4 to 11.4 Foley - None  Follow-Up - TBD   LOS: 2 days    Jacinto Halim , [facility] Surgery 09/29/2019, 9:40 AM Please see Amion for pager number during day hours 7:00am-4:30pm

## 2019-09-29 NOTE — Progress Notes (Signed)
   Subjective: Pt seen at the bedside this morning. Feeling okay, but still having groin pain. Denies chills, nausea, or abdominal pain. Had a fever last evening to 101.1F. Tachycardic in the room with HR in the 110's. Discussed continued antibiotics and treatment of blood sugars with potential need to return to the OR. Pt and wife understanding, all questions and concerns addressed.   Objective:  Vital signs in last 24 hours: Vitals:   09/29/19 0100 09/29/19 0300 09/29/19 0400 09/29/19 0428  BP:   125/74   Pulse:   (!) 102   Resp: 20 19 20    Temp:   99.9 F (37.7 C)   TempSrc:   Oral   SpO2:   96%   Weight:    85.5 kg  Height:       Physical Exam Vitals and nursing note reviewed.  Constitutional:      General: He is not in acute distress.    Appearance: He is not ill-appearing.  Cardiovascular:     Comments: Tachy on tele to 110's. Pulmonary:     Effort: Pulmonary effort is normal.  Skin:    General: Skin is warm and dry.     Comments: Surgical penrose in place x 2. Minimal drainage overlying fresh dressing. Surrounding erythema down pt's R thigh.  Neurological:     Mental Status: He is alert.    Assessment/Plan:  Principal Problem:   Abscess of multiple sites of perineum Active Problems:   Type 2 diabetes mellitus with hyperglycemia (HCC)   Abscess  Mr. Swigert is a 49 year old M with significant PMH of uncontrolled type II diabetes who presents with a R perineal abscess and soft tissue infection with subsequent hyperglycemia.  R perineal abscess and soft tissue infection CT imaging of the pelvis on 3/21 revealed large area from the anteromedial R thigh to the posterior buttock with fluid and air and without discrete abscess consistent with an infectious process, nec fasc could not be excluded. - surgical I&D on 3/21 - day 3 of vanc/merrem/clinda - continue to spike fevers, Tmax of 101.4 at 8PM yesterday - WBC 8.4 > 11.4 - mildly tachycardiac in 100's with normal  SBP in 100-120s - surgical gram stain growing gram + cocci, gram negative robs, and gram + rods - culture with staph aureus and group B strep, susceptibles pending  - will likely need ID consult for final antibiotic recs after surgical debridements   General surgery consulted, appreciate their input in this case - post-op day 2 from I&D - febrile, uptrending WBC, and increasingly tender with induration on exam - plan to return to OR today - monitor wound and dressing changes - mobilize   DKA on presentation, since resolved. Uncontrolled type II DM A1c 13.3 on admission. Per chart review, pt on pioglitazone 30mg  daily and glimepiride 4mg  daily. - sugars improved since admission, though still elevated likely in the setting of persistent infection - will increase subQ insulin regimen - increase novolog to 10u TID with meals - continue lantus 20u daily and resistent + qhs SSI - pt will need levemir pens to start long acting insulin at discharge  Prior to Admission Living Arrangement: home Anticipated Discharge Location: home Barriers to Discharge: continued need for surgical I&D Dispo: Anticipated discharge pending clinical improvement   4/21, MD 09/29/2019, 6:34 AM Pager: [phone]

## 2019-09-29 NOTE — Progress Notes (Signed)
Lots of new drainage. Dressing soiled. MD notified.

## 2019-09-30 DIAGNOSIS — I96 Gangrene, not elsewhere classified: Secondary | ICD-10-CM

## 2019-09-30 DIAGNOSIS — B9561 Methicillin susceptible Staphylococcus aureus infection as the cause of diseases classified elsewhere: Secondary | ICD-10-CM

## 2019-09-30 DIAGNOSIS — B951 Streptococcus, group B, as the cause of diseases classified elsewhere: Secondary | ICD-10-CM

## 2019-09-30 DIAGNOSIS — D649 Anemia, unspecified: Secondary | ICD-10-CM | POA: Diagnosis present

## 2019-09-30 DIAGNOSIS — L089 Local infection of the skin and subcutaneous tissue, unspecified: Secondary | ICD-10-CM

## 2019-09-30 DIAGNOSIS — B954 Other streptococcus as the cause of diseases classified elsewhere: Secondary | ICD-10-CM

## 2019-09-30 DIAGNOSIS — Z881 Allergy status to other antibiotic agents status: Secondary | ICD-10-CM

## 2019-09-30 DIAGNOSIS — F17211 Nicotine dependence, cigarettes, in remission: Secondary | ICD-10-CM

## 2019-09-30 DIAGNOSIS — Z888 Allergy status to other drugs, medicaments and biological substances status: Secondary | ICD-10-CM

## 2019-09-30 DIAGNOSIS — Z8614 Personal history of Methicillin resistant Staphylococcus aureus infection: Secondary | ICD-10-CM

## 2019-09-30 DIAGNOSIS — L0231 Cutaneous abscess of buttock: Secondary | ICD-10-CM

## 2019-09-30 DIAGNOSIS — E1152 Type 2 diabetes mellitus with diabetic peripheral angiopathy with gangrene: Secondary | ICD-10-CM

## 2019-09-30 DIAGNOSIS — M7989 Other specified soft tissue disorders: Secondary | ICD-10-CM

## 2019-09-30 LAB — CBC
HCT: 33.6 % — ABNORMAL LOW (ref 39.0–52.0)
Hemoglobin: 11.3 g/dL — ABNORMAL LOW (ref 13.0–17.0)
MCH: 30.7 pg (ref 26.0–34.0)
MCHC: 33.6 g/dL (ref 30.0–36.0)
MCV: 91.3 fL (ref 80.0–100.0)
Platelets: 249 10*3/uL (ref 150–400)
RBC: 3.68 MIL/uL — ABNORMAL LOW (ref 4.22–5.81)
RDW: 13.3 % (ref 11.5–15.5)
WBC: 14.8 10*3/uL — ABNORMAL HIGH (ref 4.0–10.5)
nRBC: 0 % (ref 0.0–0.2)

## 2019-09-30 LAB — GLUCOSE, CAPILLARY
Glucose-Capillary: 258 mg/dL — ABNORMAL HIGH (ref 70–99)
Glucose-Capillary: 258 mg/dL — ABNORMAL HIGH (ref 70–99)
Glucose-Capillary: 274 mg/dL — ABNORMAL HIGH (ref 70–99)
Glucose-Capillary: 115 mg/dL — ABNORMAL HIGH (ref 70–99)
Glucose-Capillary: 89 mg/dL (ref 70–99)

## 2019-09-30 LAB — BASIC METABOLIC PANEL
Anion gap: 13 (ref 5–15)
BUN: <5 mg/dL — ABNORMAL LOW (ref 6–20)
CO2: 27 mmol/L (ref 22–32)
Calcium: 8.5 mg/dL — ABNORMAL LOW (ref 8.9–10.3)
Chloride: 99 mmol/L (ref 98–111)
Creatinine, Ser: 0.78 mg/dL (ref 0.61–1.24)
GFR calc Af Amer: >60 mL/min (ref >60)
GFR calc non Af Amer: >60 mL/min (ref >60)
Glucose, Bld: 247 mg/dL — ABNORMAL HIGH (ref 70–99)
Potassium: 3.6 mmol/L (ref 3.5–5.1)
Sodium: 139 mmol/L (ref 135–145)

## 2019-09-30 LAB — VANCOMYCIN, PEAK: Vancomycin Pk: 10 ug/mL — ABNORMAL LOW (ref 30–40)

## 2019-09-30 LAB — SURGICAL PATHOLOGY

## 2019-09-30 MED ORDER — INSULIN ASPART 100 UNIT/ML ~~LOC~~ SOLN
12.0000 [IU] | Freq: Three times a day (TID) | SUBCUTANEOUS | Status: DC
  Administered 2019-09-30: 12 [IU] via SUBCUTANEOUS

## 2019-09-30 MED ORDER — ENOXAPARIN SODIUM 40 MG/0.4ML ~~LOC~~ SOLN
40.0000 mg | SUBCUTANEOUS | Status: DC
  Administered 2019-09-30 – 2019-10-04 (×5): 40 mg via SUBCUTANEOUS
  Filled 2019-09-30 (×5): qty 0.4

## 2019-09-30 MED ORDER — ADULT MULTIVITAMIN W/MINERALS CH
1.0000 | ORAL_TABLET | Freq: Every day | ORAL | Status: DC
  Administered 2019-09-30 – 2019-10-04 (×5): 1 via ORAL
  Filled 2019-09-30 (×5): qty 1

## 2019-09-30 MED ORDER — PRO-STAT SUGAR FREE PO LIQD
30.0000 mL | Freq: Two times a day (BID) | ORAL | Status: DC
  Administered 2019-09-30 – 2019-10-04 (×9): 30 mL via ORAL
  Filled 2019-09-30 (×9): qty 30

## 2019-09-30 MED ORDER — JUVEN PO PACK
1.0000 | PACK | Freq: Two times a day (BID) | ORAL | Status: DC
  Administered 2019-09-30 – 2019-10-04 (×8): 1 via ORAL
  Filled 2019-09-30 (×8): qty 1

## 2019-09-30 MED ORDER — SODIUM CHLORIDE 0.9 % IV SOLN
3.0000 g | Freq: Four times a day (QID) | INTRAVENOUS | Status: DC
  Administered 2019-09-30 – 2019-10-01 (×5): 3 g via INTRAVENOUS
  Filled 2019-09-30 (×4): qty 3
  Filled 2019-09-30: qty 8
  Filled 2019-09-30: qty 3

## 2019-09-30 MED ORDER — INSULIN GLARGINE 100 UNIT/ML ~~LOC~~ SOLN
30.0000 [IU] | Freq: Every day | SUBCUTANEOUS | Status: DC
  Administered 2019-09-30 – 2019-10-02 (×3): 30 [IU] via SUBCUTANEOUS
  Filled 2019-09-30 (×5): qty 0.3

## 2019-09-30 MED ORDER — INSULIN ASPART 100 UNIT/ML ~~LOC~~ SOLN
15.0000 [IU] | Freq: Three times a day (TID) | SUBCUTANEOUS | Status: DC
  Administered 2019-09-30 (×2): 15 [IU] via SUBCUTANEOUS

## 2019-09-30 NOTE — Progress Notes (Signed)
Physical Therapy Treatment Patient Details Name: Logan Weiss MRN: [id number] DOB: [birth year] Today's Date: 09/30/2019    History of Present Illness This 49 y.o. male admitted with Rt buttock abcess.  He underwent I&D on 09/27/2019 and second debridement on 09/29/19.  PMH includes: uncontrolled DM, h/o MRSA infection    PT Comments    Pt making good progress.  Able to ambulate 200' and transfer with supervision.  Ambulates with wide BOS and decreased stride due to pain, but stable and without LOB.  Recommend trying steps next visit if pt agreeable.     Follow Up Recommendations  No PT follow up     Equipment Recommendations  None recommended by PT    Recommendations for Other Services       Precautions / Restrictions Precautions Precautions: Fall    Mobility  Bed Mobility Overal bed mobility: Needs Assistance Bed Mobility: Supine to Sit;Sit to Supine     Supine to sit: HOB elevated;Supervision Sit to supine: HOB elevated;Supervision   General bed mobility comments: increased time and use of bed rails due to pain  Transfers Overall transfer level: Needs assistance Equipment used: None Transfers: Sit to/from Stand Sit to Stand: Supervision         General transfer comment: wide BOS and trunk flexion due to pain  Ambulation/Gait Ambulation/Gait assistance: Supervision Gait Distance (Feet): 200 Feet Assistive device: None Gait Pattern/deviations: Decreased stride length;Step-through pattern;Wide base of support Gait velocity: dec   General Gait Details: Declined going out in hall; ambulated with antalgic pattern and wide BOS due to pain   Stairs Stairs: (declined going out in hall)           Wheelchair Mobility    Modified Rankin (Stroke Patients Only)       Balance Overall balance assessment: Needs assistance Sitting-balance support: Bilateral upper extremity supported(to relief pressure on buttock) Sitting balance-Leahy Scale: Good      Standing balance support: No upper extremity supported Standing balance-Leahy Scale: Good                              Cognition Arousal/Alertness: Awake/alert Behavior During Therapy: WFL for tasks assessed/performed Overall Cognitive Status: Within Functional Limits for tasks assessed                                        Exercises      General Comments General comments (skin integrity, edema, etc.): VSS      Pertinent Vitals/Pain Pain Assessment: 0-10 Pain Score: 3  Pain Location: Rt buttock  Pain Descriptors / Indicators: Operative site guarding;Guarding Pain Intervention(s): Limited activity within patient's tolerance;Monitored during session;Repositioned    Home Living                      Prior Function            PT Goals (current goals can now be found in the care plan section) Progress towards PT goals: Progressing toward goals    Frequency    Min 3X/week      PT Plan Current plan remains appropriate    Co-evaluation              AM-PAC PT "6 Clicks" Mobility   Outcome Measure  Help needed turning from your back to your side while in a flat bed without using  bedrails?: None Help needed moving from lying on your back to sitting on the side of a flat bed without using bedrails?: None Help needed moving to and from a bed to a chair (including a wheelchair)?: None Help needed standing up from a chair using your arms (e.g., wheelchair or bedside chair)?: None Help needed to walk in hospital room?: None Help needed climbing 3-5 steps with a railing? : A Little 6 Click Score: 23    End of Session Equipment Utilized During Treatment: Gait belt Activity Tolerance: Patient tolerated treatment well Patient left: in bed;with call bell/phone within reach;with family/visitor present Nurse Communication: Mobility status PT Visit Diagnosis: Unsteadiness on feet (R26.81);Difficulty in walking, not elsewhere  classified (R26.2)     Time: 3734-2876 PT Time Calculation (min) (ACUTE ONLY): 17 min  Charges:  $Gait Training: 8-22 mins                     Maggie Font, PT Acute Rehab Services Pager [phone] [facility] [phone] [facility] [phone]    Karlton Lemon 09/30/2019, 12:53 PM

## 2019-09-30 NOTE — Progress Notes (Signed)
Initial Nutrition Assessment  DOCUMENTATION CODES:   Not applicable  INTERVENTION:    30 ml Prostat BID, each supplement provides 100 kcals and 15 grams protein.   Juven Fruit Punch BID, each serving provides 95kcal and 2.5g of protein (amino acids glutamine and arginine)  MVI daily   NUTRITION DIAGNOSIS:   Increased nutrient needs related to post-op healing as evidenced by estimated needs.  GOAL:   Patient will meet greater than or equal to 90% of their needs  MONITOR:   PO intake, Supplement acceptance, Labs, I & O's, Weight trends, Skin  REASON FOR ASSESSMENT:   Consult Diet education  ASSESSMENT:   Patient with PMH significant for uncontrolled DM. Presents this admission with perineal and R groin necrotizing soft tissue infection.   3/21- s/p I&D R perineum and thigh 3/23- s/p debridement R groin and perineum  Pt endorses having decreased intake over the last month due to his work schedule. Typically eats 2 meals daily that consist of B- boiled egg, piece of fruit D- meat, vegetable, grain. States his previous A1C at his PCP (unsure of date) was 17%, this has since decreased to 13.3%. He decided to make changes in activity levels by walking 3-5 miles daily.  RD provided "Carbohydrate Counting for People with Diabetes" handout from the Academy of [employer] and Dietetics. Discussed different food groups and their effects on blood sugar, emphasizing carbohydrate-containing foods. Provided list of carbohydrates and recommended serving sizes of common foods. Also dicussed the importance of protein intake to promote wound healing.   Weight shows to be stable over the last year.   I/O: -445 ml since admit  UOP: 3,975 ml x 24 hrs   Drips: LR @ 75 ml/hr  Medications: SS novolog, lantus, miralax Labs: CBG 131-274 A1c 13.3  Diet Order:   Diet Order            Diet heart healthy/carb modified Room service appropriate? Yes; Fluid consistency: Thin  Diet effective now               EDUCATION NEEDS:   Education needs have been addressed  Skin:  Skin Assessment: Skin Integrity Issues: Skin Integrity Issues:: Incisions Incisions: perineum, groin  Last BM:  3/22  Height:   Ht Readings from Last 1 Encounters:  09/27/19 5\' 6"  (1.676 m)    Weight:   Wt Readings from Last 1 Encounters:  09/30/19 83.5 kg    BMI:  Body mass index is 29.71 kg/m.  Estimated Nutritional Needs:   Kcal:  2000-2200 kcal  Protein:  100-120 grams  Fluid:  >/= 2 L/day   10/02/19 RD, LDN Clinical Nutrition Pager listed in AMION

## 2019-09-30 NOTE — Progress Notes (Signed)
   Subjective: Pt seen at the bedside with his wife. Feeling okay, and endorsing some pain after dressing changes this morning. Denies fevers, chills, or abdominal pain. Discussed the severity of the pt's infection and the importance of blood sugar control, dressing changes, and continued antibiotics. All questions and concerns addressed.   Objective:  Vital signs in last 24 hours: Vitals:   09/29/19 2244 09/29/19 2322 09/30/19 0426 09/30/19 0428  BP:  127/79 124/80   Pulse:  83 84   Resp:  17 17   Temp:  98.4 F (36.9 C) 98.1 F (36.7 C)   TempSrc:  Oral Oral   SpO2: 92% 98% 96%   Weight:    83.5 kg  Height:       Physical Exam Vitals and nursing note reviewed.  Constitutional:      General: He is not in acute distress.    Appearance: He is not ill-appearing.     Comments: Pt laying back in bed in frog leg position for comfort  Cardiovascular:     Rate and Rhythm: Normal rate and regular rhythm.     Heart sounds: Normal heart sounds.  Pulmonary:     Effort: Pulmonary effort is normal.  Skin:    General: Skin is warm and dry.  Neurological:     Mental Status: He is alert.    Assessment/Plan:  Principal Problem:   Abscess of multiple sites of perineum Active Problems:   Type 2 diabetes mellitus with hyperglycemia (HCC)   Abscess  Mr. Mcclinton is a 49 year old M with significant PMH of uncontrolled type II diabetes who presents with a R perineal abscess and soft tissue infection with subsequent hyperglycemia.   Necrotizing soft tissue infection R perineal abscess and soft tissue infection CT imaging of the pelvis on 3/21 revealed large area from the anteromedial R thigh to the posterior buttock with fluid and air and without discrete abscess consistent with an infectious process, nec fasc could not be excluded. - s/p I&D x 2, most recently yesterday with an area of 22x8x4cm debrided  - day 4 of vanc/merrem - completed 3 days of clinda on 3/23 - afebrile and  tachycardia resolved - WBC 8.4 > 11.4 > 14.8 - surgical gram stain growing gram + cocci, gram negative robs, and gram + rods - culture growing staph aureus, GBS, and strep constellatus - follow-up sensitivities  - blood cultures collected  - ID consulted today for antibiotic guidance   General surgery consulted, appreciate their input in this case - post op day 1 from debridement of skin/soft tissue/perineum and post-op day 3 from I&D - does not appear to need additional debridement at this time - wound dressing changes BID - mobilize with no PT/OT follow-up - resume pharmacologic DVT ppx   DKA on presentation, since resolved. Uncontrolled type II DM A1c 13.3 on admission. Per chart review, pt on pioglitazone 30mg  daily and glimepiride 4mg  daily. - continuing to adjust insulin for tighter Glc control for better wound healing - increase lantus to 30u daily and novolog to 15u TID with meals - continue resistent + qhs SSI - pt will need levemir pens to start long acting insulin at discharge   Prior to Admission Living Arrangement: home Anticipated Discharge Location: home with home health/wound care Barriers to Discharge: clinical improvement and wound care teaching Dispo: Anticipated discharge in approximately 2-3 day(s).   , MD 09/30/2019, 7:27 AM Pager: [phone]

## 2019-09-30 NOTE — Progress Notes (Signed)
1 Day Post-Op  Subjective: CC: Reports pain to area of I&D. Requiring IV pain medication for dressing changes. Afebrile overnight.   Objective: Vital signs in last 24 hours: Temp:  [97.5 F (36.4 C)-99.3 F (37.4 C)] 98.2 F (36.8 C) (03/24 0811) Pulse Rate:  [79-99] 96 (03/24 0811) Resp:  [16-21] 18 (03/24 0811) BP: (113-153)/(68-95) 120/80 (03/24 0811) SpO2:  [91 %-99 %] 99 % (03/24 0811) Weight:  [83.5 kg] 83.5 kg (03/24 0428) Last BM Date: 09/28/19  Intake/Output from previous day: 03/23 0701 - 03/24 0700 In: 2150 [P.O.:200; I.V.:1400; IV Piggyback:550] Out: 5009 [Urine:3975; Blood:75] Intake/Output this shift: Total I/O In: 240 [P.O.:240] Out: -   PE: Gen: Awake and alert, NAD Lungs: Normal rate and effort GU: Chaperone RN present, Please see picture below. Perineal and right groin wound with healthy appearing tissue at base. Skin surrounding without erythema, heat or induration.     Lab Results:  Recent Labs    09/29/19 0829 09/30/19 0331  WBC 11.4* 14.8*  HGB 11.9* 11.3*  HCT 36.2* 33.6*  PLT 170 249   BMET Recent Labs    09/29/19 0829 09/30/19 0331  NA 136 139  K 3.5 3.6  CL 102 99  CO2 22 27  GLUCOSE 211* 247*  BUN <5* <5*  CREATININE 0.74 0.78  CALCIUM 7.7* 8.5*   PT/INR No results for input(s): LABPROT, INR in the last 72 hours. CMP     Component Value Date/Time   NA 139 09/30/2019 0331   K 3.6 09/30/2019 0331   CL 99 09/30/2019 0331   CO2 27 09/30/2019 0331   GLUCOSE 247 (H) 09/30/2019 0331   BUN <5 (L) 09/30/2019 0331   CREATININE 0.78 09/30/2019 0331   CREATININE 0.78 06/05/2014 1155   CALCIUM 8.5 (L) 09/30/2019 0331   PROT 5.9 (L) 09/29/2019 0829   ALBUMIN 2.1 (L) 09/29/2019 0829   AST 15 09/29/2019 0829   ALT 11 09/29/2019 0829   ALKPHOS 75 09/29/2019 0829   BILITOT 0.9 09/29/2019 0829   GFRNONAA >60 09/30/2019 0331   GFRNONAA >89 06/05/2014 1155   GFRAA >60 09/30/2019 0331   GFRAA >89 06/05/2014 1155   Lipase     Component Value Date/Time   LIPASE 39 04/13/2018 0831       Studies/Results: No results found.  Anti-infectives: Anti-infectives (From admission, onward)   Start     Dose/Rate Route Frequency Ordered Stop   09/27/19 2030  meropenem (MERREM) 1 g in sodium chloride 0.9 % 100 mL IVPB     1 g 200 mL/hr over 30 Minutes Intravenous Every 8 hours 09/27/19 1938     09/27/19 2030  vancomycin (VANCOCIN) IVPB 1000 mg/200 mL premix     1,000 mg 200 mL/hr over 60 Minutes Intravenous Every 12 hours 09/27/19 1938     09/27/19 2015  clindamycin (CLEOCIN) IVPB 600 mg  Status:  Discontinued     600 mg 100 mL/hr over 30 Minutes Intravenous Every 8 hours 09/27/19 1913 09/30/19 0741   09/27/19 0600  vancomycin (VANCOREADY) IVPB 1750 mg/350 mL     1,750 mg 175 mL/hr over 120 Minutes Intravenous  Once 09/27/19 0553 09/27/19 0823   09/27/19 0600  piperacillin-tazobactam (ZOSYN) IVPB 3.375 g     3.375 g 100 mL/hr over 30 Minutes Intravenous  Once 09/27/19 0553 09/27/19 0621   09/27/19 0600  clindamycin (CLEOCIN) IVPB 900 mg     900 mg 100 mL/hr over 30 Minutes Intravenous  Once 09/27/19 0558 09/27/19  [id number]       Assessment/Plan DM2 w/ DKA on presentation  Perineal and right groin necrotizing soft tissue infection - POD #3 s/pirrigation and debridement of R perineum and thigh- Dr. Bedelia Person - 3/21 - POD #1 s/p debridement of skin, soft tissue right groin and perineum measuring22x8x4 cm - Cont abx. Cx with stap aureus and GBS. Await report for s/s - WTD dressing changes BID - Mobilize. PT/OT rec no follow up   FEN -NPO VTE -SCDs, okay for chemical prophylaxis from a general surgery standpoint ID -Clinda 3/21-3/23. Merrem/Vanc 3/21 >>  Foley - None  Follow-Up - TBD   LOS: 3 days    Jacinto Halim , [facility] Surgery 09/30/2019, 9:13 AM Please see Amion for pager number during day hours 7:00am-4:30pm

## 2019-09-30 NOTE — Consult Note (Signed)
[facility]    Date of Admission:  09/27/2019     Total days of antibiotics 5  Day 5 Meropenem  Day 5 Vancomycin   (Clindamycin x 4 days through 3/23)               Reason for Consult: Necrotizing soft tissue wound     Referring Provider: Mikey Bussing  Primary Care Provider: Malka So., MD    Assessment: Logan Weiss is a 49 y.o. male with uncontrolled diabetes mellitus here with necrotizing infection involving right thigh/groin. As expected wound cultures with polymicrobial growth including methicillin sensitive staph aureus, group B strep, strep constellatus with culture still pending. Agree with stopping Clindamycin given improvement in clinical status and no concerns over toxic state. Will narrow to ampicillin-sulbactam to provide coverage for all recovered pathogens as well as any possible anaerobic pathogens we could expect to be contributing given location. Antibiotic duration will depend on surgery's perspective of the wound bed - photos and notes reviewed from this AM and appears to be doing very well; if this continues to be the case he likely needs maybe 5-7 days of therapy from last surgery. Could also easily transition to oral Augmentin at discharge.   Counseled on importance of diabetes management - he sounds to be improving lifestyle significantly, but I see financial access has been a challenge for him.    Welcomed and answered all questions he and his wife, [name] have today.     Plan: 1. Consolidate all antibiotics to Unasyn alone 2. Follow wound assessments to help determine length of therapy    Will follow up tomorrow.     Principal Problem:   Necrotizing soft tissue infection Active Problems:   Abscess of multiple sites of perineum   Type 2 diabetes mellitus with hyperglycemia (HCC)   Normocytic anemia   . atorvastatin  20 mg Oral q1800  . enoxaparin (LOVENOX) injection  40 mg Subcutaneous Q24H  . insulin aspart   0-20 Units Subcutaneous TID WC  . insulin aspart  0-5 Units Subcutaneous QHS  . insulin aspart  15 Units Subcutaneous TID WC  . insulin glargine  30 Units Subcutaneous QHS  . polyethylene glycol  17 g Oral Daily    HPI: Logan Weiss is a 49 y.o. male admitted from home for evaluation of right buttock abscess that started about a week prior to admission.   He has a past medical history significant for type 2 diabetes with recent A1C 13%.   He states that he experienced worsening pain at the right buttock over the course of illness with progression to involve fevers of about 100 F and chills.  He feels much better since surgery and states he has not noticed any chills for 3 days now. He has had no headaches, cough, sore throat, chest pain, leg swelling, difficulty passing urine or bowels, changes to bowel movements, nausea/vomiting or abdominal pain. He does not have any other sites to his skin that have bothered him. In the ER CT scan revealed formed abscess with stranding and gas present --> could not rule out necrotizing fascitis. Leukocytosis noted as well as significant hyperglycemia to 718 and fever 100.3 F. Initially started on Vancomycin / Zosyn / Clindamycin --> changed to Meropenem / Vancomycin / Clindamycin.   Dr. Dwain Sarna took him to surgery 09/27/19 for drainage of two sites and then again urgently 3/23 for ongoing foul smelling discharge from wound  beds. Intraoperative cultures were taken and reveal methicillin sensitive staphylococcus aureus,   He states he has a longstanding history of skin boils when he was diagnosed with MRSA in the past. He has noted this gets much worse when his sugars are out of control. HGB A1C at start of diabetic therapy 17% but with oral medications and dietary/lifestyle changes he is down to 13%. He would like to avoid insulin use at all possible, but would accept it short term if needed. Previously on Invokana and Metrformin but this was not his recently  prescribed regimen.   He recently stopped smoking and drinking all alcohol a month ago.    Review of Systems: Review of Systems  All other systems reviewed and are negative.   Past Medical History:  Diagnosis Date  . Cellulitis 11/2017   of face   . Diabetes (HCC)    new dx  . History of MRSA infection     Social History   Tobacco Use  . Smoking status: Current Every Day Smoker    Packs/day: 0.00    Years: 5.00    Pack years: 0.00    Types: Cigarettes  . Smokeless tobacco: Never Used  Substance Use Topics  . Alcohol use: No    Alcohol/week: 0.0 standard drinks    Comment: socially  . Drug use: No    Family History  Problem Relation Age of Onset  . Hyperlipidemia Mother   . Mental retardation Mother   . Diabetes Mother   . Diabetes Father   . Hyperlipidemia Father   . Hypertension Father   . Diabetes Maternal Grandmother   . Diabetes Maternal Grandfather   . Diabetes Paternal Grandmother   . Diabetes Paternal Grandfather    Allergies  Allergen Reactions  . Metformin And Related Diarrhea and Nausea And Vomiting  . Doxycycline Hives    OBJECTIVE: Blood pressure 115/81, pulse 78, temperature 97.9 F (36.6 C), temperature source Oral, resp. rate 19, height 5\' 6"  (1.676 m), weight 83.5 kg, SpO2 98 %.  Physical Exam HENT:     Mouth/Throat:     Mouth: Mucous membranes are moist. No oral lesions.     Dentition: Normal dentition. No dental caries.     Pharynx: Oropharynx is clear. No oropharyngeal exudate.  Eyes:     General: No scleral icterus. Cardiovascular:     Rate and Rhythm: Normal rate and regular rhythm.     Heart sounds: Normal heart sounds.  Pulmonary:     Effort: Pulmonary effort is normal.     Breath sounds: Normal breath sounds.  Abdominal:     General: There is no distension.     Palpations: Abdomen is soft.     Tenderness: There is no abdominal tenderness.  Musculoskeletal:        General: Normal range of motion.  Lymphadenopathy:      Cervical: No cervical adenopathy.  Skin:    General: Skin is warm and dry.     Capillary Refill: Capillary refill takes less than 2 seconds.     Findings: No rash.     Comments: Dressings are clean and dry without shadowing.   Neurological:     Mental Status: He is alert and oriented to person, place, and time.    09/30/19     Lab Results Lab Results  Component Value Date   WBC 14.8 (H) 09/30/2019   HGB 11.3 (L) 09/30/2019   HCT 33.6 (L) 09/30/2019   MCV 91.3 09/30/2019   PLT  249 09/30/2019    Lab Results  Component Value Date   CREATININE 0.78 09/30/2019   BUN <5 (L) 09/30/2019   NA 139 09/30/2019   K 3.6 09/30/2019   CL 99 09/30/2019   CO2 27 09/30/2019    Lab Results  Component Value Date   ALT 11 09/29/2019   AST 15 09/29/2019   ALKPHOS 75 09/29/2019   BILITOT 0.9 09/29/2019     Microbiology: Recent Results (from the past 240 hour(s))  Respiratory Panel by RT PCR (Flu A&B, Covid) - Nasopharyngeal Swab     Status: None   Collection Time: 09/27/19  6:29 AM   Specimen: Nasopharyngeal Swab  Result Value Ref Range Status   SARS Coronavirus 2 by RT PCR NEGATIVE NEGATIVE Final    Comment: (NOTE) SARS-CoV-2 target nucleic acids are NOT DETECTED. The SARS-CoV-2 RNA is generally detectable in upper respiratoy specimens during the acute phase of infection. The lowest concentration of SARS-CoV-2 viral copies this assay can detect is 131 copies/mL. A negative result does not preclude SARS-Cov-2 infection and should not be used as the sole basis for treatment or other patient management decisions. A negative result may occur with  improper specimen collection/handling, submission of specimen other than nasopharyngeal swab, presence of viral mutation(s) within the areas targeted by this assay, and inadequate number of viral copies (<131 copies/mL). A negative result must be combined with clinical observations, patient history, and epidemiological information.  The expected result is Negative. Fact Sheet for Patients:  PinkCheek.be Fact Sheet for Healthcare Providers:  GravelBags.it This test is not yet ap proved or cleared by the [place] FDA and  has been authorized for detection and/or diagnosis of SARS-CoV-2 by FDA under an Emergency Use Authorization (EUA). This EUA will remain  in effect (meaning this test can be used) for the duration of the COVID-19 declaration under Section 564(b)(1) of the Act, 21 U.S.C. section 360bbb-3(b)(1), unless the authorization is terminated or revoked sooner.    Influenza A by PCR NEGATIVE NEGATIVE Final   Influenza B by PCR NEGATIVE NEGATIVE Final    Comment: (NOTE) The Xpert Xpress SARS-CoV-2/FLU/RSV assay is intended as an aid in  the diagnosis of influenza from Nasopharyngeal swab specimens and  should not be used as a sole basis for treatment. Nasal washings and  aspirates are unacceptable for Xpert Xpress SARS-CoV-2/FLU/RSV  testing. Fact Sheet for Patients: PinkCheek.be Fact Sheet for Healthcare Providers: GravelBags.it This test is not yet approved or cleared by the [place] FDA and  has been authorized for detection and/or diagnosis of SARS-CoV-2 by  FDA under an Emergency Use Authorization (EUA). This EUA will remain  in effect (meaning this test can be used) for the duration of the  Covid-19 declaration under Section 564(b)(1) of the Act, 21  U.S.C. section 360bbb-3(b)(1), unless the authorization is  terminated or revoked. Performed at [facility], [place] [address], [place], [place] [address]   Aerobic/Anaerobic Culture (surgical/deep wound)     Status: None (Preliminary result)   Collection Time: 09/27/19 11:00 AM   Specimen: Abscess  Result Value Ref Range Status   Specimen Description ABSCESS  Final   Special Requests NONE  Final   Gram Stain   Final     FEW WBC PRESENT,BOTH PMN AND MONONUCLEAR ABUNDANT GRAM POSITIVE COCCI IN PAIRS IN CLUSTERS MODERATE GRAM NEGATIVE RODS RARE GRAM POSITIVE RODS Performed at [facility], [place] [address], [place], [place] [address]    Culture  Final    MODERATE GROUP B STREP(S.AGALACTIAE)ISOLATED TESTING AGAINST S. AGALACTIAE NOT ROUTINELY PERFORMED DUE TO PREDICTABILITY OF AMP/PEN/VAN SUSCEPTIBILITY. MODERATE STREPTOCOCCUS CONSTELLATUS CULTURE REINCUBATED FOR BETTER GROWTH    Report Status PENDING  Incomplete  Aerobic/Anaerobic Culture (surgical/deep wound)     Status: None (Preliminary result)   Collection Time: 09/27/19 11:37 AM   Specimen: Abscess; Body Fluid  Result Value Ref Range Status   Specimen Description ABSCESS  Final   Special Requests NONE  Final   Gram Stain   Final    RARE WBC PRESENT, PREDOMINANTLY PMN FEW GRAM POSITIVE COCCI Performed at [facility], [address] [place], [place] [address]    Culture   Final    FEW STAPHYLOCOCCUS AUREUS NO ANAEROBES ISOLATED; CULTURE IN PROGRESS FOR 5 DAYS    Report Status PENDING  Incomplete   Organism ID, Bacteria STAPHYLOCOCCUS AUREUS  Final      Susceptibility   Staphylococcus aureus - MIC*    CIPROFLOXACIN <=0.5 SENSITIVE Sensitive     ERYTHROMYCIN <=0.25 SENSITIVE Sensitive     GENTAMICIN <=0.5 SENSITIVE Sensitive     OXACILLIN 0.5 SENSITIVE Sensitive     TETRACYCLINE <=1 SENSITIVE Sensitive     VANCOMYCIN 1 SENSITIVE Sensitive     TRIMETH/SULFA <=10 SENSITIVE Sensitive     CLINDAMYCIN <=0.25 SENSITIVE Sensitive     RIFAMPIN <=0.5 SENSITIVE Sensitive     Inducible Clindamycin NEGATIVE Sensitive     * FEW STAPHYLOCOCCUS AUREUS  Surgical pcr screen     Status: Abnormal   Collection Time: 09/29/19 11:00 AM   Specimen: Nasal Mucosa; Nasal Swab  Result Value Ref Range Status   MRSA, PCR NEGATIVE NEGATIVE Final   Staphylococcus aureus POSITIVE (A) NEGATIVE Final    Comment: (NOTE) The Xpert SA Assay (FDA approved for  NASAL specimens in patients 18 years of age and older), is one component of a comprehensive surveillance program. It is not intended to diagnose infection nor to guide or monitor treatment. Performed at [facility], [address], [place], [place] [address]     Rexene Alberts, MSN, NP-C [facility]  [email or web address] Pager: [phone] Office: [phone] [facility] Main Line: [phone]

## 2019-09-30 NOTE — Progress Notes (Signed)
Patient refused Potassium. MD notified.

## 2019-10-01 LAB — C-REACTIVE PROTEIN: CRP: 19.8 mg/dL — ABNORMAL HIGH (ref <1.0)

## 2019-10-01 LAB — GLUCOSE, CAPILLARY
Glucose-Capillary: 130 mg/dL — ABNORMAL HIGH (ref 70–99)
Glucose-Capillary: 148 mg/dL — ABNORMAL HIGH (ref 70–99)
Glucose-Capillary: 166 mg/dL — ABNORMAL HIGH (ref 70–99)
Glucose-Capillary: 84 mg/dL (ref 70–99)
Glucose-Capillary: 96 mg/dL (ref 70–99)

## 2019-10-01 LAB — AEROBIC/ANAEROBIC CULTURE W GRAM STAIN (SURGICAL/DEEP WOUND)

## 2019-10-01 LAB — CBC
HCT: 34.8 % — ABNORMAL LOW (ref 39.0–52.0)
Hemoglobin: 11.5 g/dL — ABNORMAL LOW (ref 13.0–17.0)
MCH: 30.4 pg (ref 26.0–34.0)
MCHC: 33 g/dL (ref 30.0–36.0)
MCV: 92.1 fL (ref 80.0–100.0)
Platelets: 258 10*3/uL (ref 150–400)
RBC: 3.78 MIL/uL — ABNORMAL LOW (ref 4.22–5.81)
RDW: 13.5 % (ref 11.5–15.5)
WBC: 12.4 10*3/uL — ABNORMAL HIGH (ref 4.0–10.5)
nRBC: 0.2 % (ref 0.0–0.2)

## 2019-10-01 LAB — SEDIMENTATION RATE: Sed Rate: 110 mm/hr — ABNORMAL HIGH (ref 0–16)

## 2019-10-01 MED ORDER — MUPIROCIN 2 % EX OINT
1.0000 "application " | TOPICAL_OINTMENT | Freq: Two times a day (BID) | CUTANEOUS | Status: DC
  Administered 2019-10-01 – 2019-10-04 (×7): 1 via NASAL
  Filled 2019-10-01: qty 22

## 2019-10-01 MED ORDER — METRONIDAZOLE 500 MG PO TABS
500.0000 mg | ORAL_TABLET | Freq: Three times a day (TID) | ORAL | Status: DC
  Administered 2019-10-01 – 2019-10-04 (×10): 500 mg via ORAL
  Filled 2019-10-01 (×10): qty 1

## 2019-10-01 MED ORDER — CHLORHEXIDINE GLUCONATE CLOTH 2 % EX PADS
6.0000 | MEDICATED_PAD | Freq: Every day | CUTANEOUS | Status: DC
  Administered 2019-10-01 – 2019-10-04 (×4): 6 via TOPICAL

## 2019-10-01 MED ORDER — INSULIN ASPART 100 UNIT/ML ~~LOC~~ SOLN
12.0000 [IU] | Freq: Three times a day (TID) | SUBCUTANEOUS | Status: DC
  Administered 2019-10-01 – 2019-10-04 (×9): 12 [IU] via SUBCUTANEOUS

## 2019-10-01 MED ORDER — SODIUM CHLORIDE 0.9 % IV SOLN
2.0000 g | INTRAVENOUS | Status: AC
  Administered 2019-10-01 – 2019-10-03 (×3): 2 g via INTRAVENOUS
  Filled 2019-10-01 (×3): qty 2

## 2019-10-01 NOTE — Progress Notes (Signed)
2 Days Post-Op  Subjective: CC: Notes pain with the dressing change. Mobilized some with PT yesterday. Tolerating diet.   Objective: Vital signs in last 24 hours: Temp:  [97.7 F (36.5 C)-98.2 F (36.8 C)] 98.2 F (36.8 C) (03/25 0332) Pulse Rate:  [72-96] 72 (03/25 0332) Resp:  [16-19] 16 (03/25 0332) BP: (115-138)/(70-87) 138/77 (03/25 0332) SpO2:  [98 %-100 %] 100 % (03/25 0332) Weight:  [83.2 kg] 83.2 kg (03/25 0332) Last BM Date: 09/28/19  Intake/Output from previous day: 03/24 0701 - 03/25 0700 In: 2030.9 [P.O.:635; I.V.:995.9; IV Piggyback:400] Out: 2300 [Urine:2300] Intake/Output this shift: No intake/output data recorded.  PE: Gen: Awake and alert, NAD Lungs: Normal rate and effort GU: MD also present, Please see picture below. Perineal and right groin wound with healthy appearing granulation tissue at base. There is some drainage noted in the wound. Skin surrounding without erythema, heat or induration.      Lab Results:  Recent Labs    09/30/19 0331 10/01/19 0607  WBC 14.8* 12.4*  HGB 11.3* 11.5*  HCT 33.6* 34.8*  PLT 249 258   BMET Recent Labs    09/29/19 0829 09/30/19 0331  NA 136 139  K 3.5 3.6  CL 102 99  CO2 22 27  GLUCOSE 211* 247*  BUN <5* <5*  CREATININE 0.74 0.78  CALCIUM 7.7* 8.5*   PT/INR No results for input(s): LABPROT, INR in the last 72 hours. CMP     Component Value Date/Time   NA 139 09/30/2019 0331   K 3.6 09/30/2019 0331   CL 99 09/30/2019 0331   CO2 27 09/30/2019 0331   GLUCOSE 247 (H) 09/30/2019 0331   BUN <5 (L) 09/30/2019 0331   CREATININE 0.78 09/30/2019 0331   CREATININE 0.78 06/05/2014 1155   CALCIUM 8.5 (L) 09/30/2019 0331   PROT 5.9 (L) 09/29/2019 0829   ALBUMIN 2.1 (L) 09/29/2019 0829   AST 15 09/29/2019 0829   ALT 11 09/29/2019 0829   ALKPHOS 75 09/29/2019 0829   BILITOT 0.9 09/29/2019 0829   GFRNONAA >60 09/30/2019 0331   GFRNONAA >89 06/05/2014 1155   GFRAA >60 09/30/2019 0331   GFRAA >89  06/05/2014 1155   Lipase     Component Value Date/Time   LIPASE 39 04/13/2018 0831       Studies/Results: No results found.  Anti-infectives: Anti-infectives (From admission, onward)   Start     Dose/Rate Route Frequency Ordered Stop   09/30/19 1200  Ampicillin-Sulbactam (UNASYN) 3 g in sodium chloride 0.9 % 100 mL IVPB     3 g 200 mL/hr over 30 Minutes Intravenous Every 6 hours 09/30/19 1104     09/27/19 2030  meropenem (MERREM) 1 g in sodium chloride 0.9 % 100 mL IVPB  Status:  Discontinued     1 g 200 mL/hr over 30 Minutes Intravenous Every 8 hours 09/27/19 1938 09/30/19 1104   09/27/19 2030  vancomycin (VANCOCIN) IVPB 1000 mg/200 mL premix  Status:  Discontinued     1,000 mg 200 mL/hr over 60 Minutes Intravenous Every 12 hours 09/27/19 1938 09/30/19 1104   09/27/19 2015  clindamycin (CLEOCIN) IVPB 600 mg  Status:  Discontinued     600 mg 100 mL/hr over 30 Minutes Intravenous Every 8 hours 09/27/19 1913 09/30/19 0741   09/27/19 0600  vancomycin (VANCOREADY) IVPB 1750 mg/350 mL     1,750 mg 175 mL/hr over 120 Minutes Intravenous  Once 09/27/19 0553 09/27/19 0823   09/27/19 0600  piperacillin-tazobactam (ZOSYN)  IVPB 3.375 g     3.375 g 100 mL/hr over 30 Minutes Intravenous  Once 09/27/19 0553 09/27/19 0621   09/27/19 0600  clindamycin (CLEOCIN) IVPB 900 mg     900 mg 100 mL/hr over 30 Minutes Intravenous  Once 09/27/19 0558 09/27/19 7026       Assessment/Plan DM2 w/ DKA on presentation  Perineal and right groin necrotizing soft tissue infection - POD #4 s/pirrigation and debridement of R perineum and thigh- Dr. Bedelia Person - 3/21 - POD #2 s/p debridement of skin, soft tissue rightgroin and perineummeasuring22x8x4 cm - Dr. Dwain Sarna - 3/23 -Cont abx. Cx with stap aureus and GBS. Await report for s/s for GBS - Increase WTD dressing changes BID - Mobilize. PT/OT rec no follow up  FEN -HH VTE -SCDs, okay for chemical prophylaxis from a general surgery  standpoint ID -Clinda 3/21-3/23. Merrem/Vanc 3/21 - 3/24. Unasyn 3/24 >Afebrile. WBC downtrending from 14.8 to 12.4 Foley - None  Follow-Up - TBD   LOS: 4 days    Jacinto Halim , [facility] Surgery 10/01/2019, 7:50 AM Please see Amion for pager number during day hours 7:00am-4:30pm

## 2019-10-01 NOTE — Progress Notes (Signed)
Physical Therapy Treatment Patient Details Name: Logan Weiss MRN: [id number] DOB: Aug 08, 1970 Today's Date: 10/01/2019    History of Present Illness This 49 y.o. male admitted with Rt buttock abcess.  He underwent I&D on 09/27/2019 and second debridement on 09/29/19.  PMH includes: uncontrolled DM, h/o MRSA infection    PT Comments    Pt reports pain but agreeable to PT.  Ambulated with wide BOS and antalgic pattern but otherwise steady.  Prefers to ambulate in room.  Need to incorporate stair training but pt denies at this time.    Follow Up Recommendations  No PT follow up     Equipment Recommendations  None recommended by PT    Recommendations for Other Services       Precautions / Restrictions Restrictions Weight Bearing Restrictions: No    Mobility  Bed Mobility Overal bed mobility: Needs Assistance Bed Mobility: Supine to Sit;Sit to Supine Rolling: Supervision   Supine to sit: HOB elevated;Supervision Sit to supine: HOB elevated;Supervision      Transfers Overall transfer level: Needs assistance Equipment used: None Transfers: Sit to/from Stand Sit to Stand: Supervision         General transfer comment: wide BOS and trunk flexion due to pain  Ambulation/Gait Ambulation/Gait assistance: Supervision Gait Distance (Feet): 200 Feet Assistive device: None Gait Pattern/deviations: Decreased stride length;Step-through pattern;Wide base of support Gait velocity: dec   General Gait Details: Declined going out in hall; ambulated with antalgic pattern and wide BOS due to pain   Stairs             Wheelchair Mobility    Modified Rankin (Stroke Patients Only)       Balance Overall balance assessment: Needs assistance Sitting-balance support: Bilateral upper extremity supported(too offload buttock) Sitting balance-Leahy Scale: Good     Standing balance support: No upper extremity supported Standing balance-Leahy Scale: Good                               Cognition Arousal/Alertness: Awake/alert Behavior During Therapy: WFL for tasks assessed/performed Overall Cognitive Status: Within Functional Limits for tasks assessed                                        Exercises      General Comments General comments (skin integrity, edema, etc.): VSS; encouraged ambulation and ROM as able      Pertinent Vitals/Pain Pain Assessment: Faces Faces Pain Scale: Hurts even more Pain Location: Rt buttock  Pain Descriptors / Indicators: Operative site guarding;Guarding Pain Intervention(s): Limited activity within patient's tolerance;Monitored during session;Repositioned    Home Living                      Prior Function            PT Goals (current goals can now be found in the care plan section) Acute Rehab PT Goals Patient Stated Goal: get back to work PT Goal Formulation: With patient Time For Goal Achievement: 10/12/19 Potential to Achieve Goals: Good Progress towards PT goals: Progressing toward goals    Frequency    Min 3X/week      PT Plan Current plan remains appropriate    Co-evaluation              AM-PAC PT "6 Clicks" Mobility   Outcome Measure  Help  needed turning from your back to your side while in a flat bed without using bedrails?: None Help needed moving from lying on your back to sitting on the side of a flat bed without using bedrails?: None Help needed moving to and from a bed to a chair (including a wheelchair)?: None Help needed standing up from a chair using your arms (e.g., wheelchair or bedside chair)?: None Help needed to walk in hospital room?: None Help needed climbing 3-5 steps with a railing? : A Little 6 Click Score: 23    End of Session Equipment Utilized During Treatment: Gait belt Activity Tolerance: Patient tolerated treatment well Patient left: in bed;with call bell/phone within reach;with family/visitor present Nurse Communication:  Mobility status PT Visit Diagnosis: Unsteadiness on feet (R26.81);Difficulty in walking, not elsewhere classified (R26.2)     Time: 0051-1021 PT Time Calculation (min) (ACUTE ONLY): 12 min  Charges:  $Gait Training: 8-22 mins                     Maggie Font, PT Acute Rehab Services Pager [phone] [facility] [phone] [facility] [phone]    Karlton Lemon 10/01/2019, 5:22 PM

## 2019-10-01 NOTE — Progress Notes (Signed)
OT Cancellation Note  Patient Details Name: Logan Weiss MRN: [id number] DOB: 1971-03-24   Cancelled Treatment:     Attempted to see patient x2 this AM. First attempt patient eating breakfast and reports buttock is throbbing. Second attempt patient sleeping soundly with spouse reporting patient just got up to the bathroom and "it was an ordeal," and is now resting. Will re-attempt as time permits.  Myrtie Neither OT OT office: [phone]   Carmelia Roller 10/01/2019, 11:04 AM

## 2019-10-01 NOTE — Progress Notes (Signed)
   Subjective:   He is having some more pain this morning with dressing changes. Discussed he has medication available currently for pain. He otherwise is doing well, denies nausea, fever, chills. All questions from pt and wife addressed.  Objective:  Vital signs in last 24 hours: Vitals:   09/30/19 1615 09/30/19 2155 10/01/19 0014 10/01/19 0332  BP: 125/87 129/70 124/72 138/77  Pulse: 86 74 72 72  Resp: 19  16 16   Temp: 97.7 F (36.5 C) 97.9 F (36.6 C) 97.8 F (36.6 C) 98.2 F (36.8 C)  TempSrc: Oral Oral Oral Oral  SpO2: 98% 98%  100%  Weight:    83.2 kg  Height:       Physical Exam Vitals and nursing note reviewed.  Constitutional:      General: He is not in acute distress.    Appearance: He is not ill-appearing.     Comments: Pt resting comfortably back in bed.  Pulmonary:     Effort: Pulmonary effort is normal.  Musculoskeletal:     Right lower leg: No edema.     Left lower leg: No edema.  Skin:    General: Skin is warm and dry.  Neurological:     Mental Status: He is alert.    Assessment/Plan:  Principal Problem:   Necrotizing soft tissue infection Active Problems:   Type 2 diabetes mellitus with hyperglycemia (HCC)   Abscess of multiple sites of perineum   Normocytic anemia  Mr. Logan Weiss is a 49 year old M with significant PMH of uncontrolled type II diabetes who presents with a R perineal abscess and soft tissue infection with subsequent hyperglycemia.  Necrotizing soft tissue infection R perineal abscess and soft tissue infection CT imaging of the pelvison 3/21revealed large area from the anteromedial R thigh to the posterior buttock with fluid and air and without discrete abscess consistent with an infectious process, nec fasc could not be excluded. - s/p I&D x 2 with an area of 22x8x4cm debrided  - vanc/merrem narrowed to unasym on 3/24 - completed 3 days of clinda on 3/23 - clinically doing much improved, afebrile and nontachycardic - WBC 8.4 >  11.4 > 14.8 > 12.4 - culture growing pan-sensitive staph aureus, GBS, and strep constellatus with intermediate sensitivity to penicillins - blood cultures with no growth to date  ID consulted, appreciate recommendations - change antibiotics to ceftriaxone - no need for echo at this time with strep constellatus soft tissue infection - plan to d/c antibiotics tomorrow   General surgery consulted, appreciate their input in this case - post op day 2 from debridement of skin/soft tissue/perineum and post-op day 4 from initial I&D - WBC improved, afebrile - wound without need for additional debridement - wound dressing changes BID - mobilize  DKAon presentation, sinceresolved. Uncontrolled type II DM A1c 13.3 on admission.  - Glc much improved, AM CBG 148 - continue lantus 30u daily, novoog 12u TID with meals, and resistant + qhs SSI  - pt will need levemir pens to start long acting insulin at discharge  Prior to Admission Living Arrangement: home Anticipated Discharge Location: home Barriers to Discharge: outpatient wound care coordination Dispo: Anticipated discharge home tomorrow   4/23, MD 10/01/2019, 6:43 AM Pager: [phone]

## 2019-10-01 NOTE — Progress Notes (Addendum)
[facility]  Date of Admission:  09/27/2019      Total days of antibiotics 6  Day 2 ampicillin-sulbactam           ASSESSMENT: Logan Weiss is a 49 y.o. male with polymicrobial necrotizing soft tissue infection of the right perineum and thigh. He seems to be improving clinically following serial surgeries and antibiotics. Will continue Unasyn through tomorrow and plan to D/C as long as there remains no ongoing concern from surgery.   Discussed importance of wound care as directed to keep the wound clean and encourage healing.  Blood sugar management as well.   Do not feel that he needs echocardiogram to evaluate at this time with Streptococcus constellatus soft tissue infection.   Not necessarily having constipation - wife is worried about Miralax being too strong --> ?consideration of colace for more gentle approach to avoid diarrhea. He is having a BM daily but the pain is more due to location of wounds not the act of passing.    PLAN: 1. Continue unasyn for now 2. Plan to D/C antimicrobials tomorrow  3. Consider Colace instead of Miralax    ADDENDUM: 1:12 PM  MIC from micro lab for streptococcus constellatus has returned intermediate @ 0.5 --> will change to ceftriaxone.     Principal Problem:   Necrotizing soft tissue infection Active Problems:   Abscess of multiple sites of perineum   Type 2 diabetes mellitus with hyperglycemia (HCC)   Normocytic anemia   . atorvastatin  20 mg Oral q1800  . Chlorhexidine Gluconate Cloth  6 each Topical Daily  . enoxaparin (LOVENOX) injection  40 mg Subcutaneous Q24H  . feeding supplement (PRO-STAT SUGAR FREE 64)  30 mL Oral BID  . insulin aspart  0-20 Units Subcutaneous TID WC  . insulin aspart  0-5 Units Subcutaneous QHS  . insulin aspart  12 Units Subcutaneous TID WC  . insulin glargine  30 Units Subcutaneous QHS  . multivitamin with minerals  1 tablet Oral Daily  . mupirocin ointment   1 application Nasal BID  . nutrition supplement (JUVEN)  1 packet Oral BID BM  . polyethylene glycol  17 g Oral Daily    SUBJECTIVE: In a lot of pain after trying to use the bathroom - patient is resting in bed quietly.  His wife, Logan Weiss is at the bedside filling me in as to how he is doing today.  Uneventful night - no chills or fevers.   POD#2 following  Second Debridement of R groin and perineum. Dressing change frequency increased per surgery team to BID due to slough in the wound bed (photo pictured below).    Review of Systems: Review of Systems  Constitutional: Negative for chills and fever.  Gastrointestinal: Vomiting:      Allergies  Allergen Reactions  . Metformin And Related Diarrhea and Nausea And Vomiting  . Doxycycline Hives    OBJECTIVE: Vitals:   10/01/19 0014 10/01/19 0332 10/01/19 0804 10/01/19 1157  BP: 124/72 138/77 138/79 105/64  Pulse: 72 72 73 84  Resp: 16 16 18 18   Temp: 97.8 F (36.6 C) 98.2 F (36.8 C) 98.6 F (37 C) 99.2 F (37.3 C)  TempSrc: Oral Oral Oral Oral  SpO2:  100% 99% 97%  Weight:  83.2 kg    Height:       Body mass index is 29.61 kg/m.  Physical Exam Constitutional:      Comments: Resting  in bed with covers up to face. In pain. Requested to defer exam at this time.   Pulmonary:     Effort: Pulmonary effort is normal.  Neurological:     Mental Status: He is oriented to person, place, and time.     CCS evaluation this AM - no purulence but some slough described. No odor described.      Lab Results Lab Results  Component Value Date   WBC 12.4 (H) 10/01/2019   HGB 11.5 (L) 10/01/2019   HCT 34.8 (L) 10/01/2019   MCV 92.1 10/01/2019   PLT 258 10/01/2019    Lab Results  Component Value Date   CREATININE 0.78 09/30/2019   BUN <5 (L) 09/30/2019   NA 139 09/30/2019   K 3.6 09/30/2019   CL 99 09/30/2019   CO2 27 09/30/2019    Lab Results  Component Value Date   ALT 11 09/29/2019   AST 15 09/29/2019   ALKPHOS  75 09/29/2019   BILITOT 0.9 09/29/2019     Microbiology: Recent Results (from the past 240 hour(s))  Respiratory Panel by RT PCR (Flu A&B, Covid) - Nasopharyngeal Swab     Status: None   Collection Time: 09/27/19  6:29 AM   Specimen: Nasopharyngeal Swab  Result Value Ref Range Status   SARS Coronavirus 2 by RT PCR NEGATIVE NEGATIVE Final    Comment: (NOTE) SARS-CoV-2 target nucleic acids are NOT DETECTED. The SARS-CoV-2 RNA is generally detectable in upper respiratoy specimens during the acute phase of infection. The lowest concentration of SARS-CoV-2 viral copies this assay can detect is 131 copies/mL. A negative result does not preclude SARS-Cov-2 infection and should not be used as the sole basis for treatment or other patient management decisions. A negative result may occur with  improper specimen collection/handling, submission of specimen other than nasopharyngeal swab, presence of viral mutation(s) within the areas targeted by this assay, and inadequate number of viral copies (<131 copies/mL). A negative result must be combined with clinical observations, patient history, and epidemiological information. The expected result is Negative. Fact Sheet for Patients:  PinkCheek.be Fact Sheet for Healthcare Providers:  GravelBags.it This test is not yet ap proved or cleared by the [place] FDA and  has been authorized for detection and/or diagnosis of SARS-CoV-2 by FDA under an Emergency Use Authorization (EUA). This EUA will remain  in effect (meaning this test can be used) for the duration of the COVID-19 declaration under Section 564(b)(1) of the Act, 21 U.S.C. section 360bbb-3(b)(1), unless the authorization is terminated or revoked sooner.    Influenza A by PCR NEGATIVE NEGATIVE Final   Influenza B by PCR NEGATIVE NEGATIVE Final    Comment: (NOTE) The Xpert Xpress SARS-CoV-2/FLU/RSV assay is intended as an aid  in  the diagnosis of influenza from Nasopharyngeal swab specimens and  should not be used as a sole basis for treatment. Nasal washings and  aspirates are unacceptable for Xpert Xpress SARS-CoV-2/FLU/RSV  testing. Fact Sheet for Patients: PinkCheek.be Fact Sheet for Healthcare Providers: GravelBags.it This test is not yet approved or cleared by the [place] FDA and  has been authorized for detection and/or diagnosis of SARS-CoV-2 by  FDA under an Emergency Use Authorization (EUA). This EUA will remain  in effect (meaning this test can be used) for the duration of the  Covid-19 declaration under Section 564(b)(1) of the Act, 21  U.S.C. section 360bbb-3(b)(1), unless the authorization is  terminated or revoked. Performed at [facility], [place]  [address] [place], [place] [address]   Fungus Culture With Stain     Status: None (Preliminary result)   Collection Time: 09/27/19 11:00 AM   Specimen: Abscess  Result Value Ref Range Status   Fungus Stain Final report  Final    Comment: (NOTE) Performed At: [facility] [address] [place], [place] [id number] Jolene Schimke MD EX:[id number]    Fungus (Mycology) Culture PENDING  Incomplete   Fungal Source ABSCESS  Final  Aerobic/Anaerobic Culture (surgical/deep wound)     Status: None (Preliminary result)   Collection Time: 09/27/19 11:00 AM   Specimen: Abscess  Result Value Ref Range Status   Specimen Description ABSCESS  Final   Special Requests NONE  Final   Gram Stain   Final    FEW WBC PRESENT,BOTH PMN AND MONONUCLEAR ABUNDANT GRAM POSITIVE COCCI IN PAIRS IN CLUSTERS MODERATE GRAM NEGATIVE RODS RARE GRAM POSITIVE RODS    Culture   Final    MODERATE GROUP B STREP(S.AGALACTIAE)ISOLATED TESTING AGAINST S. AGALACTIAE NOT ROUTINELY PERFORMED DUE TO PREDICTABILITY OF AMP/PEN/VAN SUSCEPTIBILITY. MODERATE STREPTOCOCCUS CONSTELLATUS CULTURE REINCUBATED FOR  BETTER GROWTH    Report Status PENDING  Incomplete   Organism ID, Bacteria STREPTOCOCCUS CONSTELLATUS  Final      Susceptibility   Streptococcus constellatus - MIC*    PENICILLIN 0.25 INTERMEDIATE Intermediate     CEFTRIAXONE 1 SENSITIVE Sensitive     ERYTHROMYCIN <=0.12 SENSITIVE Sensitive     LEVOFLOXACIN <=0.25 SENSITIVE Sensitive     VANCOMYCIN Value in next row Sensitive      0.5 SENSITIVEPerformed at [facility], [address] [place], [place] [address]    * MODERATE STREPTOCOCCUS CONSTELLATUS  Fungus Culture Result     Status: None   Collection Time: 09/27/19 11:00 AM  Result Value Ref Range Status   Result 1 Comment  Final    Comment: (NOTE) KOH/Calcofluor preparation:  no fungus observed. Performed At: [facility] [address] [place], [place] [id number] Jolene Schimke MD DP:[id number]   Fungus Culture With Stain     Status: None (Preliminary result)   Collection Time: 09/27/19 11:37 AM   Specimen: Abscess; Body Fluid  Result Value Ref Range Status   Fungus Stain Final report  Final    Comment: (NOTE) Performed At: [facility] [address] [place], [place] [id number] Jolene Schimke MD PY:[id number]    Fungus (Mycology) Culture PENDING  Incomplete   Fungal Source EAR  Final  Aerobic/Anaerobic Culture (surgical/deep wound)     Status: None (Preliminary result)   Collection Time: 09/27/19 11:37 AM   Specimen: Abscess; Body Fluid  Result Value Ref Range Status   Specimen Description ABSCESS  Final   Special Requests NONE  Final   Gram Stain   Final    RARE WBC PRESENT, PREDOMINANTLY PMN FEW GRAM POSITIVE COCCI Performed at [facility], [address], [place], [place] [address]    Culture   Final    FEW STAPHYLOCOCCUS AUREUS NO ANAEROBES ISOLATED; CULTURE IN PROGRESS FOR 5 DAYS    Report Status PENDING  Incomplete   Organism ID, Bacteria STAPHYLOCOCCUS AUREUS  Final      Susceptibility   Staphylococcus aureus - MIC*     CIPROFLOXACIN <=0.5 SENSITIVE Sensitive     ERYTHROMYCIN <=0.25 SENSITIVE Sensitive     GENTAMICIN <=0.5 SENSITIVE Sensitive     OXACILLIN 0.5 SENSITIVE Sensitive     TETRACYCLINE <=1 SENSITIVE Sensitive     VANCOMYCIN 1 SENSITIVE Sensitive  TRIMETH/SULFA <=10 SENSITIVE Sensitive     CLINDAMYCIN <=0.25 SENSITIVE Sensitive     RIFAMPIN <=0.5 SENSITIVE Sensitive     Inducible Clindamycin NEGATIVE Sensitive     * FEW STAPHYLOCOCCUS AUREUS  Fungus Culture Result     Status: None   Collection Time: 09/27/19 11:37 AM  Result Value Ref Range Status   Result 1 Comment  Final    Comment: (NOTE) KOH/Calcofluor preparation:  no fungus observed. Performed At: [facility] [address] [place], [place] [id number] Jolene Schimke MD EV:[id number]   Surgical pcr screen     Status: Abnormal   Collection Time: 09/29/19 11:00 AM   Specimen: Nasal Mucosa; Nasal Swab  Result Value Ref Range Status   MRSA, PCR NEGATIVE NEGATIVE Final   Staphylococcus aureus POSITIVE (A) NEGATIVE Final    Comment: (NOTE) The Xpert SA Assay (FDA approved for NASAL specimens in patients 22 years of age and older), is one component of a comprehensive surveillance program. It is not intended to diagnose infection nor to guide or monitor treatment. Performed at [facility], [address], [place], [place] [address]   Culture, blood (routine x 2)     Status: None (Preliminary result)   Collection Time: 09/30/19  9:18 AM   Specimen: BLOOD  Result Value Ref Range Status   Specimen Description BLOOD RIGHT ANTECUBITAL  Final   Special Requests   Final    BOTTLES DRAWN AEROBIC AND ANAEROBIC Blood Culture results may not be optimal due to an inadequate volume of blood received in culture bottles   Culture   Final    NO GROWTH < 24 HOURS Performed at [facility], [address], [place], [place] [address]    Report Status PENDING  Incomplete  Culture, blood (routine x 2)     Status: None  (Preliminary result)   Collection Time: 09/30/19  9:18 AM   Specimen: BLOOD RIGHT HAND  Result Value Ref Range Status   Specimen Description BLOOD RIGHT HAND  Final   Special Requests   Final    BOTTLES DRAWN AEROBIC AND ANAEROBIC Blood Culture adequate volume   Culture   Final    NO GROWTH < 24 HOURS Performed at [facility], [address], [place], [place] [address]    Report Status PENDING  Incomplete    Rexene Alberts, MSN, NP-C [facility] Cell: [phone] Pager: [phone]  @TODAY @ 12:49 PM

## 2019-10-02 LAB — GLUCOSE, CAPILLARY
Glucose-Capillary: 157 mg/dL — ABNORMAL HIGH (ref 70–99)
Glucose-Capillary: 293 mg/dL — ABNORMAL HIGH (ref 70–99)
Glucose-Capillary: 84 mg/dL (ref 70–99)
Glucose-Capillary: 277 mg/dL — ABNORMAL HIGH (ref 70–99)

## 2019-10-02 LAB — AEROBIC/ANAEROBIC CULTURE W GRAM STAIN (SURGICAL/DEEP WOUND)

## 2019-10-02 MED ORDER — OXYCODONE HCL 5 MG PO TABS
5.0000 mg | ORAL_TABLET | Freq: Once | ORAL | Status: AC
  Administered 2019-10-02: 5 mg via ORAL
  Filled 2019-10-02: qty 1

## 2019-10-02 NOTE — Progress Notes (Signed)
Occupational Therapy Treatment Patient Details Name: Logan Weiss MRN: [id number] DOB: [birth year] Today's Date: 10/02/2019    History of present illness This 49 y.o. male admitted with Rt buttock abcess.  He underwent I&D on 09/27/2019 and second debridement on 09/29/19.  PMH includes: uncontrolled DM, h/o MRSA infection   OT comments  Pt. Seen for skilled OT treatment session. Wife present for session also.  Pt. And wife educated and provided safe return demo of bed mobility and in room ambulation for standing grooming tasks.  Education also provided for LB B/D and caregiver education for safety during transfers and bed mobility.  Continue with LB ADLs and caregiver training next session.   Follow Up Recommendations  No OT follow up;Supervision - Intermittent    Equipment Recommendations  3 in 1 bedside commode    Recommendations for Other Services      Precautions / Restrictions Precautions Precautions: Fall Precaution Comments: mildly unsteady        Mobility Bed Mobility Overal bed mobility: Needs Assistance Bed Mobility: Supine to Sit;Sit to Sidelying Rolling: Supervision   Supine to sit: Supervision   Sit to sidelying: Supervision General bed mobility comments: hob flat no rails to simulate home environment.  will be using wife's side of bed the L side to get out.  S for all portions of in/out of bed.  also provided education on sitting close to hob prior to lying down so it will minimize scooting and repositioning in bed.  Also educated to stop and get bearings prior to standing after bed mobility.    Transfers                      Balance                                           ADL either performed or assessed with clinical judgement   ADL Overall ADL's : Needs assistance/impaired     Grooming: Supervision/safety;Standing;Oral care;Wash/dry face Grooming Details (indicate cue type and reason): pt. reports he was currently brushing  teeth in bed. at home he will not have a bed that can sit him up so we reviewed need to stand at sink since sitting is very painful.  pt. able to stand at sink to review groooming plans       Lower Body Bathing Details (indicate cue type and reason): education provided to wife and pt. on tech. and suggestions for completion.  she reports "weve been hesitant on how to get started".  discussed all UB should be fine for him to complete without assistance. even fronts of thighs and genitals.  may need assistance for below the knees.  him still not tolerating sitting for long increases her need to assist as he would also not be able to reach.                   Tub/Shower Transfer Details (indicate cue type and reason): wife reports tub with R facuet.  pt. again will be standing for shower with her providing intermittent assistance if there is an area he can not reach.  education provided on drying off before exiting the shower. Functional mobility during ADLs: Min guard General ADL Comments: pt. reports he was medicated prior to arrival.  eyes closed but participating when asked.  education provided to pt. and spouse during bed mobility and  ambulation for safe caregiver transfer tech. ie: not pulling on each others arms/shoulders.  reviewed during ambulation to watch hips for LOB not just lightly holding the wrist. wife said this education was very helpful.  reviewed bathing and grooming strategies.  may suggest hand held shower head at next session to increase pt. independence while bathing.  pt. and spouse report wife to have education and training tom. for dressing changes and that pt. will be having a shower.  reviewed OT to return tom. for any follow up and practice needed as they report d/c likely over the weekend     Vision       Perception     Praxis      Cognition Arousal/Alertness: Lethargic Behavior During Therapy: [facility] for tasks assessed/performed Overall Cognitive Status: Within  Functional Limits for tasks assessed                                          Exercises     Shoulder Instructions       General Comments      Pertinent Vitals/ Pain       Pain Assessment: No/denies pain Pain Intervention(s): Premedicated before session  Home Living                                          Prior Functioning/Environment              Frequency  Min 2X/week        Progress Toward Goals  OT Goals(current goals can now be found in the care plan section)  Progress towards OT goals: Progressing toward goals     Plan Discharge plan remains appropriate    Co-evaluation                 AM-PAC OT "6 Clicks" Daily Activity     Outcome Measure   Help from another person eating meals?: None Help from another person taking care of personal grooming?: A Little Help from another person toileting, which includes using toliet, bedpan, or urinal?: A Little Help from another person bathing (including washing, rinsing, drying)?: A Lot Help from another person to put on and taking off regular upper body clothing?: A Little Help from another person to put on and taking off regular lower body clothing?: A Lot 6 Click Score: 17    End of Session    OT Visit Diagnosis: Pain Pain - Right/Left: Right Pain - part of body: Hip   Activity Tolerance Patient tolerated treatment well   Patient Left in bed;with call bell/phone within reach;with family/visitor present   Nurse Communication          Time: 0973-5329 OT Time Calculation (min): 20 min  Charges: OT General Charges $OT Visit: 1 Visit OT Treatments $Self Care/Home Management : 8-22 mins  Logan Weiss, COTA/L Acute Rehabilitation [phone]   Logan Weiss 10/02/2019, 12:50 PM

## 2019-10-02 NOTE — Progress Notes (Signed)
   Subjective: Pt seen at the bedside with his wife this AM. Feeling okay, denies abdominal pain/fever/nausea. Wife was at the bedside for dressing changes this AM, states she will be able to complete them at home. Discussed anticipating discharge over the weekend and need for lantus and BID dressing changes at home. Pt and wife reluctant but overall accepting in this. Instructed them to continue education in regards to dressing changes and insulin with nursing staff while here.  Objective:  Vital signs in last 24 hours: Vitals:   10/01/19 1650 10/01/19 2035 10/01/19 2343 10/02/19 0454  BP: 121/71 108/83 138/77 127/78  Pulse: 84 72 72 81  Resp: 20 19 15 17   Temp: 99.4 F (37.4 C) 98.3 F (36.8 C) 98.5 F (36.9 C) 98.6 F (37 C)  TempSrc: Oral Oral Oral Oral  SpO2: 93% 97% 97% 98%  Weight:    82.1 kg  Height:       Physical Exam Vitals and nursing note reviewed.  Constitutional:      General: He is not in acute distress.    Appearance: He is not ill-appearing.  Pulmonary:     Effort: Pulmonary effort is normal.  Skin:    General: Skin is warm and dry.  Neurological:     Mental Status: He is alert.    Assessment/Plan:  Principal Problem:   Necrotizing soft tissue infection Active Problems:   Type 2 diabetes mellitus with hyperglycemia (HCC)   Abscess of multiple sites of perineum   Normocytic anemia  Logan Weiss is a 49 year old M with significant PMH of uncontrolled type II diabetes who presents with a R perineal abscess and soft tissue infection with subsequent hyperglycemia.  Necrotizing soft tissue infection R perineal abscess and soft tissue infection CT imaging of the pelvison 3/21revealed large area from the anteromedial R thigh to the posterior buttock with fluid and air and without discrete abscess consistent with an infectious process, nec fasc could not be excluded. Completed 3 days of clinda on 3/23. - s/p I&D x 2 with an area of 22x8x4cm debrided  -  afebrile - vanc/merrem narrowed to unasyn on 3/24 - unasyn to ceftriaxone/metronidazole on 3/25 - culture growing pan-sensitive staph aureus, GBS, and strep constellatus with intermediate sensitivity to penicillins - blood cultures with no growth in 2 days  ID consulted, appreciate recommendations - ceftriaxone + metronidazole until discharge - wound care and diabetic control will be the mainstay of treatment thereafter  General surgery consulted, appreciate their input in this case - post op day 3 from debridement of skin/soft tissue/perineum and post-op day 5 from initial I&D - wound with slough but nothing needing additional debridement - continue BID dressing changes - d/c over the weekend - outpatient follow-up with Dr. 4/25 presentation, sinceresolved. Uncontrolled type II DM A1c 13.3 on admission.  - AM glc 157 - continue lantus 30u daily, novoog 12u TID with meals, and resistant + qhs SSI  - pt will need levemir pens to start long acting insulin at discharge  Prior to Admission Living Arrangement: home Anticipated Discharge Location: home Barriers to Discharge: clinical course Dispo: Anticipated discharge in approximately 1-2 day(s).   Logan James, MD 10/02/2019, 7:27 AM Pager: [phone]

## 2019-10-02 NOTE — Progress Notes (Signed)
3 Days Post-Op  Subjective: CC: Reports less pain with dressing changes each day.   Objective: Vital signs in last 24 hours: Temp:  [98.3 F (36.8 C)-99.4 F (37.4 C)] 98.5 F (36.9 C) (03/26 0814) Pulse Rate:  [72-93] 93 (03/26 0814) Resp:  [15-20] 18 (03/26 0814) BP: (105-138)/(64-83) 123/80 (03/26 0814) SpO2:  [93 %-98 %] 96 % (03/26 0814) Weight:  [82.1 kg] 82.1 kg (03/26 0454) Last BM Date: 10/01/19  Intake/Output from previous day: 03/25 0701 - 03/26 0700 In: 1188.9 [P.O.:150; I.V.:938.9; IV Piggyback:100] Out: 2100 [Urine:2100] Intake/Output this shift: No intake/output data recorded.  PE: Gen: Awake and alert, NAD Lungs: Normal rate and effort GU: MD also present,Please see picture below. Perineal and right groin wound with healthy appearing granulation tissue at base. Minimal drainage noted in the wound. Skin surrounding without erythema, heat or induration.      Lab Results:  Recent Labs    09/30/19 0331 10/01/19 0607  WBC 14.8* 12.4*  HGB 11.3* 11.5*  HCT 33.6* 34.8*  PLT 249 258   BMET Recent Labs    09/30/19 0331  NA 139  K 3.6  CL 99  CO2 27  GLUCOSE 247*  BUN <5*  CREATININE 0.78  CALCIUM 8.5*   PT/INR No results for input(s): LABPROT, INR in the last 72 hours. CMP     Component Value Date/Time   NA 139 09/30/2019 0331   K 3.6 09/30/2019 0331   CL 99 09/30/2019 0331   CO2 27 09/30/2019 0331   GLUCOSE 247 (H) 09/30/2019 0331   BUN <5 (L) 09/30/2019 0331   CREATININE 0.78 09/30/2019 0331   CREATININE 0.78 06/05/2014 1155   CALCIUM 8.5 (L) 09/30/2019 0331   PROT 5.9 (L) 09/29/2019 0829   ALBUMIN 2.1 (L) 09/29/2019 0829   AST 15 09/29/2019 0829   ALT 11 09/29/2019 0829   ALKPHOS 75 09/29/2019 0829   BILITOT 0.9 09/29/2019 0829   GFRNONAA >60 09/30/2019 0331   GFRNONAA >89 06/05/2014 1155   GFRAA >60 09/30/2019 0331   GFRAA >89 06/05/2014 1155   Lipase     Component Value Date/Time   LIPASE 39 04/13/2018 0831        Studies/Results: No results found.  Anti-infectives: Anti-infectives (From admission, onward)   Start     Dose/Rate Route Frequency Ordered Stop   10/01/19 1800  cefTRIAXone (ROCEPHIN) 2 g in sodium chloride 0.9 % 100 mL IVPB     2 g 200 mL/hr over 30 Minutes Intravenous Every 24 hours 10/01/19 1315     10/01/19 1630  metroNIDAZOLE (FLAGYL) tablet 500 mg     500 mg Oral Every 8 hours 10/01/19 1609     09/30/19 1200  Ampicillin-Sulbactam (UNASYN) 3 g in sodium chloride 0.9 % 100 mL IVPB  Status:  Discontinued     3 g 200 mL/hr over 30 Minutes Intravenous Every 6 hours 09/30/19 1104 10/01/19 1315   09/27/19 2030  meropenem (MERREM) 1 g in sodium chloride 0.9 % 100 mL IVPB  Status:  Discontinued     1 g 200 mL/hr over 30 Minutes Intravenous Every 8 hours 09/27/19 1938 09/30/19 1104   09/27/19 2030  vancomycin (VANCOCIN) IVPB 1000 mg/200 mL premix  Status:  Discontinued     1,000 mg 200 mL/hr over 60 Minutes Intravenous Every 12 hours 09/27/19 1938 09/30/19 1104   09/27/19 2015  clindamycin (CLEOCIN) IVPB 600 mg  Status:  Discontinued     600 mg 100 mL/hr over  30 Minutes Intravenous Every 8 hours 09/27/19 1913 09/30/19 0741   09/27/19 0600  vancomycin (VANCOREADY) IVPB 1750 mg/350 mL     1,750 mg 175 mL/hr over 120 Minutes Intravenous  Once 09/27/19 0553 09/27/19 0823   09/27/19 0600  piperacillin-tazobactam (ZOSYN) IVPB 3.375 g     3.375 g 100 mL/hr over 30 Minutes Intravenous  Once 09/27/19 0553 09/27/19 0621   09/27/19 0600  clindamycin (CLEOCIN) IVPB 900 mg     900 mg 100 mL/hr over 30 Minutes Intravenous  Once 09/27/19 0558 09/27/19 0175       Assessment/Plan DM2 w/ DKA on presentation  Perineal and right groin necrotizing soft tissue infection -POD #5s/pirrigation and debridement of R perineum and thigh- Dr. Bedelia Person - 3/21 - POD #3 s/pdebridement of skin, soft tissue rightgroin and perineummeasuring22x8x4 cm - Dr. Dwain Sarna - 3/23 -Cont abx per ID. Cx  with stap aureus and GBS.  -Cont WTD dressing changes BID. Wife was present for dressing change teaching this AM and feels comfortable doing this at home.  - Mobilize. PT/OT rec no follow up - Patient may shower  FEN -HH VTE -SCDs, okay for chemical prophylaxis from a general surgery standpoint ID -Clinda3/21-3/23.Merrem/Vanc 3/21 - 3/24. Unasyn 3/24. Ropcehin/Flagyl 3/25 >>. Afebrile. WBC downtrending from 14.8 to 12.4  Foley - None  Follow-Up - Dr. Dwain Sarna   Plan: Wound is healing well. No indication for further surgical debridement. Cont abx per ID. Tolerating dressing changes with oral medications only. Wife will change dressings at home. Follow up with Dr. Dwain Sarna as outpatient. Suspect home over the weekend.    LOS: 5 days    Jacinto Halim , [facility] Surgery 10/02/2019, 9:39 AM Please see Amion for pager number during day hours 7:00am-4:30pm

## 2019-10-02 NOTE — Discharge Instructions (Signed)
Preventing Diabetes Mellitus Complications You can take action to prevent or slow down problems that are caused by diabetes (diabetes mellitus). Following your diabetes plan and taking care of yourself can reduce your risk of serious or life-threatening complications. What actions can I take to prevent diabetes complications? Manage your diabetes   Follow instructions from your health care providers about managing your diabetes. Your diabetes may be managed by a team of health care providers who can teach you how to care for yourself and can answer questions that you have.  Educate yourself about your condition so you can make healthy choices about eating and physical activity.  Check your blood sugar (glucose) levels as often as directed. Your health care provider will help you decide how often to check your blood glucose level depending on your treatment goals and how well you are meeting them.  Ask your health care provider if you should take low-dose aspirin daily and what dose is recommended for you. Taking low-dose aspirin daily is recommended to help prevent cardiovascular disease. Do not use nicotine or tobacco Do not use any products that contain nicotine or tobacco, such as cigarettes and e-cigarettes. If you need help quitting, ask your health care provider. Nicotine raises your risk for diabetes problems. If you quit using nicotine:  You will lower your risk for heart attack, stroke, nerve disease, and kidney disease.  Your cholesterol and blood pressure may improve.  Your blood circulation will improve. Keep your blood pressure under control Your personal target blood pressure is determined based on:  Your age.  Your medicines.  How long you have had diabetes.  Any other medical conditions you have. To control your blood pressure:  Follow instructions from your health care provider about meal planning, exercise, and medicines.  Make sure your health care provider  checks your blood pressure at every medical visit.  Monitor your blood pressure at home as told by your health care provider.  Keep your cholesterol under control To control your cholesterol:  Follow instructions from your health care provider about meal planning, exercise, and medicines.  Have your cholesterol checked at least once a year.  You may be prescribed medicine to lower cholesterol (statin). If you are not taking a statin, ask your health care provider if you should be. Controlling your cholesterol may:  Help prevent heart disease and stroke. These are the most common health problems for people with diabetes.  Improve your blood flow. Schedule and keep yearly physical exams and eye exams Your health care provider will tell you how often you need medical visits depending on your diabetes management plan. Keep all follow-up visits as directed. This is important so possible problems can be identified early and complications can be avoided or treated.  Every visit with your health care provider should include measuring your: ? Weight. ? Blood pressure. ? Blood glucose control.  Your A1c (hemoglobin A1c) level should be checked: ? At least 2 times a year, if you are meeting your treatment goals. ? 4 times a year, if you are not meeting treatment goals or if your treatment goals have changed.  Your blood lipids (lipid profile) should be checked yearly. You should also be checked yearly for protein in your urine (urine microalbumin).  If you have type 1 diabetes, get an eye exam 3-5 years after you are diagnosed, and then once a year after your first exam.  If you have type 2 diabetes, get an eye exam as soon as you  are diagnosed, and then once a year after your first exam. Keep your vaccines current It is recommended that you receive:  A flu (influenza) vaccine every year.  A pneumonia (pneumococcal) vaccine and a hepatitis B vaccine. If you are age 48 or older, you may  get the pneumonia vaccine as a series of two separate shots. Ask your health care provider which other vaccines may be recommended. Take care of your feet Diabetes may cause you to have poor blood circulation to your legs and feet. Because of this, taking care of your feet is very important. Diabetes can cause:  The skin on the feet to get thinner, break more easily, and heal more slowly.  Nerve damage in your legs and feet, which results in decreased feeling. You may not notice minor injuries that could lead to serious problems. To avoid foot problems:  Check your skin and feet every day for cuts, bruises, redness, blisters, or sores.  Schedule a foot exam with your health care provider once every year. This exam includes: ? Inspecting of the structure and skin of your feet. ? Checking the pulses and sensation in your feet.  Make sure that your health care provider performs a visual foot exam at every medical visit.  Take care of your teeth People with poorly controlled diabetes are more likely to have gum (periodontal) disease. Diabetes can make periodontal diseases harder to control. If not treated, periodontal diseases can lead to tooth loss. To prevent this:  Brush your teeth twice a day.  Floss at least once a day.  Visit your dentist 2 times a year. Drink responsibly Limit alcohol intake to no more than 1 drink a day for nonpregnant women and 2 drinks a day for men. One drink equals 12 oz of beer, 5 oz of wine, or 1 oz of hard liquor.  It is important to eat food when you drink alcohol to avoid low blood glucose (hypoglycemia). Avoid alcohol if you:  Have a history of alcohol abuse or dependence.  Are pregnant.  Have liver disease, pancreatitis, advanced neuropathy, or severe hypertriglyceridemia. Lessen stress Living with diabetes can be stressful. When you are experiencing stress, your blood glucose may be affected in two ways:  Stress hormones may cause your blood  glucose to rise.  You may be distracted from taking good care of yourself. Be aware of your stress level and make changes to help you manage challenging situations. To lower your stress levels:  Consider joining a support group.  Do planned relaxation or meditation.  Do a hobby that you enjoy.  Maintain healthy relationships.  Exercise regularly.  Work with your health care provider or a mental health professional. Summary  You can take action to prevent or slow down problems that are caused by diabetes (diabetes mellitus). Following your diabetes plan and taking care of yourself can reduce your risk of serious or life-threatening complications.  Follow instructions from your health care providers about managing your diabetes. Your diabetes may be managed by a team of health care providers who can teach you how to care for yourself and can answer questions that you have.  Your health care provider will tell you how often you need medical visits depending on your diabetes management plan. Keep all follow-up visits as directed. This is important so possible problems can be identified early and complications can be avoided or treated. This information is not intended to replace advice given to you by your health care provider. Make sure  you discuss any questions you have with your health care provider. Document Revised: 09/23/2017 Document Reviewed: 03/24/2016 Elsevier Patient Education  [place]. Blood Glucose Monitoring, Adult Monitoring your blood sugar (glucose) is an important part of managing your diabetes (diabetes mellitus). Blood glucose monitoring involves checking your blood glucose as often as directed and keeping a record (log) of your results over time. Checking your blood glucose regularly and keeping a blood glucose log can:  Help you and your health care provider adjust your diabetes management plan as needed, including your medicines or insulin.  Help you  understand how food, exercise, illnesses, and medicines affect your blood glucose.  Let you know what your blood glucose is at any time. You can quickly find out if you have low blood glucose (hypoglycemia) or high blood glucose (hyperglycemia). Your health care provider will set individualized treatment goals for you. Your goals will be based on your age, other medical conditions you have, and how you respond to diabetes treatment. Generally, the goal of treatment is to maintain the following blood glucose levels:  Before meals (preprandial): 80-130 mg/dL (4.4-7.2 mmol/L).  After meals (postprandial): below 180 mg/dL (10 mmol/L).  A1c level: less than 7%. Supplies needed:  Blood glucose meter.  Test strips for your meter. Each meter has its own strips. You must use the strips that came with your meter.  A needle to prick your finger (lancet). Do not use a lancet more than one time.  A device that holds the lancet (lancing device).  A journal or log book to write down your results. How to check your blood glucose  1. Wash your hands with soap and water. 2. Prick the side of your finger (not the tip) with the lancet. Use a different finger each time. 3. Gently rub the finger until a small drop of blood appears. 4. Follow instructions that come with your meter for inserting the test strip, applying blood to the strip, and using your blood glucose meter. 5. Write down your result and any notes. Some meters allow you to use areas of your body other than your finger (alternative sites) to test your blood. The most common alternative sites are:  Forearm.  Thigh.  Palm of the hand. If you think you may have hypoglycemia, or if you have a history of not knowing when your blood glucose is getting low (hypoglycemia unawareness), do not use alternative sites. Use your finger instead. Alternative sites may not be as accurate as the fingers, because blood flow is slower in these areas. This  means that the result you get may be delayed, and it may be different from the result that you would get from your finger. Follow these instructions at home: Blood glucose log   Every time you check your blood glucose, write down your result. Also write down any notes about things that may be affecting your blood glucose, such as your diet and exercise for the day. This information can help you and your health care provider: ? Look for patterns in your blood glucose over time. ? Adjust your diabetes management plan as needed.  Check if your meter allows you to download your records to a computer. Most glucose meters store a record of glucose readings in the meter. If you have type 1 diabetes:  Check your blood glucose 2 or more times a day.  Also check your blood glucose: ? Before every insulin injection. ? Before and after exercise. ? Before meals. ? 2  hours after a meal. ? Occasionally between 2:00 a.m. and 3:00 a.m., as directed. ? Before potentially dangerous tasks, like driving or using heavy machinery. ? At bedtime.  You may need to check your blood glucose more often, up to 6-10 times a day, if you: ? Use an insulin pump. ? Need multiple daily injections (MDI). ? Have diabetes that is not well-controlled. ? Are ill. ? Have a history of severe hypoglycemia. ? Have hypoglycemia unawareness. If you have type 2 diabetes:  If you take insulin or other diabetes medicines, check your blood glucose 2 or more times a day.  If you are on intensive insulin therapy, check your blood glucose 4 or more times a day. Occasionally, you may also need to check between 2:00 a.m. and 3:00 a.m., as directed.  Also check your blood glucose: ? Before and after exercise. ? Before potentially dangerous tasks, like driving or using heavy machinery.  You may need to check your blood glucose more often if: ? Your medicine is being adjusted. ? Your diabetes is not well-controlled. ? You are  ill. General tips  Always keep your supplies with you.  If you have questions or need help, all blood glucose meters have a 24-hour "hotline" phone number that you can call. You may also contact your health care provider.  After you use a few boxes of test strips, adjust (calibrate) your blood glucose meter by following instructions that came with your meter. Contact a health care provider if:  Your blood glucose is at or above 240 mg/dL (13.3 mmol/L) for 2 days in a row.  You have been sick or have had a fever for 2 days or longer, and you are not getting better.  You have any of the following problems for more than 6 hours: ? You cannot eat or drink. ? You have nausea or vomiting. ? You have diarrhea. Get help right away if:  Your blood glucose is lower than 54 mg/dL (3 mmol/L).  You become confused or you have trouble thinking clearly.  You have difficulty breathing.  You have moderate or large ketone levels in your urine. Summary  Monitoring your blood sugar (glucose) is an important part of managing your diabetes (diabetes mellitus).  Blood glucose monitoring involves checking your blood glucose as often as directed and keeping a record (log) of your results over time.  Your health care provider will set individualized treatment goals for you. Your goals will be based on your age, other medical conditions you have, and how you respond to diabetes treatment.  Every time you check your blood glucose, write down your result. Also write down any notes about things that may be affecting your blood glucose, such as your diet and exercise for the day. This information is not intended to replace advice given to you by your health care provider. Make sure you discuss any questions you have with your health care provider. Document Revised: 04/18/2018 Document Reviewed: 12/05/2015 Elsevier Patient Education  [place]. Hemoglobin A1c Test Why am I having this test? You may  have the hemoglobin A1c test (HbA1c test) done to:  Evaluate your risk for developing diabetes (diabetes mellitus).  Diagnose diabetes.  Monitor long-term control of blood sugar (glucose) in people who have diabetes and help make treatment decisions. This test may be done with other blood glucose tests, such as fasting blood glucose and oral glucose tolerance tests. What is being tested? Hemoglobin is a type of protein in the blood  that carries oxygen. Glucose attaches to hemoglobin to form glycated hemoglobin. This test checks the amount of glycated hemoglobin in your blood, which is a good indicator of the average amount of glucose in your blood during the past 2-3 months. What kind of sample is taken?  A blood sample is required for this test. It is usually collected by inserting a needle into a blood vessel. Tell a health care provider about:  All medicines you are taking, including vitamins, herbs, eye drops, creams, and over-the-counter medicines.  Any blood disorders you have.  Any surgeries you have had.  Any medical conditions you have.  Whether you are pregnant or may be pregnant. How are the results reported? Your results will be reported as a percentage that indicates how much of your hemoglobin has glucose attached to it (is glycated). Your health care provider will compare your results to normal ranges that were established after testing a large group of people (reference ranges). Reference ranges may vary among labs and hospitals. For this test, common reference ranges are:  Adult or child without diabetes: 4-5.6%.  Adult or child with diabetes and good blood glucose control: less than 7%. What do the results mean? If you have diabetes:  A result of less than 7% is considered normal, meaning that your blood glucose is well controlled.  A result higher than 7% means that your blood glucose is not well controlled, and your treatment plan may need to be adjusted. If  you do not have diabetes:  A result within the reference range is considered normal, meaning that you are not at high risk for diabetes.  A result of 5.7-6.4% means that you have a high risk of developing diabetes, and you may have prediabetes. Prediabetes is the condition of having a blood glucose level that is higher than it should be, but not high enough for you to be diagnosed with diabetes. Having prediabetes puts you at risk for developing type 2 diabetes (type 2 diabetes mellitus). You may have more tests, including a repeat HbA1c test.  Results of 6.5% or higher on two separate HbA1c tests mean that you have diabetes. You may have more tests to confirm the diagnosis. Abnormally low HbA1c values may be caused by:  Pregnancy.  Severe blood loss.  Receiving donated blood (transfusions).  Low red blood cell count (anemia).  Long-term kidney failure.  Some unusual forms (variants) of hemoglobin. Talk with your health care provider about what your results mean. Questions to ask your health care provider Ask your health care provider, or the department that is doing the test:  When will my results be ready?  How will I get my results?  What are my treatment options?  What other tests do I need?  What are my next steps? Summary  The hemoglobin A1c test (HbA1c test) may be done to evaluate your risk for developing diabetes, to diagnose diabetes, and to monitor long-term control of blood sugar (glucose) in people who have diabetes and help make treatment decisions.  Hemoglobin is a type of protein in the blood that carries oxygen. Glucose attaches to hemoglobin to form glycated hemoglobin. This test checks the amount of glycated hemoglobin in your blood, which is a good indicator of the average amount of glucose in your blood during the past 2-3 months.  Talk with your health care provider about what your results mean. This information is not intended to replace advice given to  you by your health care provider. Make  sure you discuss any questions you have with your health care provider. Document Revised: 06/07/2017 Document Reviewed: 02/05/2017 Elsevier Patient Education  [place]. Insulin Injection Instructions, Single Insulin Dose, Adult A subcutaneous injection is a shot of medicine that is injected into the layer of fat and tissue between skin and muscle. People with type 1 diabetes must take insulin because their bodies do not make it. People with type 2 diabetes may need to take insulin.  There are many different types of insulin. The type of insulin that you take may determine how many injections you give yourself and when you need to give the injections. Supplies needed:  Soap and water to wash hands.  A new, unused insulin syringe.  Your insulin medication bottle (vial).  Alcohol wipes.  A disposal container that is meant for sharp items (sharps container), such as an empty plastic bottle with a cover. How to choose a site for injection The body absorbs insulin differently, depending on where the insulin is injected (injection site). It is best to inject insulin into the same body area each time (for example, always in the abdomen), but you should use a different spot in that area for each injection. Do not inject the insulin in the same spot each time. There are five main areas that can be used for injecting. These areas include:  Abdomen. This is the preferred area.  Front of thigh.  Upper, outer side of thigh.  Upper, outer side of arm.  Upper, outer part of buttock. How to give a single-dose insulin injection First, follow the steps for Get ready, then continue with the steps for Push air into the vial, then follow the steps for Fill the syringe, and finish with the steps for Inject the insulin. Get ready 1. Wash your hands with soap and water. If soap and water are not available, use hand sanitizer. 2. Before you give yourself an  insulin injection, be sure to test your blood sugar level (blood glucose level) and write down that number. Follow any instructions from your health care provider about what to do if your blood glucose level is higher or lower than your normal range. 3. Use a new, unused insulin syringe each time you need to inject insulin. 4. Check to make sure you have the correct type of insulin syringe for the concentration of insulin that you are using. 5. Check the expiration date and the type of insulin that you are using. 6. If you are using CLEAR insulin, check to see that it is clear and free of clumps. 7. Do not shake the vial to get it ready. Gently roll the vial between your palms several times. 8. Remove the plastic pop-top covering from the vial of insulin. This type of covering is present on a vial when it is new. 9. Use an alcohol wipe to clean the rubber top of the vial. 10. Remove the plastic cover from the syringe needle. Do not let the needle touch anything. Push air into the vial 1. To bring (draw up) air into the syringe, slowly pull back on the syringe plunger. Stop pulling the plunger when the dose indicator gets to the number of units that you will be using. 2. While you keep the vial right-side-up, poke the needle through the rubber top of the vial. Do not turn the vial upside down to do this. 3. Push the plunger all the way into the syringe. Doing that will push air into the vial. 4.  Do not take the needle out of the vial yet. Fill the syringe  1. While the needle is still in the vial, turn the vial upside down and hold it at eye level. 2. Slowly pull back on the plunger. Stop pulling the plunger when the dose indicator gets to the desired number of units. 3. If you see air bubbles in the syringe, slowly move the plunger up and down 2 or 3 times to make them go away. ? If you had to move the plunger to get rid of air bubbles, pull back the plunger until the dose indicator returns to the  correct dose. 4. Remove the needle from the vial. Do not let the needle touch anything. Inject the insulin  1. Use an alcohol wipe to clean the site where you will be injecting the needle. Let the site air-dry. 2. Hold the syringe in your writing hand like a pencil. 3. Use your other hand to pinch and hold about an inch (2.5 cm) of skin. Do not directly touch the cleaned part of the skin. 4. Gently but quickly, put the needle straight into the skin. The needle should be at a 90-degree angle (perpendicular) to the skin. 5. Push the needle in as far as it will go (to the hub). 6. When the needle is completely inserted into the skin, use your thumb or index finger of your writing hand to push the plunger all the way into the syringe to inject the insulin. 7. Let go of the skin that you are pinching. Continue to hold the syringe in place with your writing hand. 8. Wait 10 seconds, then pull the needle straight out of the skin. This will allow all of the insulin to go from the syringe and needle into your body. 9. Press and hold the alcohol wipe over the injection site until any bleeding stops. Do not rub the area. 10. Do not put the plastic cover back on the needle. 11. Discard the syringe and needle directly into a sharps container, such as an empty plastic bottle with a cover. How to throw away supplies  Discard all used needles in a puncture-proof sharps disposal container. You can ask your local pharmacy about where you can get this kind of disposal container, or you can use an empty plastic liquid laundry detergent bottle that has a cover.  Follow the disposal regulations for the area where you live. Do not use any syringe or needle more than one time.  Throw away empty vials in the regular trash. Questions to ask your health care provider  How often should I be taking insulin?  How often should I check my blood glucose?  What amount of insulin should I be taking at each time?  What  are the side effects?  What should I do if my blood glucose is too high?  What should I do if my blood glucose is too low?  What should I do if I forget to take my insulin?  What number should I call if I have questions? Where to find more information  American Diabetes Association (ADA): www.diabetes.org  American Association of Diabetes Educators (AADE) Patient Resources: https://www.diabeteseducator.org Summary  A subcutaneous injection is a shot of medicine that is injected into the layer of fat and tissue between skin and muscle.  Before you give yourself an insulin injection, be sure to test your blood sugar level (blood glucose level) and write down that number.  The type of insulin that you  take may determine how many injections you give yourself and when you need to give the injections.  Check the expiration date and the type of insulin that you are using.  It is best to inject insulin into the same body area each time (for example, always in the abdomen), but you should use a different spot in that area for each injection. This information is not intended to replace advice given to you by your health care provider. Make sure you discuss any questions you have with your health care provider. Document Revised: 06/28/2017 Document Reviewed: 07/29/2015 Elsevier Patient Education  [place]. Hypoglycemia Hypoglycemia occurs when the level of sugar (glucose) in the blood is too low. Hypoglycemia can happen in people who do or do not have diabetes. It can develop quickly, and it can be a medical emergency. For most people with diabetes, a blood glucose level below 70 mg/dL (3.9 mmol/L) is considered hypoglycemia. Glucose is a type of sugar that provides the body's main source of energy. Certain hormones (insulin and glucagon) control the level of glucose in the blood. Insulin lowers blood glucose, and glucagon raises blood glucose. Hypoglycemia can result from having too  much insulin in the bloodstream, or from not eating enough food that contains glucose. You may also have reactive hypoglycemia, which happens within 4 hours after eating a meal. What are the causes? Hypoglycemia occurs most often in people who have diabetes and may be caused by:  Diabetes medicine.  Not eating enough, or not eating often enough.  Increased physical activity.  Drinking alcohol on an empty stomach. If you do not have diabetes, hypoglycemia may be caused by:  A tumor in the pancreas.  Not eating enough, or not eating for long periods at a time (fasting).  A severe infection or illness.  Certain medicines. What increases the risk? Hypoglycemia is more likely to develop in:  People who have diabetes and take medicines to lower blood glucose.  People who abuse alcohol.  People who have a severe illness. What are the signs or symptoms? Mild symptoms Mild hypoglycemia may not cause any symptoms. If you do have symptoms, they may include:  Hunger.  Anxiety.  Sweating and feeling clammy.  Dizziness or feeling light-headed.  Sleepiness.  Nausea.  Increased heart rate.  Headache.  Blurry vision.  Irritability.  Tingling or numbness around the mouth, lips, or tongue.  A change in coordination.  Restless sleep. Moderate symptoms Moderate hypoglycemia can cause:  Mental confusion and poor judgment.  Behavior changes.  Weakness.  Irregular heartbeat. Severe symptoms Severe hypoglycemia is a medical emergency. It can cause:  Fainting.  Seizures.  Loss of consciousness (coma).  Death. How is this diagnosed? Hypoglycemia is diagnosed with a blood test to measure your blood glucose level. This blood test is done while you are having symptoms. Your health care provider may also do a physical exam and review your medical history. How is this treated? This condition can often be treated by immediately eating or drinking something that contains  sugar, such as:  Fruit juice, 4-6 oz (120-150 mL).  Regular soda (not diet soda), 4-6 oz (120-150 mL).  Low-fat milk, 4 oz (120 mL).  Several pieces of hard candy.  Sugar or honey, 1 Tbsp (15 mL). Treating hypoglycemia if you have diabetes If you are alert and able to swallow safely, follow the 15:15 rule:  Take 15 grams of a rapid-acting carbohydrate. Talk with your health care provider about how much you  should take.  Rapid-acting options include: ? Glucose pills (take 15 grams). ? 6-8 pieces of hard candy. ? 4-6 oz (120-150 mL) of fruit juice. ? 4-6 oz (120-150 mL) of regular (not diet) soda. ? 1 Tbsp (15 mL) honey or sugar.  Check your blood glucose 15 minutes after you take the carbohydrate.  If the repeat blood glucose level is still at or below 70 mg/dL (3.9 mmol/L), take 15 grams of a carbohydrate again.  If your blood glucose level does not increase above 70 mg/dL (3.9 mmol/L) after 3 tries, seek emergency medical care.  After your blood glucose level returns to normal, eat a meal or a snack within 1 hour.  Treating severe hypoglycemia Severe hypoglycemia is when your blood glucose level is at or below 54 mg/dL (3 mmol/L). Severe hypoglycemia is a medical emergency. Get medical help right away. If you have severe hypoglycemia and you cannot eat or drink, you may need an injection of glucagon. A family member or close friend should learn how to check your blood glucose and how to give you a glucagon injection. Ask your health care provider if you need to have an emergency glucagon injection kit available. Severe hypoglycemia may need to be treated in a hospital. The treatment may include getting glucose through an IV. You may also need treatment for the cause of your hypoglycemia. Follow these instructions at home:  General instructions  Take over-the-counter and prescription medicines only as told by your health care provider.  Monitor your blood glucose as told by  your health care provider.  Limit alcohol intake to no more than 1 drink a day for nonpregnant women and 2 drinks a day for men. One drink equals 12 oz of beer (355 mL), 5 oz of wine (148 mL), or 1 oz of hard liquor (44 mL).  Keep all follow-up visits as told by your health care provider. This is important. If you have diabetes:  Always have a rapid-acting carbohydrate snack with you to treat low blood glucose.  Follow your diabetes management plan as directed. Make sure you: ? Know the symptoms of hypoglycemia. It is important to treat it right away to prevent it from becoming severe. ? Take your medicines as directed. ? Follow your exercise plan. ? Follow your meal plan. Eat on time, and do not skip meals. ? Check your blood glucose as often as directed. Always check before and after exercise. ? Follow your sick day plan whenever you cannot eat or drink normally. Make this plan in advance with your health care provider.  Share your diabetes management plan with people in your workplace, school, and household.  Check your urine for ketones when you are ill and as told by your health care provider.  Carry a medical alert card or wear medical alert jewelry. Contact a health care provider if:  You have problems keeping your blood glucose in your target range.  You have frequent episodes of hypoglycemia. Get help right away if:  You continue to have hypoglycemia symptoms after eating or drinking something containing glucose.  Your blood glucose is at or below 54 mg/dL (3 mmol/L).  You have a seizure.  You faint. These symptoms may represent a serious problem that is an emergency. Do not wait to see if the symptoms will go away. Get medical help right away. Call your local emergency services (911 in the [place] Summary  Hypoglycemia occurs when the level of sugar (glucose) in the blood is too low.  Hypoglycemia can happen in people who do or do not have diabetes. It can develop  quickly, and it can be a medical emergency.  Make sure you know the symptoms of hypoglycemia and how to treat it.  Always have a rapid-acting carbohydrate snack with you to treat low blood sugar. This information is not intended to replace advice given to you by your health care provider. Make sure you discuss any questions you have with your health care provider. Document Revised: 12/16/2017 Document Reviewed: 07/29/2015 Elsevier Patient Education  [place]. Hyperglycemia Hyperglycemia occurs when the level of sugar (glucose) in the blood is too high. Glucose is a type of sugar that provides the body's main source of energy. Certain hormones (insulin and glucagon) control the level of glucose in the blood. Insulin lowers blood glucose, and glucagon increases blood glucose. Hyperglycemia can result from having too little insulin in the bloodstream, or from the body not responding normally to insulin. Hyperglycemia occurs most often in people who have diabetes (diabetes mellitus), but it can happen in people who do not have diabetes. It can develop quickly, and it can be life-threatening if it causes you to become severely dehydrated (diabetic ketoacidosis or hyperglycemic hyperosmolar state). Severe hyperglycemia is a medical emergency. What are the causes? If you have diabetes, hyperglycemia may be caused by:  Diabetes medicine.  Medicines that increase blood glucose or affect your diabetes control.  Not eating enough, or not eating often enough.  Changes in physical activity level.  Being sick or having an infection. If you have prediabetes or undiagnosed diabetes:  Hyperglycemia may be caused by those conditions. If you do not have diabetes, hyperglycemia may be caused by:  Certain medicines, including steroid medicines, beta-blockers, epinephrine, and thiazide diuretics.  Stress.  Serious illness.  Surgery.  Diseases of the pancreas.  Infection. What increases the  risk? Hyperglycemia is more likely to develop in people who have risk factors for diabetes, such as:  Having a family member with diabetes.  Having a gene for type 1 diabetes that is passed from parent to child (inherited).  Living in an area with cold weather conditions.  Exposure to certain viruses.  Certain conditions in which the body's disease-fighting (immune) system attacks itself (autoimmune disorders).  Being overweight or obese.  Having an inactive (sedentary) lifestyle.  Having been diagnosed with insulin resistance.  Having a history of prediabetes, gestational diabetes, or polycystic ovarian syndrome (PCOS).  Being of American-Indian, African-American, Hispanic/Latino, or Asian/Pacific Islander descent. What are the signs or symptoms? Hyperglycemia may not cause any symptoms. If you do have symptoms, they may include early warning signs, such as:  Increased thirst.  Hunger.  Feeling very tired.  Needing to urinate more often than usual.  Blurry vision. Other symptoms may develop if hyperglycemia gets worse, such as:  Dry mouth.  Loss of appetite.  Fruity-smelling breath.  Weakness.  Unexpected or rapid weight gain or weight loss.  Tingling or numbness in the hands or feet.  Headache.  Skin that does not quickly return to normal after being lightly pinched and released (poor skin turgor).  Abdominal pain.  Cuts or bruises that are slow to heal. How is this diagnosed? Hyperglycemia is diagnosed with a blood test to measure your blood glucose level. This blood test is usually done while you are having symptoms. Your health care provider may also do a physical exam and review your medical history. You may have more tests to determine the cause of your  hyperglycemia, such as:  A fasting blood glucose (FBG) test. You will not be allowed to eat (you will fast) for at least 8 hours before a blood sample is taken.  An A1c (hemoglobin A1c) blood test.  This provides information about blood glucose control over the previous 2-3 months.  An oral glucose tolerance test (OGTT). This measures your blood glucose at two times: ? After fasting. This is your baseline blood glucose level. ? Two hours after drinking a beverage that contains glucose. How is this treated? Treatment depends on the cause of your hyperglycemia. Treatment may include:  Taking medicine to regulate your blood glucose levels. If you take insulin or other diabetes medicines, your medicine or dosage may be adjusted.  Lifestyle changes, such as exercising more, eating healthier foods, or losing weight.  Treating an illness or infection, if this caused your hyperglycemia.  Checking your blood glucose more often.  Stopping or reducing steroid medicines, if these caused your hyperglycemia. If your hyperglycemia becomes severe and it results in hyperglycemic hyperosmolar state, you must be hospitalized and given IV fluids. Follow these instructions at home:  General instructions  Take over-the-counter and prescription medicines only as told by your health care provider.  Do not use any products that contain nicotine or tobacco, such as cigarettes and e-cigarettes. If you need help quitting, ask your health care provider.  Limit alcohol intake to no more than 1 drink per day for nonpregnant women and 2 drinks per day for men. One drink equals 12 oz of beer, 5 oz of wine, or 1 oz of hard liquor.  Learn to manage stress. If you need help with this, ask your health care provider.  Keep all follow-up visits as told by your health care provider. This is important. Eating and drinking   Maintain a healthy weight.  Exercise regularly, as directed by your health care provider.  Stay hydrated, especially when you exercise, get sick, or spend time in hot temperatures.  Eat healthy foods, such as: ? Lean proteins. ? Complex carbohydrates. ? Fresh fruits and  vegetables. ? Low-fat dairy products. ? Healthy fats.  Drink enough fluid to keep your urine clear or pale yellow. If you have diabetes:  Make sure you know the symptoms of hyperglycemia.  Follow your diabetes management plan, as told by your health care provider. Make sure you: ? Take your insulin and medicines as directed. ? Follow your exercise plan. ? Follow your meal plan. Eat on time, and do not skip meals. ? Check your blood glucose as often as directed. Make sure to check your blood glucose before and after exercise. If you exercise longer or in a different way than usual, check your blood glucose more often. ? Follow your sick day plan whenever you cannot eat or drink normally. Make this plan in advance with your health care provider.  Share your diabetes management plan with people in your workplace, school, and household.  Check your urine for ketones when you are ill and as told by your health care provider.  Carry a medical alert card or wear medical alert jewelry. Contact a health care provider if:  Your blood glucose is at or above 240 mg/dL (13.3 mmol/L) for 2 days in a row.  You have problems keeping your blood glucose in your target range.  You have frequent episodes of hyperglycemia. Get help right away if:  You have difficulty breathing.  You have a change in how you think, feel, or act (mental  status).  You have nausea or vomiting that does not go away. These symptoms may represent a serious problem that is an emergency. Do not wait to see if the symptoms will go away. Get medical help right away. Call your local emergency services (911 in the [place] Do not drive yourself to the hospital. Summary  Hyperglycemia occurs when the level of sugar (glucose) in the blood is too high.  Hyperglycemia is diagnosed with a blood test to measure your blood glucose level. This blood test is usually done while you are having symptoms. Your health care provider may also  do a physical exam and review your medical history.  If you have diabetes, follow your diabetes management plan as told by your health care provider.  Contact your health care provider if you have problems keeping your blood glucose in your target range. This information is not intended to replace advice given to you by your health care provider. Make sure you discuss any questions you have with your health care provider. Document Revised: 03/12/2016 Document Reviewed: 03/12/2016 Elsevier Patient Education  2020 [place] [facility] WOUND CARE: - Change dressing twice daily - Supplies: sterile saline, kerlex, scissors, ABD pads, tape  1. Remove dressing and all packing carefully, moistening with sterile saline as needed to avoid packing/internal dressing sticking to the wound. 2.   Clean edges of skin around the wound with water/gauze, making sure there is no tape debris or leakage left on skin that could cause skin irritation or breakdown. 3.   Dampen and clean kerlex with sterile saline and pack wound from wound base to skin level, making sure to take note of any possible areas of wound tracking, tunneling and packing appropriately. Wound can be packed loosely. Trim kerlex to size if a whole kerlex is not required. 4.   Cover wound with a dry ABD pad and secure with tape.  5.   Write the date/time on the dry dressing/tape to better track when the last dressing change occurred. - apply any skin protectant/powder if recommended by clinician to protect skin/skin folds. - change dressing as needed if leakage occurs, wound gets contaminated, or patient requests to shower. - You may shower daily with wound open and following the shower the wound should be dried and a clean dressing placed.  - Medical grade tape as well as packing supplies can be found at [facility] on [address]. The remaining supplies can be found at your local drug store, [employer] etc.  Contact a health care  provider if:  You have more drainage, redness, swelling, or pain at your wound site.  You notice a bad smell coming from the wound site.  Your wound site feels warm to the touch.  Your wound becomes larger or deeper.  Your wound changes in size or depth. Get help right away if:  Your pain is not controlled with pain medicine.  The tissue inside your wound changes color from pink to white, yellow, or black.  You have a fever.  You have shaking chills.  You are having trouble packing your wound. Summary  Wound packing usually involves placing a moistened packing material into your wound and then covering it with an outer bandage (dressing).  Follow your health care provider's instructions on how often you need to change dressings and pack your wound. You will likely be asked to change dressings 1-2 times a day.  When packing your wound, it is important to use gloves and  a clean technique in order to avoid spreading germs into the wound.  Check your wound site every day for signs of infection.

## 2019-10-02 NOTE — Progress Notes (Addendum)
[facility]  Date of Admission:  09/27/2019      Total days of antibiotics 7  Day 2 ceftriaxone + flagyl           ASSESSMENT: Logan Weiss is a 49 y.o. male with polymicrobial necrotizing soft tissue infection of the right perineum and thigh. His wound looks much cleaner today with increased dressing change frequency. Would continue antibiotics during hospitalization and discontinue on discharge (which sounds to be Saturday). He can follow up with surgery team for ongoing wound care/monitoring.   Discussed importance of wound care as directed to keep the wound clean and encourage healing.  Blood sugar management as well.   PLAN: 1. Continue antibiotics for now. Please stop at discharge home - wound care and diabetic control will be the mainstay for his treatment going forward.  2. Follow up with Surgery Team    Principal Problem:   Necrotizing soft tissue infection Active Problems:   Abscess of multiple sites of perineum   Type 2 diabetes mellitus with hyperglycemia (HCC)   Normocytic anemia   . atorvastatin  20 mg Oral q1800  . Chlorhexidine Gluconate Cloth  6 each Topical Daily  . enoxaparin (LOVENOX) injection  40 mg Subcutaneous Q24H  . feeding supplement (PRO-STAT SUGAR FREE 64)  30 mL Oral BID  . insulin aspart  0-20 Units Subcutaneous TID WC  . insulin aspart  0-5 Units Subcutaneous QHS  . insulin aspart  12 Units Subcutaneous TID WC  . insulin glargine  30 Units Subcutaneous QHS  . metroNIDAZOLE  500 mg Oral Q8H  . multivitamin with minerals  1 tablet Oral Daily  . mupirocin ointment  1 application Nasal BID  . nutrition supplement (JUVEN)  1 packet Oral BID BM  . polyethylene glycol  17 g Oral Daily    SUBJECTIVE: Pain is a bit better today. Still very worried about going home with inadequate pain control.   POD#3 following  Second Debridement of R groin and perineum. Dressing change frequency ongoing to BID due to  slough in the wound bed (photo pictured below).    Review of Systems: Review of Systems  Constitutional: Negative for chills and fever.  Gastrointestinal: Negative for abdominal pain, diarrhea, nausea and vomiting.  Skin: Negative for itching and rash.    Allergies  Allergen Reactions  . Metformin And Related Diarrhea and Nausea And Vomiting  . Doxycycline Hives    OBJECTIVE: Vitals:   10/01/19 2035 10/01/19 2343 10/02/19 0454 10/02/19 0814  BP: 108/83 138/77 127/78 123/80  Pulse: 72 72 81 93  Resp: 19 15 17 18   Temp: 98.3 F (36.8 C) 98.5 F (36.9 C) 98.6 F (37 C) 98.5 F (36.9 C)  TempSrc: Oral Oral Oral Oral  SpO2: 97% 97% 98% 96%  Weight:   82.1 kg   Height:       Body mass index is 29.21 kg/m.   Physical Exam Constitutional:      Comments: Resting in bed. Smiling. Appears improved today.   HENT:     Mouth/Throat:     Mouth: Mucous membranes are moist.     Pharynx: Oropharynx is clear.  Eyes:     General: No scleral icterus. Cardiovascular:     Rate and Rhythm: Normal rate.  Pulmonary:     Effort: Pulmonary effort is normal.  Neurological:     Mental Status: He is oriented to person, place, and time.  CCS evaluation this AM - some slough but wound bed cleaner/dryer today than yesterday      Lab Results Lab Results  Component Value Date   WBC 12.4 (H) 10/01/2019   HGB 11.5 (L) 10/01/2019   HCT 34.8 (L) 10/01/2019   MCV 92.1 10/01/2019   PLT 258 10/01/2019    Lab Results  Component Value Date   CREATININE 0.78 09/30/2019   BUN <5 (L) 09/30/2019   NA 139 09/30/2019   K 3.6 09/30/2019   CL 99 09/30/2019   CO2 27 09/30/2019    Lab Results  Component Value Date   ALT 11 09/29/2019   AST 15 09/29/2019   ALKPHOS 75 09/29/2019   BILITOT 0.9 09/29/2019     Microbiology: Recent Results (from the past 240 hour(s))  Respiratory Panel by RT PCR (Flu A&B, Covid) - Nasopharyngeal Swab     Status: None   Collection Time: 09/27/19  6:29 AM    Specimen: Nasopharyngeal Swab  Result Value Ref Range Status   SARS Coronavirus 2 by RT PCR NEGATIVE NEGATIVE Final    Comment: (NOTE) SARS-CoV-2 target nucleic acids are NOT DETECTED. The SARS-CoV-2 RNA is generally detectable in upper respiratoy specimens during the acute phase of infection. The lowest concentration of SARS-CoV-2 viral copies this assay can detect is 131 copies/mL. A negative result does not preclude SARS-Cov-2 infection and should not be used as the sole basis for treatment or other patient management decisions. A negative result may occur with  improper specimen collection/handling, submission of specimen other than nasopharyngeal swab, presence of viral mutation(s) within the areas targeted by this assay, and inadequate number of viral copies (<131 copies/mL). A negative result must be combined with clinical observations, patient history, and epidemiological information. The expected result is Negative. Fact Sheet for Patients:  https://www.moore.com/ Fact Sheet for Healthcare Providers:  https://www.young.biz/ This test is not yet ap proved or cleared by the [place] FDA and  has been authorized for detection and/or diagnosis of SARS-CoV-2 by FDA under an Emergency Use Authorization (EUA). This EUA will remain  in effect (meaning this test can be used) for the duration of the COVID-19 declaration under Section 564(b)(1) of the Act, 21 U.S.C. section 360bbb-3(b)(1), unless the authorization is terminated or revoked sooner.    Influenza A by PCR NEGATIVE NEGATIVE Final   Influenza B by PCR NEGATIVE NEGATIVE Final    Comment: (NOTE) The Xpert Xpress SARS-CoV-2/FLU/RSV assay is intended as an aid in  the diagnosis of influenza from Nasopharyngeal swab specimens and  should not be used as a sole basis for treatment. Nasal washings and  aspirates are unacceptable for Xpert Xpress SARS-CoV-2/FLU/RSV  testing. Fact  Sheet for Patients: https://www.moore.com/ Fact Sheet for Healthcare Providers: https://www.young.biz/ This test is not yet approved or cleared by the [place] FDA and  has been authorized for detection and/or diagnosis of SARS-CoV-2 by  FDA under an Emergency Use Authorization (EUA). This EUA will remain  in effect (meaning this test can be used) for the duration of the  Covid-19 declaration under Section 564(b)(1) of the Act, 21  U.S.C. section 360bbb-3(b)(1), unless the authorization is  terminated or revoked. Performed at [facility], [address], [place], [place] [address]   Fungus Culture With Stain     Status: None (Preliminary result)   Collection Time: 09/27/19 11:00 AM   Specimen: Abscess  Result Value Ref Range Status   Fungus Stain Final report  Final    Comment: (  NOTE) Performed At: [facility] [place], [place] [id number] Rush Farmer MD AY:[id number]    Fungus (Mycology) Culture PENDING  Incomplete   Fungal Source ABSCESS  Final  Aerobic/Anaerobic Culture (surgical/deep wound)     Status: None   Collection Time: 09/27/19 11:00 AM   Specimen: Abscess  Result Value Ref Range Status   Specimen Description ABSCESS  Final   Special Requests NONE  Final   Gram Stain   Final    FEW WBC PRESENT,BOTH PMN AND MONONUCLEAR ABUNDANT GRAM POSITIVE COCCI IN PAIRS IN CLUSTERS MODERATE GRAM NEGATIVE RODS RARE GRAM POSITIVE RODS Performed at [facility], [place] [address] [place], [place] [address]    Culture   Final    MODERATE GROUP B STREP(S.AGALACTIAE)ISOLATED TESTING AGAINST S. AGALACTIAE NOT ROUTINELY PERFORMED DUE TO PREDICTABILITY OF AMP/PEN/VAN SUSCEPTIBILITY. MODERATE STREPTOCOCCUS CONSTELLATUS MODERATE PREVOTELLA BIVIA BETA LACTAMASE POSITIVE    Report Status 10/01/2019 FINAL  Final   Organism ID, Bacteria STREPTOCOCCUS CONSTELLATUS  Final      Susceptibility   Streptococcus  constellatus - MIC*    PENICILLIN 0.25 INTERMEDIATE Intermediate     CEFTRIAXONE 1 SENSITIVE Sensitive     ERYTHROMYCIN <=0.12 SENSITIVE Sensitive     LEVOFLOXACIN <=0.25 SENSITIVE Sensitive     VANCOMYCIN 0.5 SENSITIVE Sensitive     * MODERATE STREPTOCOCCUS CONSTELLATUS  Fungus Culture Result     Status: None   Collection Time: 09/27/19 11:00 AM  Result Value Ref Range Status   Result 1 Comment  Final    Comment: (NOTE) KOH/Calcofluor preparation:  no fungus observed. Performed At: [facility] [place], [place] [id number] Rush Farmer MD [id number]   Fungus Culture With Stain     Status: None (Preliminary result)   Collection Time: 09/27/19 11:37 AM   Specimen: Abscess; Body Fluid  Result Value Ref Range Status   Fungus Stain Final report  Final    Comment: (NOTE) Performed At: [facility] [place], [place] [id number] Rush Farmer MD GY:[id number]    Fungus (Mycology) Culture PENDING  Incomplete   Fungal Source EAR  Final  Aerobic/Anaerobic Culture (surgical/deep wound)     Status: None   Collection Time: 09/27/19 11:37 AM   Specimen: Abscess; Body Fluid  Result Value Ref Range Status   Specimen Description ABSCESS  Final   Special Requests NONE  Final   Gram Stain   Final    RARE WBC PRESENT, PREDOMINANTLY PMN FEW GRAM POSITIVE COCCI    Culture   Final    FEW STAPHYLOCOCCUS AUREUS NO ANAEROBES ISOLATED Performed at [facility], [address] [place], [place] [address]    Report Status 10/02/2019 FINAL  Final   Organism ID, Bacteria STAPHYLOCOCCUS AUREUS  Final      Susceptibility   Staphylococcus aureus - MIC*    CIPROFLOXACIN <=0.5 SENSITIVE Sensitive     ERYTHROMYCIN <=0.25 SENSITIVE Sensitive     GENTAMICIN <=0.5 SENSITIVE Sensitive     OXACILLIN 0.5 SENSITIVE Sensitive     TETRACYCLINE <=1 SENSITIVE Sensitive     VANCOMYCIN 1 SENSITIVE Sensitive     TRIMETH/SULFA <=10 SENSITIVE Sensitive      CLINDAMYCIN <=0.25 SENSITIVE Sensitive     RIFAMPIN <=0.5 SENSITIVE Sensitive     Inducible Clindamycin NEGATIVE Sensitive     * FEW STAPHYLOCOCCUS AUREUS  Fungus Culture Result     Status: None   Collection Time: 09/27/19 11:37 AM  Result Value Ref Range Status  Result 1 Comment  Final    Comment: (NOTE) KOH/Calcofluor preparation:  no fungus observed. Performed At: [facility] [address] [place], [place] [id number] Jolene Schimke MD SW:[id number]   Surgical pcr screen     Status: Abnormal   Collection Time: 09/29/19 11:00 AM   Specimen: Nasal Mucosa; Nasal Swab  Result Value Ref Range Status   MRSA, PCR NEGATIVE NEGATIVE Final   Staphylococcus aureus POSITIVE (A) NEGATIVE Final    Comment: (NOTE) The Xpert SA Assay (FDA approved for NASAL specimens in patients 62 years of age and older), is one component of a comprehensive surveillance program. It is not intended to diagnose infection nor to guide or monitor treatment. Performed at [facility], [address] [place], [place] [address]   Culture, blood (routine x 2)     Status: None (Preliminary result)   Collection Time: 09/30/19  9:18 AM   Specimen: BLOOD  Result Value Ref Range Status   Specimen Description BLOOD RIGHT ANTECUBITAL  Final   Special Requests   Final    BOTTLES DRAWN AEROBIC AND ANAEROBIC Blood Culture results may not be optimal due to an inadequate volume of blood received in culture bottles   Culture   Final    NO GROWTH 2 DAYS Performed at [facility], [address] [place], [place] [address]    Report Status PENDING  Incomplete  Culture, blood (routine x 2)     Status: None (Preliminary result)   Collection Time: 09/30/19  9:18 AM   Specimen: BLOOD RIGHT HAND  Result Value Ref Range Status   Specimen Description BLOOD RIGHT HAND  Final   Special Requests   Final    BOTTLES DRAWN AEROBIC AND ANAEROBIC Blood Culture adequate volume   Culture   Final    NO GROWTH 2  DAYS Performed at [facility], [address] [place], [place] [address]    Report Status PENDING  Incomplete    Rexene Alberts, MSN, NP-C [facility] Cell: [phone] Pager: [phone]  @TODAY @ 10:56 AM

## 2019-10-03 DIAGNOSIS — R739 Hyperglycemia, unspecified: Secondary | ICD-10-CM

## 2019-10-03 LAB — GLUCOSE, CAPILLARY
Glucose-Capillary: 189 mg/dL — ABNORMAL HIGH (ref 70–99)
Glucose-Capillary: 173 mg/dL — ABNORMAL HIGH (ref 70–99)
Glucose-Capillary: 223 mg/dL — ABNORMAL HIGH (ref 70–99)
Glucose-Capillary: 68 mg/dL — ABNORMAL LOW (ref 70–99)
Glucose-Capillary: 76 mg/dL (ref 70–99)

## 2019-10-03 NOTE — Progress Notes (Signed)
Pt's groin dressing change done by pt's wife with RN help and supervision without difficulty. Pt tolerated dressing change well.  Will continue to monitor.

## 2019-10-03 NOTE — Progress Notes (Signed)
   Subjective:   Feeling well today. Pain is well controlled. He and his wife would like information on how to administer insulin as they have never done this before.   Objective:  Vital signs in last 24 hours: Vitals:   10/02/19 1743 10/02/19 2111 10/02/19 2309 10/03/19 0610  BP:  115/82 128/77 124/86  Pulse:  80 88 94  Resp: 20 17 18 20   Temp: 99.3 F (37.4 C) 98.5 F (36.9 C) 98.4 F (36.9 C) 98.3 F (36.8 C)  TempSrc: Oral Oral Oral Oral  SpO2:  95% 95% 96%  Weight:    80.6 kg  Height:       Constitution: NAD, appears stated age Cardio: RRR, no m/r/g, no LE edema  Respiratory: CTA, no w/r/r Abdominal: NTTP, soft, non-distended, +BS Neuro: normal affect, a&ox3 Skin: c/d/i, dressings in place without strike through  Assessment/Plan:  Principal Problem:   Necrotizing soft tissue infection Active Problems:   Type 2 diabetes mellitus with hyperglycemia (HCC)   Abscess of multiple sites of perineum   Normocytic anemia  48yo M with PMH uncontrolled type II diabetes presenting with R perineal abscess and soft tissue infection with DKA.   Necrotizing soft tissue infection R. Perineal abscess S/p debridement and drainage x2. Culture + for pan-sensitive staph aureus, GBS, and strep constellatus. Completed 3 days of clindamycin 3/23. Vanc/merrem > unasyn on 3/24 > ctx/flagyl 3/25 - cont. Ceftriaxone/flagyl until discharge - bid dressing changes, will follow-up with Dr. 4/25.  - switch to po pain medications   Uncontrolled Type II DM - cont. novolog 12U tid, lantus 30 U qhs, and SSI resistant with qhs ssi. Levemir at discharge.  - consult diabetes coordinator for assistance in insulin education  VTE: lovenox IVF: none Diet: CM Code: full   Dispo: Anticipated discharge in approximately today or tomorrow.   Dwain Sarna A, DO 10/03/2019, 7:20 AM Pager: [phone]

## 2019-10-03 NOTE — Progress Notes (Signed)
Patient ID: Logan Weiss, male   DOB: 03-28-1971, 49 y.o.   MRN: [id number]    4 Days Post-Op  Subjective: Patient just had dressing changed.  Otherwise waiting to hopefully be discharged.  ROS: See above, otherwise other systems negative  Objective: Vital signs in last 24 hours: Temp:  [98.2 F (36.8 C)-100.1 F (37.8 C)] 98.2 F (36.8 C) (03/27 1025) Pulse Rate:  [80-110] 92 (03/27 1025) Resp:  [17-23] 23 (03/27 1025) BP: (111-128)/(74-86) 116/75 (03/27 1025) SpO2:  [95 %-96 %] 95 % (03/27 1025) Weight:  [80.6 kg] 80.6 kg (03/27 0610) Last BM Date: 10/02/19  Intake/Output from previous day: 03/26 0701 - 03/27 0700 In: 120 [P.O.:120] Out: 1050 [Urine:1050] Intake/Output this shift: No intake/output data recorded.  PE: Ext: right groin wound is clean.  Granulation tissue noted at base of superior aspect of wound.  No purulent drainage was noted  Lab Results:  Recent Labs    10/01/19 0607  WBC 12.4*  HGB 11.5*  HCT 34.8*  PLT 258   BMET No results for input(s): NA, K, CL, CO2, GLUCOSE, BUN, CREATININE, CALCIUM in the last 72 hours. PT/INR No results for input(s): LABPROT, INR in the last 72 hours. CMP     Component Value Date/Time   NA 139 09/30/2019 0331   K 3.6 09/30/2019 0331   CL 99 09/30/2019 0331   CO2 27 09/30/2019 0331   GLUCOSE 247 (H) 09/30/2019 0331   BUN <5 (L) 09/30/2019 0331   CREATININE 0.78 09/30/2019 0331   CREATININE 0.78 06/05/2014 1155   CALCIUM 8.5 (L) 09/30/2019 0331   PROT 5.9 (L) 09/29/2019 0829   ALBUMIN 2.1 (L) 09/29/2019 0829   AST 15 09/29/2019 0829   ALT 11 09/29/2019 0829   ALKPHOS 75 09/29/2019 0829   BILITOT 0.9 09/29/2019 0829   GFRNONAA >60 09/30/2019 0331   GFRNONAA >89 06/05/2014 1155   GFRAA >60 09/30/2019 0331   GFRAA >89 06/05/2014 1155   Lipase     Component Value Date/Time   LIPASE 39 04/13/2018 0831       Studies/Results: No results found.  Anti-infectives: Anti-infectives (From admission, onward)    Start     Dose/Rate Route Frequency Ordered Stop   10/01/19 1800  cefTRIAXone (ROCEPHIN) 2 g in sodium chloride 0.9 % 100 mL IVPB     2 g 200 mL/hr over 30 Minutes Intravenous Every 24 hours 10/01/19 1315 10/03/19 2359   10/01/19 1630  metroNIDAZOLE (FLAGYL) tablet 500 mg     500 mg Oral Every 8 hours 10/01/19 1609     09/30/19 1200  Ampicillin-Sulbactam (UNASYN) 3 g in sodium chloride 0.9 % 100 mL IVPB  Status:  Discontinued     3 g 200 mL/hr over 30 Minutes Intravenous Every 6 hours 09/30/19 1104 10/01/19 1315   09/27/19 2030  meropenem (MERREM) 1 g in sodium chloride 0.9 % 100 mL IVPB  Status:  Discontinued     1 g 200 mL/hr over 30 Minutes Intravenous Every 8 hours 09/27/19 1938 09/30/19 1104   09/27/19 2030  vancomycin (VANCOCIN) IVPB 1000 mg/200 mL premix  Status:  Discontinued     1,000 mg 200 mL/hr over 60 Minutes Intravenous Every 12 hours 09/27/19 1938 09/30/19 1104   09/27/19 2015  clindamycin (CLEOCIN) IVPB 600 mg  Status:  Discontinued     600 mg 100 mL/hr over 30 Minutes Intravenous Every 8 hours 09/27/19 1913 09/30/19 0741   09/27/19 0600  vancomycin (VANCOREADY) IVPB 1750 mg/350 mL  1,750 mg 175 mL/hr over 120 Minutes Intravenous  Once 09/27/19 0553 09/27/19 0823   09/27/19 0600  piperacillin-tazobactam (ZOSYN) IVPB 3.375 g     3.375 g 100 mL/hr over 30 Minutes Intravenous  Once 09/27/19 0553 09/27/19 0621   09/27/19 0600  clindamycin (CLEOCIN) IVPB 900 mg     900 mg 100 mL/hr over 30 Minutes Intravenous  Once 09/27/19 0558 09/27/19 4268       Assessment/Plan DM2 w/ DKA on presentation  Perineal and right groin necrotizing soft tissue infection -POD #6s/pirrigation and debridement of R perineum and thigh- Dr. Bobbye Morton - 3/21 - POD #4s/pdebridement of skin, soft tissue rightgroin and perineummeasuring22x8x4 cm- Dr. Donne Hazel - 3/23 -Cont abx per ID. Cx with stap aureus and GBS. May DC abx at discharge -Cont WD dressing changes.  Wife states she  has been taught how to do this at home. -surgically stable for DC home from our standpoint.  Follow up arranged.  FEN -HH VTE -SCDs, okay for chemical prophylaxis from a general surgery standpoint ID -Clinda3/21-3/23.Merrem/Vanc 3/21- 3/24. Unasyn 3/24. Ropcehin/Flagyl 3/25 >> Foley - None  Follow-Up - Dr. Donne Hazel      LOS: 6 days    Henreitta Cea , [facility] Surgery 10/03/2019, 11:46 AM Please see Amion for pager number during day hours 7:00am-4:30pm or 7:00am -11:30am on weekends

## 2019-10-03 NOTE — Progress Notes (Signed)
Attempted to help pt's wife practice using insulin pen with their practice kit, however, there was no pen with the kit.  Used practice insulin pen available on the floor.  Wife voiced understanding of pen utilization.  Patient and wife state they had not see a pen with their kit.  Pharmacy notified.  Awaiting a new kit for patient.  Pharmacy stated [employer] would have practice pens.  RN called and requested pen from [employer].  Awaiting practice pen from [employer].

## 2019-10-04 DIAGNOSIS — Z794 Long term (current) use of insulin: Secondary | ICD-10-CM

## 2019-10-04 DIAGNOSIS — E11628 Type 2 diabetes mellitus with other skin complications: Principal | ICD-10-CM

## 2019-10-04 DIAGNOSIS — N151 Renal and perinephric abscess: Secondary | ICD-10-CM

## 2019-10-04 DIAGNOSIS — B9689 Other specified bacterial agents as the cause of diseases classified elsewhere: Secondary | ICD-10-CM

## 2019-10-04 LAB — GLUCOSE, CAPILLARY
Glucose-Capillary: 207 mg/dL — ABNORMAL HIGH (ref 70–99)
Glucose-Capillary: 266 mg/dL — ABNORMAL HIGH (ref 70–99)
Glucose-Capillary: 221 mg/dL — ABNORMAL HIGH (ref 70–99)

## 2019-10-04 MED ORDER — INSULIN ASPART 100 UNIT/ML FLEXPEN: 12.0000 [IU] | PEN_INJECTOR | Freq: Three times a day (TID) | SUBCUTANEOUS | 0 refills | Status: DC

## 2019-10-04 MED ORDER — INSULIN GLARGINE 100 UNIT/ML SOLOSTAR PEN: 35.0000 [IU] | PEN_INJECTOR | Freq: Every day | SUBCUTANEOUS | 0 refills | Status: DC

## 2019-10-04 MED ORDER — INSULIN GLARGINE 100 UNIT/ML ~~LOC~~ SOLN
35.0000 [IU] | Freq: Every day | SUBCUTANEOUS | Status: DC
  Filled 2019-10-04: qty 0.35

## 2019-10-04 MED ORDER — OXYCODONE HCL 5 MG PO TABS: 5.0000 mg | ORAL_TABLET | ORAL | 0 refills | Status: DC | PRN

## 2019-10-04 MED ORDER — POLYETHYLENE GLYCOL 3350 17 G PO PACK: 17.0000 g | PACK | Freq: Every day | ORAL | 0 refills | Status: DC

## 2019-10-04 NOTE — Care Management (Addendum)
Notified by RN that patient did not receive shower stool and is now gone.  Keon with [employer] states they were unable to verify insurance coverage for shower stool because insurance portal "down". RN states patient is aware he can obtain elsewhere.

## 2019-10-04 NOTE — Progress Notes (Addendum)
Order received to discharge patient.  Telemetry monitor removed and CCMD notified.  PIV access x2 removed.  Discharge instructions (including insulin pen use), follow up, medications and instructions for their use discussed with patient.

## 2019-10-04 NOTE — Discharge Summary (Signed)
Name: Logan Weiss MRN: [id number] DOB: 08-Feb-1971 49 y.o. PCP: Lilian Coma., MD  Date of Admission: 09/27/2019  1:05 AM Date of Discharge: 10/04/19 Attending Physician: Lucious Groves, DO  Discharge Diagnosis: 1. Perineal soft tissue infection with abscess 2. Uncontrolled Type II Diabetes 3. DKA  Discharge Medications: Allergies as of 10/04/2019      Reactions   Metformin And Related Diarrhea, Nausea And Vomiting   Doxycycline Hives      Medication List    STOP taking these medications   glimepiride 4 MG tablet Commonly known as: AMARYL   pioglitazone 30 MG tablet Commonly known as: ACTOS   sulfamethoxazole-trimethoprim 800-160 MG tablet Commonly known as: BACTRIM DS     TAKE these medications   atorvastatin 20 MG tablet Commonly known as: LIPITOR Take 20 mg by mouth daily.   Cholecalciferol 125 MCG (5000 UT) capsule Take 1 tablet by mouth daily.   cyclobenzaprine 10 MG tablet Commonly known as: FLEXERIL Take 1 tablet (10 mg total) by mouth 2 (two) times daily as needed for muscle spasms.   GlucoCom Lancets Misc Check blood sugar TID & QHS   glucose blood test strip Commonly known as: Choice DM Fora G20 Test Strips Use as instructed   insulin aspart 100 UNIT/ML FlexPen Commonly known as: NOVOLOG Inject 12 Units into the skin 3 (three) times daily with meals.   insulin glargine 100 UNIT/ML Solostar Pen Commonly known as: LANTUS Inject 35 Units into the skin at bedtime.   oxyCODONE 5 MG immediate release tablet Commonly known as: Oxy IR/ROXICODONE Take 1 tablet (5 mg total) by mouth every 4 (four) hours as needed for up to 7 days for moderate pain or severe pain (5mg  for moderate pain, 10mg  for severe pain).   pantoprazole 20 MG tablet Commonly known as: PROTONIX Take 1 tablet (20 mg total) by mouth daily.   polyethylene glycol 17 g packet Commonly known as: MIRALAX / GLYCOLAX Take 17 g by mouth daily. While taking opioid pain  medication. Start taking on: October 05, 2019   vitamin C 100 MG tablet Take 1 tablet by mouth daily.   vitamin E 200 UNIT capsule Take 200 Units by mouth daily.            Durable Medical Equipment  (From admission, onward)         Start     Ordered   10/02/19 0943  For home use only DME Tub bench  Once     10/02/19 6195          Disposition and follow-up:   Mr.[name] was discharged from [facility] in Stable condition.  At the hospital follow up visit please address:  1. Perineal soft tissue infection with abscess: Underwent debridement & drainage. Treated with clinda/vanc/merrem and narrowed to ceftriaxone/flagyl for a total of 7 days antibiotics. BID wet to dry dressing changes per surgery. Will follow-up with Dr. Donne Hazel. He was discharged with 7 days of medication for pain and to assist with dressing changes 2. Uncontrolled Type II Diabetes: A1c 13.3. Discharged on Lantus 35U qhs and TID novolog 12U wc with close follow-up with PCP. His prior oral diabetes medications were stopped.   2.  Labs / imaging needed at time of follow-up: CBC, glucose  3.  Pending labs/ test needing follow-up: none  Follow-up Appointments: Follow-up Information    Rolm Bookbinder, MD. Go on 10/19/2019.   Specialty: General Surgery Why: 4/12 at 4:40.  You  will need to arrive 30 min early for paperwork. Please bring a copy of your photo ID and insurance card.  Contact information: [address] [place] [address] [phone]           Hospital Course by problem list: 1. Perineal soft tissue infection with abscess 2. Uncontrolled Type II Diabetes 3. DKA  [name] is a 49yo male with PMH of uncontrolled type II diabetes who presented with concern for abscess and soft tissue infection of the right perineum, buttocks, and thigh. He was started on broad spectrum antibiotics, including vancomycin, clindamycin, and zosyn and taken to the OR  for debridement and drainage.  He also was found to be in mild DKA. This was treated with insulin gtt and fluid resuscitation with resolution of his DKA later the day of admission.  He completed three days of clindamycin but continued to have fever, tachycardia and increasing white count. He was taken back to the OR with more extensive debridement with improvement in symptoms of sepsis. Wound cultures grew pan sensitive staph aureus, prevotella bivia, streptococcus constellatus, and group b strep. Infectious Disease was consulted for antibiotic recommendations, and he was narrowed to ceftriaxone and flagyl and treated with 7 days of antibiotics total. He and his wife were educated in BID dressing changes. He will follow-up with his PCP and surgery to assess wound healing.   Type II Diabetes For his diabetes he was treated with sliding scale insulin resistant, lantus, and tid with meal insulin. Insulin requirements decreased with resolution of his infection. He and his wife were educated in administration of insulin. He was discharged on 35U lantus and 12U tid with meals and will follow-up closely with his PCP to determine insulin needs and possibility of transition back to po diabetes medications.   Discharge Vitals:   BP 118/74 (BP Location: Left Arm)   Pulse 93   Temp 98.4 F (36.9 C) (Oral)   Resp 19   Ht 5\' 6"  (1.676 m)   Wt 81.3 kg   SpO2 96%   BMI 28.93 kg/m   Pertinent Labs, Studies, and Procedures:    Abscess Cultures 3/21 MODERATE GROUP B STREP(S.AGALACTIAE)ISOLATED  TESTING AGAINST S. AGALACTIAE NOT ROUTINELY PERFORMED DUE TO PREDICTABILITY OF AMP/PEN/VAN SUSCEPTIBILITY.  MODERATE STREPTOCOCCUS CONSTELLATUS  MODERATE PREVOTELLA BIVIA  BETA LACTAMASE POSITIVE STAPH AUREUS  CT Pelvis IMPRESSION: 1. Fluid and air is seen in the right medial thigh extending into the right mons pubis without definite involvement of the spermatic cord. The fluid and air extends inferiorly and  posteriorly into the right buttocks, abutting the intergluteal cleft. There is significant surrounding fat stranding and overlying skin thickening. The area involved is quite large extending from the anteromedial right thigh into the posterior right buttocks. No definite involvement of the scrotum. The fluid and air is not particularly well-defined. The findings are consistent with an infectious process. There is not 1 discrete abscess. The findings are otherwise nonspecific. Necrotizing fasciitis cannot be excluded on CT imaging. Recommend clinical correlation. 2. No other abnormalities.  Discharge Instructions: Discharge Instructions    Diet - low sodium heart healthy   Complete by: As directed    Discharge instructions   Complete by: As directed    You were hospitalized for perineal abscess and soft tissue infection. Thank you for allowing 4/21 to be part of your care.   Please follow-up with your primary care physician later this week.  I will have our Diabetes Coordinator at the  [facility], Lupita Leash, call you for a follow-up appointment to assist with diabetes and insulin educations. The clinic number is [phone].   Please note these changes made to your medications:   Insulin aspart (NOVOLOG) 100 unit/ml flexpan - Inject 12Units into the skin three times a day directly before meals.  Please check your glucose prior to taking insulin. Check your glucose one 30 minutes - 1 hour after your meals as well - this will help you discuss with your doctor or the diabetes coordinator how to adjust your insulin going forward.   Please take insulin glargine (LANTUS) 100 unit/ml flexpen - Inject 35Units into the skin at bedtime Check your insulin prior to bed as well.   *If you find that your morning insulin is low (prior to breakfast), this means your lantus needs to be decreased. You can start by decreasing to 32Units at bedtime.   Please take oxycodone 5 mg tablet -  take 1 tablet every 6 hours as needed for pain at your surgery site. Take 10mg  with dressing changes if needed. Do not drive or operate heavy machinery with this medication as it will make you drowsy.   Take Miralax 17 g - take 17g by mouth daily while using opioid pain medications as these will cause constipation.  Please STOP taking:  Glimepiride, pioglitazone, and sulfamethoxazole-trimethoprim.   Increase activity slowly   Complete by: As directed       Signed: , DO 10/04/2019, 12:23 PM   Pager: 10/06/2019

## 2019-10-04 NOTE — TOC Transition Note (Signed)
Transition of Care [facility]) - CM/SW Discharge Note   Patient Details  Name: Logan Weiss MRN: [id number] Date of Birth: [birth year]  Transition of Care [facility]) CM/SW Contact:  Deveron Furlong, RN 10/04/2019, 12:27 PM   Clinical Narrative:    Patient to d/c home.  Requests DME shower stool for small shower instead of 3n1.  Shower stool ordered from [employer] for delivery to room prior to d/c.     Final next level of care: Home/Self Care      Discharge Plan and Services                DME Arranged: Shower stool DME Agency: [employer] Date DME Agency Contacted: 10/04/19 Time DME Agency Contacted: 1227 Representative spoke with at DME Agency: Ledell Noss

## 2019-10-04 NOTE — Progress Notes (Signed)
   Subjective:   Having some pain. Denies nausea. Is a bit nervous about going home and making sure his insulin is well controlled and having dressing changes at home. Discussed home regimen and having close follow-up with his PCP and possibly the diabetes educator in [facility]MT[facility] clinic.   Objective:  Vital signs in last 24 hours: Vitals:   10/03/19 1609 10/03/19 2036 10/04/19 0042 10/04/19 0413  BP: 106/75 103/69 104/84 110/77  Pulse: 93 87 87 86  Resp: 18 19 14 15   Temp: 98.9 F (37.2 C) 98.6 F (37 C) 98.1 F (36.7 C) 97.9 F (36.6 C)  TempSrc: Oral Oral Oral Oral  SpO2: 96% 96% 93% 94%  Weight:    81.3 kg  Height:       Constitution: NAD, appears stated age Cardio: RRR, no m/r/g, no LE edema  Abdominal: NTTP, soft, non-distended Neuro: normal affect, a&ox3 Skin: right groin d&d site without purulent drainage, dressings in place, no surrounding erythema    Assessment/Plan:  Principal Problem:   Necrotizing soft tissue infection Active Problems:   Type 2 diabetes mellitus with hyperglycemia (HCC)   Abscess of multiple sites of perineum   Normocytic anemia   Hyperglycemia  49yo male with PMH uncontrolled type II diabetes presenting with R perineal abscess and soft tissue infection with DKA.   Necrotizing soft tissue infection R. Perineal abscess S/p debridement and drainage x2. Completed 3 days clinda, vanc/merrem > unasyn on 3/24 > ctx flagyl started 3/25 - cont. Ctx/flagyl until discharge - bid dressing changes, f/u with Dr. 4/25 after discharge - cont oxycodone prn   Uncontrolled Type II Diabetes Will transition to novolog 12 U tid and lantus 35U at discharge  VTE: lovenox  IVF: none Diet: CM Code: full  Dispo: Anticipated discharge today.   Dwain Sarna, DO 10/04/2019, 6:33 AM Pager: [phone]

## 2019-10-04 NOTE — Progress Notes (Signed)
Pt and wife are requesting diabetic coordinator meet with them for more in-depth explanation and education regarding insulin use at home before pt is discharged.

## 2019-10-05 ENCOUNTER — Telehealth: Payer: Self-pay | Admitting: Dietician

## 2019-10-05 ENCOUNTER — Other Ambulatory Visit: Payer: Self-pay

## 2019-10-05 LAB — CULTURE, BLOOD (ROUTINE X 2)
Culture: NO GROWTH
Culture: NO GROWTH
Special Requests: ADEQUATE

## 2019-10-06 NOTE — Telephone Encounter (Signed)
Phone conversation with Dayton Martes and Misty Stanley (his wife). Answered their questions about how to use insulin pen, what his schedule should be like, targets for blood sugars and meal planning. He just restarted his insulin yesterday. We decided to talk again Thursday after he collects more data to see if his doses need adjustment. He agreed to call his primary care doctor to schedule an appointment.  Blood sugars: 180 last night,  246 this am  Schedule: Check blood sugar before breakfast Take insulin 15 minutes before eating Eat  Check blood sugar 90-120 minutes after  Check blood sugar before lunch Take insulin 15 minutes before eating Eat Check blood sugar 90-120 minutes after   Check blood sugar before dinner Take insulin 15 minutes before eating Eat Check blood sugar 90-120 minutes after  Meal planning questions- carb consistent recommended ~55-65 grams per meal, 15-20 grams for snacks- gave ideas for what he could have and encouraged more for some meals.   Follow up Thursday. Will mail schedule and logbook Norm Parcel, RD 10/06/2019 9:31 AM.

## 2019-10-08 ENCOUNTER — Ambulatory Visit: Payer: 59 | Admitting: Dietician

## 2019-10-08 ENCOUNTER — Telehealth: Payer: Self-pay | Admitting: Dietician

## 2019-10-08 MED ORDER — CEFDINIR 300 MG PO CAPS: 600.0000 mg | ORAL_CAPSULE | Freq: Every day | ORAL | 0 refills | Status: DC

## 2019-10-08 NOTE — Telephone Encounter (Signed)
Call to follow up on blood sugars and new toinsulin per Dr. Cleaster Corin. Patient and wife report his "Sugar is still high" Today 233, 211 fasting 3/31 12 noon- 113 3/30 190 fasting  They are encouraged to write numbers down. They state he Has not missed any doses. Other questions were answered.  His wife asks if she should be concerned about him running a low grade fever today, 100.2 since noon today and he is not feeing well.  They do not know who to call and do not have a follow up scheduled. I told then I would check with our doctors and call them back by the end of today.

## 2019-10-08 NOTE — Telephone Encounter (Signed)
Spoke with patient, sugars are somewhat variable but much improved compared with prehospitalization (now 110s-260s), discussed that he will continue to monitor.  He has had a low grade temperature he puts at 99 range, continued pain and drainage- clear, not necessarily worsened they have been continuing wound care as directed they have not scheduled follow up with PCP at [facility] yet.  Discussed that I want him to call today for follow up appointment with PCP.  I discussed worsened fevers or pain he will need evaluation and that may need to be at ED or urgent care.   He also informed me at the end of the conversation the surgeons office was calling his wife, I told him it would be fine to take that call.

## 2019-10-08 NOTE — Telephone Encounter (Signed)
Logan Weiss came to me worried about this patient's fever. I was not aware at the time that Dr. Mikey Bussing had spoken with him. Dr. Percell Belt with the surgery team advised similar monitoring. I decided while talking with him to prescribe an oral antibiotic for wound infection. He was on ceftriaxone and flagyl until discharge. I prescribed a 5 day course of cefdinir. Gave him the on call phone numbers for the holiday weekend.

## 2019-10-10 ENCOUNTER — Emergency Department (HOSPITAL_COMMUNITY): Payer: 59

## 2019-10-10 ENCOUNTER — Telehealth: Payer: Self-pay | Admitting: Internal Medicine

## 2019-10-10 ENCOUNTER — Encounter (HOSPITAL_COMMUNITY): Payer: Self-pay

## 2019-10-10 ENCOUNTER — Inpatient Hospital Stay (HOSPITAL_COMMUNITY)
Admission: EM | Admit: 2019-10-10 | Discharge: 2019-10-14 | DRG: 300 | Disposition: A | Discharge status: Home / Self Care | Payer: 59 | Attending: Internal Medicine | Admitting: Internal Medicine

## 2019-10-10 DIAGNOSIS — F17211 Nicotine dependence, cigarettes, in remission: Secondary | ICD-10-CM

## 2019-10-10 DIAGNOSIS — D649 Anemia, unspecified: Secondary | ICD-10-CM | POA: Diagnosis not present

## 2019-10-10 DIAGNOSIS — M7989 Other specified soft tissue disorders: Secondary | ICD-10-CM | POA: Diagnosis present

## 2019-10-10 DIAGNOSIS — E1165 Type 2 diabetes mellitus with hyperglycemia: Secondary | ICD-10-CM | POA: Diagnosis present

## 2019-10-10 DIAGNOSIS — B9689 Other specified bacterial agents as the cause of diseases classified elsewhere: Secondary | ICD-10-CM | POA: Diagnosis not present

## 2019-10-10 DIAGNOSIS — Z881 Allergy status to other antibiotic agents status: Secondary | ICD-10-CM | POA: Diagnosis not present

## 2019-10-10 DIAGNOSIS — L02215 Cutaneous abscess of perineum: Secondary | ICD-10-CM | POA: Diagnosis present

## 2019-10-10 DIAGNOSIS — Z888 Allergy status to other drugs, medicaments and biological substances status: Secondary | ICD-10-CM

## 2019-10-10 DIAGNOSIS — L03818 Cellulitis of other sites: Secondary | ICD-10-CM

## 2019-10-10 DIAGNOSIS — Z833 Family history of diabetes mellitus: Secondary | ICD-10-CM

## 2019-10-10 DIAGNOSIS — E872 Acidosis: Secondary | ICD-10-CM | POA: Diagnosis present

## 2019-10-10 DIAGNOSIS — A419 Sepsis, unspecified organism: Secondary | ICD-10-CM | POA: Diagnosis present

## 2019-10-10 DIAGNOSIS — Y828 Other medical devices associated with adverse incidents: Secondary | ICD-10-CM | POA: Diagnosis present

## 2019-10-10 DIAGNOSIS — I96 Gangrene, not elsewhere classified: Secondary | ICD-10-CM | POA: Diagnosis not present

## 2019-10-10 DIAGNOSIS — R509 Fever, unspecified: Secondary | ICD-10-CM | POA: Diagnosis not present

## 2019-10-10 DIAGNOSIS — Z79899 Other long term (current) drug therapy: Secondary | ICD-10-CM | POA: Diagnosis not present

## 2019-10-10 DIAGNOSIS — I808 Phlebitis and thrombophlebitis of other sites: Secondary | ICD-10-CM | POA: Diagnosis not present

## 2019-10-10 DIAGNOSIS — Z794 Long term (current) use of insulin: Secondary | ICD-10-CM

## 2019-10-10 DIAGNOSIS — E118 Type 2 diabetes mellitus with unspecified complications: Secondary | ICD-10-CM | POA: Diagnosis not present

## 2019-10-10 DIAGNOSIS — Z8614 Personal history of Methicillin resistant Staphylococcus aureus infection: Secondary | ICD-10-CM

## 2019-10-10 DIAGNOSIS — Z87891 Personal history of nicotine dependence: Secondary | ICD-10-CM | POA: Diagnosis not present

## 2019-10-10 DIAGNOSIS — Z20822 Contact with and (suspected) exposure to covid-19: Secondary | ICD-10-CM | POA: Diagnosis present

## 2019-10-10 DIAGNOSIS — B9561 Methicillin susceptible Staphylococcus aureus infection as the cause of diseases classified elsewhere: Secondary | ICD-10-CM | POA: Diagnosis not present

## 2019-10-10 DIAGNOSIS — R748 Abnormal levels of other serum enzymes: Secondary | ICD-10-CM

## 2019-10-10 DIAGNOSIS — T8172XA Complication of vein following a procedure, not elsewhere classified, initial encounter: Secondary | ICD-10-CM | POA: Diagnosis present

## 2019-10-10 DIAGNOSIS — B954 Other streptococcus as the cause of diseases classified elsewhere: Secondary | ICD-10-CM | POA: Diagnosis not present

## 2019-10-10 DIAGNOSIS — R7401 Elevation of levels of liver transaminase levels: Secondary | ICD-10-CM | POA: Diagnosis not present

## 2019-10-10 DIAGNOSIS — E119 Type 2 diabetes mellitus without complications: Secondary | ICD-10-CM | POA: Diagnosis not present

## 2019-10-10 DIAGNOSIS — E11628 Type 2 diabetes mellitus with other skin complications: Secondary | ICD-10-CM | POA: Diagnosis not present

## 2019-10-10 DIAGNOSIS — L089 Local infection of the skin and subcutaneous tissue, unspecified: Secondary | ICD-10-CM | POA: Diagnosis not present

## 2019-10-10 DIAGNOSIS — R7989 Other specified abnormal findings of blood chemistry: Secondary | ICD-10-CM | POA: Diagnosis not present

## 2019-10-10 DIAGNOSIS — B951 Streptococcus, group B, as the cause of diseases classified elsewhere: Secondary | ICD-10-CM | POA: Diagnosis not present

## 2019-10-10 DIAGNOSIS — E1152 Type 2 diabetes mellitus with diabetic peripheral angiopathy with gangrene: Secondary | ICD-10-CM | POA: Diagnosis not present

## 2019-10-10 LAB — CBC WITH DIFFERENTIAL/PLATELET
Abs Immature Granulocytes: 0.05 10*3/uL (ref 0.00–0.07)
Basophils Absolute: 0 10*3/uL (ref 0.0–0.1)
Basophils Relative: 0 %
Eosinophils Absolute: 0 10*3/uL (ref 0.0–0.5)
Eosinophils Relative: 0 %
HCT: 43.4 % (ref 39.0–52.0)
Hemoglobin: 13.9 g/dL (ref 13.0–17.0)
Immature Granulocytes: 1 %
Lymphocytes Relative: 16 %
Lymphs Abs: 1.1 10*3/uL (ref 0.7–4.0)
MCH: 29.8 pg (ref 26.0–34.0)
MCHC: 32 g/dL (ref 30.0–36.0)
MCV: 93.1 fL (ref 80.0–100.0)
Monocytes Absolute: 0.2 10*3/uL (ref 0.1–1.0)
Monocytes Relative: 3 %
Neutro Abs: 5.2 10*3/uL (ref 1.7–7.7)
Neutrophils Relative %: 80 %
Platelets: 328 10*3/uL (ref 150–400)
RBC: 4.66 MIL/uL (ref 4.22–5.81)
RDW: 13.1 % (ref 11.5–15.5)
WBC: 6.6 10*3/uL (ref 4.0–10.5)
nRBC: 0 % (ref 0.0–0.2)

## 2019-10-10 LAB — COMPREHENSIVE METABOLIC PANEL
ALT: 68 U/L — ABNORMAL HIGH (ref 0–44)
AST: 53 U/L — ABNORMAL HIGH (ref 15–41)
Albumin: 2.8 g/dL — ABNORMAL LOW (ref 3.5–5.0)
Alkaline Phosphatase: 128 U/L — ABNORMAL HIGH (ref 38–126)
Anion gap: 12 (ref 5–15)
BUN: 7 mg/dL (ref 6–20)
CO2: 25 mmol/L (ref 22–32)
Calcium: 9 mg/dL (ref 8.9–10.3)
Chloride: 99 mmol/L (ref 98–111)
Creatinine, Ser: 0.97 mg/dL (ref 0.61–1.24)
GFR calc Af Amer: >60 mL/min (ref >60)
GFR calc non Af Amer: >60 mL/min (ref >60)
Glucose, Bld: 173 mg/dL — ABNORMAL HIGH (ref 70–99)
Potassium: 4.2 mmol/L (ref 3.5–5.1)
Sodium: 136 mmol/L (ref 135–145)
Total Bilirubin: 0.3 mg/dL (ref 0.3–1.2)
Total Protein: 7.5 g/dL (ref 6.5–8.1)

## 2019-10-10 LAB — URINALYSIS, ROUTINE W REFLEX MICROSCOPIC
Bilirubin Urine: NEGATIVE
Glucose, UA: NEGATIVE mg/dL
Hgb urine dipstick: NEGATIVE
Ketones, ur: NEGATIVE mg/dL
Leukocytes,Ua: NEGATIVE
Nitrite: NEGATIVE
Protein, ur: NEGATIVE mg/dL
Specific Gravity, Urine: >1.046 — ABNORMAL HIGH (ref 1.005–1.030)
pH: 6 (ref 5.0–8.0)

## 2019-10-10 LAB — LACTIC ACID, PLASMA: Lactic Acid, Venous: 2.1 mmol/L (ref 0.5–1.9)

## 2019-10-10 LAB — PROTIME-INR
INR: 1.1 (ref 0.8–1.2)
Prothrombin Time: 14.3 seconds (ref 11.4–15.2)

## 2019-10-10 LAB — POC SARS CORONAVIRUS 2 AG -  ED: SARS Coronavirus 2 Ag: NEGATIVE

## 2019-10-10 LAB — APTT: aPTT: 30 seconds (ref 24–36)

## 2019-10-10 MED ORDER — METRONIDAZOLE IN NACL 5-0.79 MG/ML-% IV SOLN
500.0000 mg | Freq: Once | INTRAVENOUS | Status: AC
  Administered 2019-10-11: 500 mg via INTRAVENOUS
  Filled 2019-10-10: qty 100

## 2019-10-10 MED ORDER — SODIUM CHLORIDE 0.9 % IV SOLN
2.0000 g | Freq: Three times a day (TID) | INTRAVENOUS | Status: DC
  Administered 2019-10-11 – 2019-10-13 (×7): 2 g via INTRAVENOUS
  Filled 2019-10-10 (×9): qty 2

## 2019-10-10 MED ORDER — INSULIN GLARGINE 100 UNIT/ML ~~LOC~~ SOLN
35.0000 [IU] | Freq: Every day | SUBCUTANEOUS | Status: DC
  Administered 2019-10-11 – 2019-10-13 (×4): 35 [IU] via SUBCUTANEOUS
  Filled 2019-10-10 (×5): qty 0.35

## 2019-10-10 MED ORDER — SODIUM CHLORIDE 0.9 % IV SOLN
2.0000 g | Freq: Once | INTRAVENOUS | Status: AC
  Administered 2019-10-10: 2 g via INTRAVENOUS
  Filled 2019-10-10: qty 2

## 2019-10-10 MED ORDER — VANCOMYCIN HCL 1500 MG/300ML IV SOLN
1500.0000 mg | Freq: Once | INTRAVENOUS | Status: AC
  Administered 2019-10-11: 1500 mg via INTRAVENOUS
  Filled 2019-10-10: qty 300

## 2019-10-10 MED ORDER — ATORVASTATIN CALCIUM 10 MG PO TABS
20.0000 mg | ORAL_TABLET | Freq: Every day | ORAL | Status: DC
  Administered 2019-10-11 – 2019-10-14 (×4): 20 mg via ORAL
  Filled 2019-10-10 (×4): qty 2

## 2019-10-10 MED ORDER — OXYCODONE HCL 5 MG PO TABS
5.0000 mg | ORAL_TABLET | ORAL | Status: DC | PRN
  Administered 2019-10-11: 5 mg via ORAL
  Filled 2019-10-10 (×2): qty 1

## 2019-10-10 MED ORDER — LACTATED RINGERS IV BOLUS (SEPSIS)
1000.0000 mL | Freq: Once | INTRAVENOUS | Status: AC
  Administered 2019-10-10: 1000 mL via INTRAVENOUS

## 2019-10-10 MED ORDER — VANCOMYCIN HCL IN DEXTROSE 1-5 GM/200ML-% IV SOLN: 1000.0000 mg | Freq: Once | INTRAVENOUS | Status: DC

## 2019-10-10 MED ORDER — VANCOMYCIN HCL IN DEXTROSE 1-5 GM/200ML-% IV SOLN
1000.0000 mg | Freq: Two times a day (BID) | INTRAVENOUS | Status: DC
  Administered 2019-10-11 – 2019-10-13 (×5): 1000 mg via INTRAVENOUS
  Filled 2019-10-10 (×5): qty 200

## 2019-10-10 MED ORDER — INSULIN ASPART 100 UNIT/ML ~~LOC~~ SOLN
12.0000 [IU] | Freq: Three times a day (TID) | SUBCUTANEOUS | Status: DC
  Administered 2019-10-11 – 2019-10-14 (×10): 12 [IU] via SUBCUTANEOUS

## 2019-10-10 MED ORDER — LACTATED RINGERS IV BOLUS (SEPSIS)
500.0000 mL | Freq: Once | INTRAVENOUS | Status: AC
  Administered 2019-10-10: 500 mL via INTRAVENOUS

## 2019-10-10 MED ORDER — SODIUM CHLORIDE 0.9% FLUSH
3.0000 mL | Freq: Once | INTRAVENOUS | Status: AC
  Administered 2019-10-10: 3 mL via INTRAVENOUS

## 2019-10-10 MED ORDER — ENOXAPARIN SODIUM 40 MG/0.4ML ~~LOC~~ SOLN
40.0000 mg | SUBCUTANEOUS | Status: DC
  Administered 2019-10-11 – 2019-10-14 (×4): 40 mg via SUBCUTANEOUS
  Filled 2019-10-10 (×4): qty 0.4

## 2019-10-10 MED ORDER — ACETAMINOPHEN 325 MG PO TABS
650.0000 mg | ORAL_TABLET | Freq: Once | ORAL | Status: AC
  Administered 2019-10-10: 650 mg via ORAL
  Filled 2019-10-10: qty 2

## 2019-10-10 MED ORDER — IOHEXOL 300 MG/ML  SOLN
100.0000 mL | Freq: Once | INTRAMUSCULAR | Status: AC | PRN
  Administered 2019-10-10: 100 mL via INTRAVENOUS

## 2019-10-10 NOTE — ED Notes (Signed)
Date and time results received: 10/10/19 0808    Test: lactic  Critical Value: 2.1  Name of Provider Notified: Madilyn Hook MD  Orders Received? Or Actions Taken?: awaiting new orders

## 2019-10-10 NOTE — ED Notes (Signed)
Pt unable to provide urine sample at this time 

## 2019-10-10 NOTE — ED Notes (Signed)
Informed by Isaias Cowman, RN from IV team. Unable to find vein with ultrasound for placement. Suggest IV consult once pt is upstairs. Will administer abx once bolus is done.

## 2019-10-10 NOTE — ED Notes (Signed)
IV team at bedside. Will start abx once second IV initiated. Pt resting in bed comfortably, wife at bedside.

## 2019-10-10 NOTE — Telephone Encounter (Signed)
   Reason for call:   I received a call from Mr. DEMONTRAY FRANTA' wife at 3:20 PM indicating Mr. Huesman is still febrile.   Pertinent Data:   49 year old male with history of  uncontrolled type II DM, perennial abscess, with recent hospitalization for debridement and drainage and antibiotic therapy.  His wife is concerned that despite being on p.o. antibiotic at home, his husband it is still febrile.  Per chart review, Dr. Ellin Mayhew recommended being seen in emergency department, when he was paged last night for similar concern by patient's wife.  Ms. Lepak mentions that she has not felt him to emergency department because she is afraid she has to leave him in ED and not sure how long does it take for patient to be seen.   Assessment / Plan / Recommendations:   Patient be history of perineal abscess, reports fever of 102 in past few days despite being on cefdinir.  Also reports that the abscess looks more "bloody" and is draining more.  He also has uncontrolled type 2 diabetes.  I advised ED visit for evaluating patient.  He may need IV antibiotic and I&D for more penetration. I explained this to patient's wife. She will call emergency department to ask about policy for having family members with patient in the emergency department.  I encouraged her to    as always, pt is advised that if symptoms worsen or new symptoms arise, they should go to an urgent care facility or to to ER for further evaluation.    Chevis Pretty, MD   10/10/2019, 3:33 PM

## 2019-10-10 NOTE — ED Provider Notes (Signed)
[name] EMERGENCY DEPARTMENT Provider Note   CSN: [id number] Arrival date & time: 10/10/19  1640   History Chief Complaint  Patient presents with  . Fever   Logan Weiss is a 49 y.o. male with past medical history significant for perianal abscess, diabetes who presents for evaluation of fever.  Continued fever despite antibiotics at home.  Has noted increased redness pain and drainage.  Consulted his PCP who told him to present to the emergency department for evaluation.  Pain similar to discharge. Taking Tylenol and Ibuprofen for pain.  States wife has noticed a change in the drainage from the wound.  Supposed to be doing twice daily wound care changes however due to pain only able to do once.  Max 102.1 at home denies chest pain, shortness of breath, cough, hemoptysis, abdominal pain, dysuria, diarrhea, constipation.  Denies unilateral leg swelling, redness or warmth.  Tylenol given just PTA, per patient.   Per chart review.  Patient with abscess which was debrided by general surgery.  Was discharged on antibiotics.  Continues to have fever temp max 102 per internal medicine teaching service note despite p.o. antibiotics.  Currently taking Cefdinir.  Is to increased erythema, pain and drainage to area.  History obtained from patient, past medical records, wife in room.  No interpreter is used.  HPI     Past Medical History:  Diagnosis Date  . Cellulitis 11/2017   of face   . Diabetes (HCC)    new dx  . History of MRSA infection     Patient Active Problem List   Diagnosis Date Noted  . Hyperglycemia   . Normocytic anemia 09/30/2019  . Necrotizing soft tissue infection 09/30/2019  . Abscess of multiple sites of perineum 09/27/2019  . Type 2 diabetes mellitus with hyperglycemia (HCC) 11/05/2017  . 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 History of MRSA infection 06/08/2014    Past Surgical History:  Procedure Laterality  Date  . APPENDECTOMY    . INCISION AND DRAINAGE PERIRECTAL ABSCESS Right 09/27/2019   Procedure: IRRIGATION AND DEBRIDEMENT RIGHT THIGH, PERINEUM, AND SCROTUM;  Surgeon: Diamantina Monks, MD;  Location: [facility] OR;  Service: General;  Laterality: Right;  . IRRIGATION AND DEBRIDEMENT ABSCESS Right 09/29/2019   Procedure: IRRIGATION AND DEBRIDEMENT Right perineum and thigh;  Surgeon: Emelia Loron, MD;  Location: [facility] OR;  Service: General;  Laterality: Right;  . KNEE SURGERY    . right knee surgery         Family History  Problem Relation Age of Onset  . Hyperlipidemia Mother   . Mental retardation Mother   . Diabetes Mother   . Diabetes Father   . Hyperlipidemia Father   . Hypertension Father   . Diabetes Maternal Grandmother   . Diabetes Maternal Grandfather   . Diabetes Paternal Grandmother   . Diabetes Paternal Grandfather     Social History   Tobacco Use  . Smoking status: Current Every Day Smoker    Packs/day: 0.00    Years: 5.00    Pack years: 0.00    Types: Cigarettes  . Smokeless tobacco: Never Used  Substance Use Topics  . Alcohol use: No    Alcohol/week: 0.0 standard drinks    Comment: socially  . Drug use: No    Home Medications Prior to Admission medications   Medication Sig Start Date End Date Taking? Authorizing Provider  Ascorbic Acid (VITAMIN C) 100 MG tablet Take 1 tablet by  mouth daily.   Yes [provider]  atorvastatin (LIPITOR) 20 MG tablet Take 20 mg by mouth daily. 09/04/19  Yes [provider]  cefdinir (OMNICEF) 300 MG capsule Take 2 capsules (600 mg total) by mouth daily. 10/08/19  Yes Tyson AliasVincent, Duncan Thomas, MD  Cholecalciferol 125 MCG (5000 UT) capsule Take 1 tablet by mouth daily.   Yes [provider]  GlucoCom Lancets MISC Check blood sugar TID & QHS 06/22/14  Yes Advani, Deepak, MD  glucose blood (CHOICE DM FORA G20 TEST STRIPS) test strip Use as instructed 06/22/14  Yes Advani, Deepak, MD  insulin aspart  (NOVOLOG) 100 UNIT/ML FlexPen Inject 12 Units into the skin 3 (three) times daily with meals. 10/04/19  Yes Seawell, Jaimie A, DO  insulin glargine (LANTUS) 100 UNIT/ML Solostar Pen Inject 35 Units into the skin at bedtime. 10/04/19  Yes Seawell, Jaimie A, DO  oxyCODONE (OXY IR/ROXICODONE) 5 MG immediate release tablet Take 1 tablet (5 mg total) by mouth every 4 (four) hours as needed for up to 7 days for moderate pain or severe pain (5mg  for moderate pain, 10mg  for severe pain). 10/04/19 10/11/19 Yes Seawell, Jaimie A, DO  polyethylene glycol (MIRALAX / GLYCOLAX) 17 g packet Take 17 g by mouth daily. While taking opioid pain medication. 10/05/19  Yes Seawell, Jaimie A, DO  vitamin E 200 UNIT capsule Take 200 Units by mouth daily.   Yes [provider]  cyclobenzaprine (FLEXERIL) 10 MG tablet Take 1 tablet (10 mg total) by mouth 2 (two) times daily as needed for muscle spasms. Patient not taking: Reported on 04/13/2018 03/21/17   Bethel BornGekas, Kelly Marie, PA-C  pantoprazole (PROTONIX) 20 MG tablet Take 1 tablet (20 mg total) by mouth daily. Patient not taking: Reported on 09/27/2019 04/13/18   Margarita Grizzleay, Danielle, MD    Allergies    Metformin and related and Doxycycline  Review of Systems   Review of Systems  Constitutional: Positive for chills, fatigue and fever.  Respiratory: Negative.   Cardiovascular: Negative.   Gastrointestinal: Negative for rectal pain.       Right groin pain  Genitourinary: Negative.   Musculoskeletal: Negative.   Skin: Negative.   Neurological: Negative.   All other systems reviewed and are negative.   Physical Exam Updated Vital Signs BP 122/87   Pulse 94   Temp (!) 101.1 F (38.4 C)   Resp (!) 23   Ht 5\' 6"  (1.676 m)   Wt 80.3 kg   SpO2 96%   BMI 28.57 kg/m   Physical Exam Vitals and nursing note reviewed.  Constitutional:      General: He is not in acute distress.    Appearance: He is well-developed. He is not ill-appearing, toxic-appearing or diaphoretic.   HENT:     Head: Normocephalic and atraumatic.     Nose: Nose normal.     Mouth/Throat:     Mouth: Mucous membranes are moist.     Pharynx: Oropharynx is clear.  Eyes:     Pupils: Pupils are equal, round, and reactive to light.  Cardiovascular:     Rate and Rhythm: Normal rate and regular rhythm.     Pulses: Normal pulses.     Heart sounds: Normal heart sounds.  Pulmonary:     Effort: Pulmonary effort is normal. No respiratory distress.  Abdominal:     General: Bowel sounds are normal. There is no distension.     Palpations: Abdomen is soft.     Tenderness: There is no  abdominal tenderness. There is no right CVA tenderness, left CVA tenderness, guarding or rebound.  Musculoskeletal:        General: Normal range of motion.     Cervical back: Normal range of motion and neck supple.  Skin:    General: Skin is warm and dry.     Capillary Refill: Capillary refill takes less than 2 seconds.     Comments: Deep right inguinal wound which does not extend into the scrotum.  Packing removed which had some serosanguineous and some mild purulent drainage.  No active drainage to wound.  Tender to palpation.  There is some minimally surrounding erythema however I do not appreciate any warmth, induration.  See picture in chart  Neurological:     General: No focal deficit present.     Mental Status: He is alert and oriented to person, place, and time.       ED Results / Procedures / Treatments   Labs (all labs ordered are listed, but only abnormal results are displayed) Labs Reviewed  LACTIC ACID, PLASMA - Abnormal; Notable for the following components:      Result Value   Lactic Acid, Venous 2.1 (*)    All other components within normal limits  COMPREHENSIVE METABOLIC PANEL - Abnormal; Notable for the following components:   Glucose, Bld 173 (*)    Albumin 2.8 (*)    AST 53 (*)    ALT 68 (*)    Alkaline Phosphatase 128 (*)    All other components within normal limits  URINALYSIS,  ROUTINE W REFLEX MICROSCOPIC - Abnormal; Notable for the following components:   Specific Gravity, Urine >1.046 (*)    All other components within normal limits  CULTURE, BLOOD (ROUTINE X 2)  CULTURE, BLOOD (ROUTINE X 2)  URINE CULTURE  SARS CORONAVIRUS 2 (TAT 6-24 HRS)  CBC WITH DIFFERENTIAL/PLATELET  APTT  PROTIME-INR  LACTIC ACID, PLASMA  POC SARS CORONAVIRUS 2 AG -  ED    EKG None  Radiology DG Chest 2 View  Result Date: 10/10/2019 CLINICAL DATA:  Fever, recent abscess drainage EXAM: CHEST - 2 VIEW COMPARISON:  05/14/2018 FINDINGS: The heart size and mediastinal contours are within normal limits. Both lungs are clear. The visualized skeletal structures are unremarkable. IMPRESSION: No acute abnormality of the lungs. Electronically Signed   By: Eddie Candle M.D.   On: 10/10/2019 17:30   CT Abdomen Pelvis W Contrast  Result Date: 10/10/2019 CLINICAL DATA:  Generalized abdominal pain and fevers, recent history of right groin abscess debridement EXAM: CT ABDOMEN AND PELVIS WITH CONTRAST TECHNIQUE: Multidetector CT imaging of the abdomen and pelvis was performed using the standard protocol following bolus administration of intravenous contrast. CONTRAST:  160mL OMNIPAQUE IOHEXOL 300 MG/ML  SOLN COMPARISON:  09/27/2019, 07/20/2010 FINDINGS: Lower chest: No acute abnormality. Hepatobiliary: No focal liver abnormality is seen. No gallstones, gallbladder wall thickening, or biliary dilatation. Pancreas: Unremarkable. No pancreatic ductal dilatation or surrounding inflammatory changes. Spleen: Normal in size without focal abnormality. Adrenals/Urinary Tract: Adrenal glands are within normal limits. Kidneys are well visualized bilaterally. No renal calculi or obstructive changes are seen. The bladder is partially distended. Stomach/Bowel: Fecal material is noted throughout the colon without obstructive or inflammatory changes. The appendix is not visualized consistent with a prior surgical history. No  small bowel or gastric abnormality is seen. Vascular/Lymphatic: Aortic atherosclerosis. Stable reactive lymphadenopathy is noted in the right groin and right iliac chain. Reproductive: Prostate is unremarkable. Other: In the right inguinal region, there is  again noted localized inflammatory change and minimal subcutaneous air consistent with the area of prior subcutaneous infection. No sizable fluid collection is identified to suggest recurrent abscess. The overall appearance has improved significantly in the interval from the prior exam. Musculoskeletal: Within normal limits. IMPRESSION: Significant improvement in the degree of inflammatory change in the right inguinal region and extending into the medial aspect of the right buttock. No definitive recurrent abscess is seen. Electronically Signed   By: Alcide Clever M.D.   On: 10/10/2019 19:12    Procedures .Critical Care Performed by: Linwood Dibbles, PA-C Authorized by: Linwood Dibbles, PA-C   Critical care provider statement:    Critical care time (minutes):  45   Critical care was necessary to treat or prevent imminent or life-threatening deterioration of the following conditions:  Sepsis   Critical care was time spent personally by me on the following activities:  Discussions with consultants, evaluation of patient's response to treatment, examination of patient, ordering and performing treatments and interventions, ordering and review of laboratory studies, ordering and review of radiographic studies, pulse oximetry, re-evaluation of patient's condition, obtaining history from patient or surrogate and review of old charts   (including critical care time)  Medications Ordered in ED Medications  metroNIDAZOLE (FLAGYL) IVPB 500 mg (has no administration in time range)  vancomycin (VANCOREADY) IVPB 1500 mg/300 mL (has no administration in time range)  ceFEPIme (MAXIPIME) 2 g in sodium chloride 0.9 % 100 mL IVPB (has no administration in  time range)  vancomycin (VANCOCIN) IVPB 1000 mg/200 mL premix (has no administration in time range)  sodium chloride flush (NS) 0.9 % injection 3 mL (3 mLs Intravenous Given 10/10/19 2122)  lactated ringers bolus 1,000 mL (0 mLs Intravenous Stopped 10/10/19 2050)    And  lactated ringers bolus 1,000 mL (0 mLs Intravenous Stopped 10/10/19 2050)    And  lactated ringers bolus 500 mL (500 mLs Intravenous New Bag/Given 10/10/19 2122)  ceFEPIme (MAXIPIME) 2 g in sodium chloride 0.9 % 100 mL IVPB (0 g Intravenous Stopped 10/10/19 2121)  iohexol (OMNIPAQUE) 300 MG/ML solution 100 mL (100 mLs Intravenous Contrast Given 10/10/19 1845)  acetaminophen (TYLENOL) tablet 650 mg (650 mg Oral Given 10/10/19 2051)   ED Course  I have reviewed the triage vital signs and the nursing notes.  Pertinent labs & imaging results that were available during my care of the patient were reviewed by me and considered in my medical decision making (see chart for details).  49 year old male presents for evaluation of fever.  Known perirectal abscess which required drainage on prior admission.  Patient arrives with low-grade temperature of 99.4 orally and tachycardic.  Initial labs placed by triage.  Patient febrile(rectal 101.1), tachycardic on arrival with known infection.  Patient given antibiotics, IV fluids and code sepsis called on my initial assessment.  No chest pain, shortness of breath, hemoptysis or DVT on exam to suggest atypical PE as cause of his tachycardia.  Will likely require admission.  Labs and imaging personally reviewed and interpreted CBC without leukocytosis CMP mild elevation in LFTs are negative T bili, mild hyperglycemia to 173 UA pending, however denies UA complaints, No bladder wall thickening on imaging. DG chest without infiltrate, cardiomegaly, pulmonary edema, pneumothorax Lactic acid 2.1 BC pending  Rapid COVID negative EKG without STEMI CT AP with improvement since last imaging. No additional abscess  or fluid collection.  1950: Patient reassessed. Rigors on exam. Give additional antiemetic.  Discussed inpatient management given he continues to  be febrile despite outpatient antibiotics.  Discussed with wife and wife in room.  They agreeable.  His rapid Covid is negative.  2100: Patient reassessed.  Without any complaints.  Family aware of admission.  Pending internal medicine teaching service consult  CONSULT with Dr. Chesley Mires with Internal med teaching service who agrees to assess patient for admission.  The patient appears reasonably stabilized for admission considering the current resources, flow, and capabilities available in the ED at this time, and I doubt any other [facility] requiring further screening and/or treatment in the ED prior to admission.  Patient was personally seen and evaluated by attending physician, Dr. Madilyn Hook agrees with above treatment, plan and disposition.   MDM Rules/Calculators/A&P                       Final Clinical Impression(s) / ED Diagnoses Final diagnoses:  Sepsis with acute organ dysfunction without septic shock, due to unspecified organism, unspecified type (HCC)  Cellulitis of other specified site    Rx / DC Orders ED Discharge Orders    None       Landon Bassford A, PA-C 10/10/19 2144    Tilden Fossa, MD 10/11/19 1121

## 2019-10-10 NOTE — Telephone Encounter (Addendum)
   Reason for call:   I received a call from Mr. Logan Weiss at 2:17 AM indicating he has been having fevers.   Pertinent Data:   Logan Weiss has a history of perineal soft tissue infection with abscess, uncontrolled type 2 diabetes mellitus and was recently admitted to the hospital from September 27, 2019 to October 04, 2019.  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  Recently, he was given another prescription of cefdinir after he called our clinic.  He states that he has been having febrile episodes at 99 F and 101 F.  The wife states that his wound area has been more red and draining.   Assessment / Plan / Recommendations:   Given his history of uncontrolled diabetes and recent perineal infection, I am worried about a developing abscess or inability of proper wound healing especially given that he has been spiking fevers.  I advised them to report to the emergency department for further evaluation and IV antibiotics.  As always, pt is advised that if symptoms worsen or new symptoms arise, they should go to an urgent care facility or to to ER for further evaluation.   Logan Rack, MD   10/10/2019, 2:29 AM

## 2019-10-10 NOTE — ED Triage Notes (Signed)
Pt d/c from hospital 3-28 for s/p I&D right perineal abscess.  Pt has been running fever since discharge despite taking antibiotics.  Eating and drinking WNL.  Taking Tylenol for fevers.

## 2019-10-10 NOTE — ED Notes (Signed)
Unable to obtain 2nd IV site I have already ordered this as a future order in the computer. Placed for Iv team to start 2nd IV.

## 2019-10-10 NOTE — H&P (Addendum)
Date: 10/10/2019               Patient Name:  Logan Weiss MRN: [id number]  DOB: Jan 12, 1971 Age / Sex: 49 y.o., male   PCP: Malka So., MD         Medical Service: Internal Medicine Teaching Service         Attending Physician: Dr. Earl Lagos, MD    First Contact: Dr. Mcarthur Rossetti Pager: [phone]  Second Contact: Dr. Maryla Morrow Pager: [phone]       After Hours (After 5p/  First Contact Pager: [phone]  weekends / holidays): Second Contact Pager: [phone]   Chief Complaint: Fever and Pain  History of Present Illness:  Mr. Logan Weiss is a 49 y/o M, with a PMH of diabetes and perineal abscess, presenting with worsening pain and fever from the site of his perineal abscess. Patient was originally admitted on 09/27/19 and discharged on 10/04/19 for management of his abscess and Completed a 7 days course of flagyl and ceftriaxone. He spoke with IMTS about fevers on 10/08/19 and received a 5 day course of cefdinir.   History was discussed with the patient and his wife at bedside. Mr. Smiles states that over the last 48-72 hours, he has been experiencing fevers, cold chills, and cold sweats. He states that his fever first started 2 days ago at 72.18F and he began to experience chills. Over the course of the next 24 hours his fever increased to 102.20F. He states that he had spoken to the IMTS office on 10/08/19 and had been taking his antibiotic. He states that he only experiences pain at the site of his abscess during packing of the wound. His wife helps with unpacking and packing his gauze, and has noticed over the past 48 hours the drainage from his wound has gone from "clearish pink" to "Dark red" and has increased in production of fluid. Patient denies nausea, vomiting, visual changes, chest pain, dyspnea, abdominal pain, or dysuria. Patient endorses polyuria.   During the ED course, his labs showed a lactic acid of 2.1 and slight transaminitis with an ast/alt ratio of 53/68.  Meds: Current Meds   Medication Sig  . Ascorbic Acid (VITAMIN C) 100 MG tablet Take 1 tablet by mouth daily.  Marland Kitchen atorvastatin (LIPITOR) 20 MG tablet Take 20 mg by mouth daily.  . cefdinir (OMNICEF) 300 MG capsule Take 2 capsules (600 mg total) by mouth daily.  . Cholecalciferol 125 MCG (5000 UT) capsule Take 1 tablet by mouth daily.  . GlucoCom Lancets MISC Check blood sugar TID & QHS  . glucose blood (CHOICE DM FORA G20 TEST STRIPS) test strip Use as instructed  . insulin aspart (NOVOLOG) 100 UNIT/ML FlexPen Inject 12 Units into the skin 3 (three) times daily with meals.  . insulin glargine (LANTUS) 100 UNIT/ML Solostar Pen Inject 35 Units into the skin at bedtime.  Marland Kitchen oxyCODONE (OXY IR/ROXICODONE) 5 MG immediate release tablet Take 1 tablet (5 mg total) by mouth every 4 (four) hours as needed for up to 7 days for moderate pain or severe pain (5mg  for moderate pain, 10mg  for severe pain).  . polyethylene glycol (MIRALAX / GLYCOLAX) 17 g packet Take 17 g by mouth daily. While taking opioid pain medication.  . vitamin E 200 UNIT capsule Take 200 Units by mouth daily.     Allergies: Allergies as of 10/10/2019 - Review Complete 10/10/2019  Allergen Reaction Noted  . Metformin and related Diarrhea and Nausea And  Vomiting 08/04/2019  . Doxycycline Hives 07/28/2017   Past Medical History:  Diagnosis Date  . Cellulitis 11/2017   of face   . Diabetes (HCC)    new dx  . History of MRSA infection     Family History:  Family History  Problem Relation Age of Onset  . Hyperlipidemia Mother   . Mental retardation Mother   . Diabetes Mother   . Diabetes Father   . Hyperlipidemia Father   . Hypertension Father   . Diabetes Maternal Grandmother   . Diabetes Maternal Grandfather   . Diabetes Paternal Grandmother   . Diabetes Paternal Grandfather     Social History:  Lives at home with his wife.  Tobacco: Previous user quit 09/2019. Smoked socially ETOH: Previous user quit 09/2019 Illicit substances: Denies  use.   Review of Systems: A complete ROS was negative except as per HPI.   Physical Exam: Blood pressure 129/83, pulse 93, temperature 98.2 F (36.8 C), temperature source Oral, resp. rate 18, height 5\' 6"  (1.676 m), weight 80.3 kg, SpO2 100 %. Physical Exam Vitals and nursing note reviewed.  Constitutional:      General: He is not in acute distress.    Appearance: Normal appearance. He is not ill-appearing or toxic-appearing.  HENT:     Head: Normocephalic and atraumatic.  Eyes:     General:        Right eye: No discharge.        Left eye: No discharge.     Conjunctiva/sclera: Conjunctivae normal.  Cardiovascular:     Rate and Rhythm: Regular rhythm. Tachycardia present.     Pulses: Normal pulses.     Heart sounds: Normal heart sounds. No murmur. No friction rub. No gallop.   Pulmonary:     Effort: Pulmonary effort is normal.     Breath sounds: Normal breath sounds. No wheezing, rhonchi or rales.  Abdominal:     General: Bowel sounds are normal.     Palpations: Abdomen is soft.     Tenderness: There is no abdominal tenderness. There is no guarding.  Genitourinary:    Comments: R inguinal wound, unpacked with serosanguinous drainage. Pain to palpation with erythema noted around the lesion.   Musculoskeletal:        General: No swelling.     Right lower leg: No edema.     Left lower leg: No edema.  Skin:    General: Skin is warm.     Findings: Erythema present. No bruising, lesion or rash.  Neurological:     General: No focal deficit present.     Mental Status: He is alert and oriented to person, place, and time.  Psychiatric:        Mood and Affect: Mood normal.        Behavior: Behavior normal.       EKG: personally reviewed my interpretation is NSR unchanged from prior  CXR: personally reviewed my interpretation is no acute cardiopulmonary processes.   Assessment & Plan by Problem: Active Problems:   Sepsis (HCC)  Logan Weiss is a 49 y/o Male with a PMH of  DM, being admitted for sepsis.   Sepsis:  Patient with prior perineal abscess, drained by surgery during his last hospitalization presents with elevated fevers, lactic acidosis, mild transaminitis, and tachycardia is  concerning for sepsis. Patient previously treated with 7 day course of flagyl and ceftriaxone. During admission, imaging did not show recurrence of abscess formation, or signs of gangrene. Patient was given  2L bolus of LR in the ED. Patient started on broad spectrum antibiotics during his ED course.  - Flagyl - Vancomycin - Cefepime   - Wound care consult - Blood cultures pending - Trend lactic acid  - Trend CBC  Uncontrolled Diabetes Mellitis Type II:  Previous A1c of 13.3 - Novolog 12U TID - 35U Lantus  Dispo: Admit patient to Inpatient with expected length of stay greater than 2 midnights.  Signed: Maudie Mercury, MD 10/10/2019, 11:33 PM

## 2019-10-10 NOTE — Sepsis Progress Note (Signed)
Antibiotics delayed d/t difficult IV stick. No access.

## 2019-10-10 NOTE — Progress Notes (Signed)
Pharmacy Antibiotic Note  Logan Weiss is a 49 y.o. male admitted on 10/10/2019 with sepsis, recent admission with perineal infection/abscess (Abscess Cx grew out MSSA + strep (pcn intermediate) went home on omnicef).  Pharmacy has been consulted for vancomycin and cefepime dosing.  LA 2.1, febrile, WBC wnl  Plan: Vancomycin 1500 mg IV x 1, then 1000mg  IV every 12 hours Goal AUC 400-550. Expected AUC: 461 SCr used: 0.97 Cefepime 2g IV every 8 hours Monitor renal function, Cx and clinical progression to narrow Vancomycin levels at steady state   Height: 5\' 6"  (167.6 cm) Weight: 80.3 kg (177 lb) IBW/kg (Calculated) : 63.8  Temp (24hrs), Avg:100.3 F (37.9 C), Min:99.4 F (37.4 C), Max:101.1 F (38.4 C)  No results for input(s): WBC, CREATININE, LATICACIDVEN, VANCOTROUGH, VANCOPEAK, VANCORANDOM, GENTTROUGH, GENTPEAK, GENTRANDOM, TOBRATROUGH, TOBRAPEAK, TOBRARND, AMIKACINPEAK, AMIKACINTROU, AMIKACIN in the last 168 hours.  Estimated Creatinine Clearance: 113.7 mL/min (by C-G formula based on SCr of 0.78 mg/dL).    Allergies  Allergen Reactions  . Metformin And Related Diarrhea and Nausea And Vomiting  . Doxycycline Hives    , PharmD Clinical Pharmacist ED Pharmacist Phone # [phone] 10/10/2019 5:38 PM

## 2019-10-11 DIAGNOSIS — E118 Type 2 diabetes mellitus with unspecified complications: Secondary | ICD-10-CM

## 2019-10-11 DIAGNOSIS — B9561 Methicillin susceptible Staphylococcus aureus infection as the cause of diseases classified elsewhere: Secondary | ICD-10-CM

## 2019-10-11 DIAGNOSIS — B954 Other streptococcus as the cause of diseases classified elsewhere: Secondary | ICD-10-CM

## 2019-10-11 DIAGNOSIS — Z881 Allergy status to other antibiotic agents status: Secondary | ICD-10-CM

## 2019-10-11 DIAGNOSIS — B951 Streptococcus, group B, as the cause of diseases classified elsewhere: Secondary | ICD-10-CM

## 2019-10-11 DIAGNOSIS — L02215 Cutaneous abscess of perineum: Secondary | ICD-10-CM

## 2019-10-11 DIAGNOSIS — E11628 Type 2 diabetes mellitus with other skin complications: Secondary | ICD-10-CM

## 2019-10-11 DIAGNOSIS — L089 Local infection of the skin and subcutaneous tissue, unspecified: Secondary | ICD-10-CM

## 2019-10-11 DIAGNOSIS — R748 Abnormal levels of other serum enzymes: Secondary | ICD-10-CM

## 2019-10-11 DIAGNOSIS — E1152 Type 2 diabetes mellitus with diabetic peripheral angiopathy with gangrene: Secondary | ICD-10-CM

## 2019-10-11 DIAGNOSIS — Z888 Allergy status to other drugs, medicaments and biological substances status: Secondary | ICD-10-CM

## 2019-10-11 DIAGNOSIS — R509 Fever, unspecified: Secondary | ICD-10-CM | POA: Diagnosis present

## 2019-10-11 DIAGNOSIS — I96 Gangrene, not elsewhere classified: Secondary | ICD-10-CM

## 2019-10-11 DIAGNOSIS — B9689 Other specified bacterial agents as the cause of diseases classified elsewhere: Secondary | ICD-10-CM

## 2019-10-11 LAB — CBC
HCT: 39.8 % (ref 39.0–52.0)
Hemoglobin: 13 g/dL (ref 13.0–17.0)
MCH: 30.5 pg (ref 26.0–34.0)
MCHC: 32.7 g/dL (ref 30.0–36.0)
MCV: 93.4 fL (ref 80.0–100.0)
Platelets: 245 10*3/uL (ref 150–400)
RBC: 4.26 MIL/uL (ref 4.22–5.81)
RDW: 13.2 % (ref 11.5–15.5)
WBC: 4.1 10*3/uL (ref 4.0–10.5)
nRBC: 0 % (ref 0.0–0.2)

## 2019-10-11 LAB — BASIC METABOLIC PANEL
Anion gap: 10 (ref 5–15)
BUN: 5 mg/dL — ABNORMAL LOW (ref 6–20)
CO2: 25 mmol/L (ref 22–32)
Calcium: 8.4 mg/dL — ABNORMAL LOW (ref 8.9–10.3)
Chloride: 102 mmol/L (ref 98–111)
Creatinine, Ser: 0.88 mg/dL (ref 0.61–1.24)
GFR calc Af Amer: >60 mL/min (ref >60)
GFR calc non Af Amer: >60 mL/min (ref >60)
Glucose, Bld: 216 mg/dL — ABNORMAL HIGH (ref 70–99)
Potassium: 3.7 mmol/L (ref 3.5–5.1)
Sodium: 137 mmol/L (ref 135–145)

## 2019-10-11 LAB — GLUCOSE, CAPILLARY
Glucose-Capillary: 103 mg/dL — ABNORMAL HIGH (ref 70–99)
Glucose-Capillary: 141 mg/dL — ABNORMAL HIGH (ref 70–99)
Glucose-Capillary: 175 mg/dL — ABNORMAL HIGH (ref 70–99)
Glucose-Capillary: 177 mg/dL — ABNORMAL HIGH (ref 70–99)
Glucose-Capillary: 72 mg/dL (ref 70–99)

## 2019-10-11 LAB — HEPATIC FUNCTION PANEL
ALT: 57 U/L — ABNORMAL HIGH (ref 0–44)
AST: 47 U/L — ABNORMAL HIGH (ref 15–41)
Albumin: 2.5 g/dL — ABNORMAL LOW (ref 3.5–5.0)
Alkaline Phosphatase: 102 U/L (ref 38–126)
Bilirubin, Direct: 0.1 mg/dL (ref 0.0–0.2)
Indirect Bilirubin: 0.3 mg/dL (ref 0.3–0.9)
Total Bilirubin: 0.4 mg/dL (ref 0.3–1.2)
Total Protein: 6.3 g/dL — ABNORMAL LOW (ref 6.5–8.1)

## 2019-10-11 LAB — LACTIC ACID, PLASMA: Lactic Acid, Venous: 2.1 mmol/L (ref 0.5–1.9)

## 2019-10-11 LAB — SARS CORONAVIRUS 2 (TAT 6-24 HRS): SARS Coronavirus 2: NEGATIVE

## 2019-10-11 MED ORDER — ACETAMINOPHEN 325 MG PO TABS
650.0000 mg | ORAL_TABLET | Freq: Four times a day (QID) | ORAL | Status: DC | PRN
  Administered 2019-10-11 (×3): 650 mg via ORAL
  Filled 2019-10-11 (×3): qty 2

## 2019-10-11 MED ORDER — OXYCODONE HCL 5 MG PO TABS
5.0000 mg | ORAL_TABLET | ORAL | Status: DC | PRN
  Administered 2019-10-11: 10 mg via ORAL
  Administered 2019-10-11: 5 mg via ORAL
  Administered 2019-10-11 – 2019-10-14 (×9): 10 mg via ORAL
  Filled 2019-10-11 (×11): qty 2

## 2019-10-11 NOTE — Progress Notes (Signed)
Patient ID: Logan Weiss, male   DOB: 08-30-1970, 49 y.o.   MRN: [id number]         [facility] for Infectious Disease  Date of Admission:  10/10/2019    Total days of antibiotics 4        Day 2 vancomycin        Day 2 cefepime ASSESSMENT: The cause of his recent fevers is not entirely clear.  I do not see any evidence of ongoing infection in his wound and no deep abscess or osteomyelitis was seen on the CT scan.  He may have some thrombophlebitis at a previous right forearm IV site.  His liver enzymes are elevated but coming down.  I suspect that that is a nonspecific inflammatory response.  Recommend continuing current antibiotics pending further observation and blood culture results.  PLAN: 1. Continue current antibiotics 2. Await blood culture results  Principal Problem:   Fever Active Problems:   Type 2 diabetes mellitus with hyperglycemia (HCC)   Abscess of multiple sites of perineum   Normocytic anemia   Necrotizing soft tissue infection   Sepsis (HCC)   Elevated liver enzymes   Scheduled Meds: . atorvastatin  20 mg Oral Daily  . enoxaparin (LOVENOX) injection  40 mg Subcutaneous Q24H  . insulin aspart  12 Units Subcutaneous TID WC  . insulin glargine  35 Units Subcutaneous QHS   Continuous Infusions: . ceFEPime (MAXIPIME) IV Stopped (10/11/19 1005)  . vancomycin Stopped (10/11/19 0921)   PRN Meds:.acetaminophen, oxyCODONE   SUBJECTIVE: Logan Weiss is a 49 year old with poorly controlled diabetes who was admitted to the hospital on 09/26/2019 with severe necrotizing perineal infection.  He underwent multiple debridements.  Operative cultures grew MSSA, group B strep, strep constellatus and Prevotella.  He was treated with ceftriaxone and metronidazole with improvement.  He was afebrile and feeling much better.  Antibiotics were stopped upon discharge on 10/04/2019.  Several days later he began to develop recurrent fever.  The only other change that he and his wife noted  were some increased bleeding from his perineal wound.  There was no purulence and no odor.  He denies any diarrhea, dysuria, worsening cough or shortness of breath.  He spoke with his PCP and was started on empiric cefdinir on 10/08/2019 but had persistent fever leading to admission yesterday.  Review of Systems: Review of Systems  Constitutional: Positive for chills, fever and malaise/fatigue. Negative for diaphoresis and weight loss.  HENT: Negative for congestion and sore throat.   Respiratory: Positive for cough. Negative for sputum production and shortness of breath.   Cardiovascular: Negative for chest pain.  Gastrointestinal: Negative for abdominal pain, diarrhea, nausea and vomiting.  Genitourinary: Negative for dysuria.       He says he has a normal amount of pain around his wound.  This has not increased recently.  Musculoskeletal: Negative for back pain and joint pain.  Skin: Negative for itching and rash.  Neurological: Negative for headaches.    Allergies  Allergen Reactions  . Metformin And Related Diarrhea and Nausea And Vomiting  . Doxycycline Hives    OBJECTIVE: Vitals:   10/11/19 1345 10/11/19 1350 10/11/19 1441 10/11/19 1542  BP: 122/66 125/76    Pulse: (!) 113  (!) 108   Resp: 18 18 16    Temp: (!) 101.6 F (38.7 C) (!) 100.6 F (38.1 C) 100 F (37.8 C) 99.7 F (37.6 C)  TempSrc: Oral Oral Oral Oral  SpO2: 100% 100% 98%   Weight:  Height:       Body mass index is 28.57 kg/m.  Physical Exam Constitutional:      Comments: He is resting quietly in bed talking to his wife on his phone.  He is very pleasant as usual.  Cardiovascular:     Rate and Rhythm: Normal rate and regular rhythm.     Heart sounds: No murmur.  Pulmonary:     Effort: Pulmonary effort is normal.     Breath sounds: Normal breath sounds.  Abdominal:     Palpations: Abdomen is soft.     Tenderness: There is no abdominal tenderness.  Musculoskeletal:        General: No swelling or  tenderness.  Skin:    Findings: No rash.     Comments: He does have some tenderness and hyperpigmentation at the site of the previous IV on his right forearm.  Neurological:     General: No focal deficit present.  Psychiatric:        Mood and Affect: Mood normal.       Lab Results Lab Results  Component Value Date   WBC 4.1 10/11/2019   HGB 13.0 10/11/2019   HCT 39.8 10/11/2019   MCV 93.4 10/11/2019   PLT 245 10/11/2019    Lab Results  Component Value Date   CREATININE 0.88 10/11/2019   BUN 5 (L) 10/11/2019   NA 137 10/11/2019   K 3.7 10/11/2019   CL 102 10/11/2019   CO2 25 10/11/2019    Lab Results  Component Value Date   ALT 57 (H) 10/11/2019   AST 47 (H) 10/11/2019   ALKPHOS 102 10/11/2019   BILITOT 0.4 10/11/2019     Microbiology: Recent Results (from the past 240 hour(s))  Blood culture (routine x 2)     Status: None (Preliminary result)   Collection Time: 10/10/19  5:50 PM   Specimen: BLOOD  Result Value Ref Range Status   Specimen Description BLOOD RIGHT HAND  Final   Special Requests   Final    BOTTLES DRAWN AEROBIC AND ANAEROBIC Blood Culture results may not be optimal due to an inadequate volume of blood received in culture bottles   Culture   Final    NO GROWTH < 12 HOURS Performed at [facility], [address] [place], [place] [address]    Report Status PENDING  Incomplete  Blood culture (routine x 2)     Status: None (Preliminary result)   Collection Time: 10/10/19  6:25 PM   Specimen: BLOOD  Result Value Ref Range Status   Specimen Description BLOOD LEFT FOREARM  Final   Special Requests   Final    BOTTLES DRAWN AEROBIC AND ANAEROBIC Blood Culture results may not be optimal due to an inadequate volume of blood received in culture bottles   Culture   Final    NO GROWTH < 12 HOURS Performed at [facility], [address], [place], [place] [address]    Report Status PENDING  Incomplete  SARS CORONAVIRUS 2 (TAT 6-24 HRS)  Nasopharyngeal     Status: None   Collection Time: 10/10/19  9:23 PM   Specimen: Nasopharyngeal  Result Value Ref Range Status   SARS Coronavirus 2 NEGATIVE NEGATIVE Final    Comment: (NOTE) SARS-CoV-2 target nucleic acids are NOT DETECTED. The SARS-CoV-2 RNA is generally detectable in upper and lower respiratory specimens during the acute phase of infection. Negative results do not preclude SARS-CoV-2 infection, do not rule out co-infections with  other pathogens, and should not be used as the sole basis for treatment or other patient management decisions. Negative results must be combined with clinical observations, patient history, and epidemiological information. The expected result is Negative. Fact Sheet for Patients: SugarRoll.be Fact Sheet for Healthcare Providers: https://www.woods-mathews.com/ This test is not yet approved or cleared by the [place] FDA and  has been authorized for detection and/or diagnosis of SARS-CoV-2 by FDA under an Emergency Use Authorization (EUA). This EUA will remain  in effect (meaning this test can be used) for the duration of the COVID-19 declaration under Section 56 4(b)(1) of the Act, 21 U.S.C. section 360bbb-3(b)(1), unless the authorization is terminated or revoked sooner. Performed at [facility], [place] [address] [place], [place] [address]     Michel Bickers, [place] [facility] [place] [facility] [phone] pager   [phone] cell 10/11/2019, 4:50 PM

## 2019-10-11 NOTE — Progress Notes (Signed)
   10/11/19 1350  MEWS Score  MEWS Temp 1  MEWS Systolic 0  MEWS Pulse 2  MEWS RR 0  MEWS LOC 0  MEWS Score 3  MEWS Score Color Yellow    .Marland Kitchen  Vital Signs MEWS/VS Documentation      10/11/2019 0930 10/11/2019 1316 10/11/2019 1345 10/11/2019 1350   MEWS Score:  0  1  4  3    MEWS Score Color:  Green  Green  Red  Yellow   Resp:  16  18  18  18    Pulse:  85  (!) 105  (!) 113  --   BP:  121/70  116/73  122/66  125/76   Temp:  99 F (37.2 C)  99.6 F (37.6 C)  (!) 101.6 F (38.7 C)  (!) 100.6 F (38.1 C)   O2 Device:  Room   Level of Consciousness:  Alert  --  --  Alert     RN called by NT about patient RED status. RN arrived to room, reassessed vitals and patient a Yellow MEWS due to temperature and HR. Tylenol given. MD notified.

## 2019-10-11 NOTE — Progress Notes (Signed)
Pt's dressing fell out. RN unable to change at this time due to patient requesting pre treatment with oxycodone. Oxycodone given.

## 2019-10-11 NOTE — Progress Notes (Signed)
Received patient from ED via stretcher. Patient alert and oriented to person place and time. I oriented patient to his room and cardiac monitor attached to patient. MD orders reviewed with patient. I was told by ED nurse during report that patient has antibiotics not given because they had a hard time finding a vein . I informed on call MD patient has couple of antibiotic delayed due to the fact that patient has only one IV access.  Will consult IV team for additional IV line.

## 2019-10-11 NOTE — Progress Notes (Signed)
Patient requested to use use the bathroom. Noticed heart rate in the 160's whiles walking to the bathroom.   Patient also had bright red blood profusely coming out of his groin. MD paged.VVS taken.

## 2019-10-11 NOTE — Progress Notes (Signed)
Unable to do dressing change, patient is requesting another hour before nurse can do dressing change. I will pass it on to oncoming RN. Oxycodone give [phone]

## 2019-10-11 NOTE — Progress Notes (Signed)
Rapid response nurse notified of the acute change in patient status. On call MD paged. Will continue to monitor patient and follow MEWS protocol.

## 2019-10-11 NOTE — Progress Notes (Signed)
IMTS staff paged at 0535 concerning tachycardia with HR in the 160s and bleeding from patient's perineal site. Dr. Chesley Mires and Dr. Sande Brothers evaluated the patient at bedside. Patient was AAOx3, able to answer questions appropriately, and found to be in stable condition. Clotted blood was noted around the site and down the patient's R. lower extremity. No active bleeding was observed.   Plan:  - Give PRN Oxycodone 10 mg - Wet to dry dressing BID  - Will appreciate recommendations by wound care.   Dolan Amen, MD IMTS, PGY-1 10/11/2019,6:13 AM

## 2019-10-11 NOTE — Plan of Care (Signed)
Pt requesting RN to come back to obtain vitals due to listening to church service on phone.

## 2019-10-11 NOTE — Progress Notes (Addendum)
   Subjective:   Pt seen at the bedside this AM. Pt states that he feels well this AM and does not have pain at the wound site. Pt said he has been taking the cefdinir as prescribed and still was having fevers. Wife is also at the bedside and said the wound appeared more swollen, red, and had increase discharge. All questions were addressed appropriately.   Objective:  Vital signs in last 24 hours: Vitals:   10/11/19 0748 10/11/19 0802 10/11/19 0831 10/11/19 0930  BP:  102/84  121/70  Pulse:  98  85  Resp: 16 18 17 16   Temp:  98.8 F (37.1 C)  99 F (37.2 C)  TempSrc:  Oral  Oral  SpO2:  99%  98%  Weight:      Height:       Physical Exam: Physical Exam  Constitutional: Well-developed and well-nourished. No acute distress.  Head: Normocephalic and atraumatic.  Eyes: Conjunctivae are normal, EOM nl Cardiovascular:  RRR, nl S1S2, no murmur,  no LEE Respiratory: Effort normal and breath sounds normal. No respiratory distress. No wheezes.  GI: Soft. Bowel sounds are normal. No distension. There is no tenderness.  Neurological: Is alert and oriented x 3  Skin: open wound with some bloody drainage at base at right inguinal area. No active bleeding at this point. No puss, no major tenderness, no visible evidence of abscess formation around the area  Assessment/Plan:  Active Problems:   Sepsis (HCC)   Fever History of recent hospitalization for perineal soft tissue infection with abscess status post debridement and drainage by surgery and IV antibiotic Clinda/bank/meropenem> narrowed down to ceftriaxone/Flagyl, for total of 7 days, and outpatient treatment with doxycycline.  Full developed fever at home, initially prescribed cefdinir.  He continued to have fever despite being adherent to antibiotic at home. He presented to emergency department yesterday, no leukocytosis, lactic acid elevated at 2.1.  Evidence of active infection in the area and CT scan on arrival did not show  evidence of abscess in the area. Unclear why he developed fever On cefdinir while on antibiotic at home and does not appear to have new abscess formation in the area.   Review of systems otherwise negative. He has bacteriuria and leukocyte and UA but no urinary symptoms. Had elevated liver enzyme on arrival but trended down this morning and less likely to be acute hepatitis and a cause of fever.  COVID-19 negative We will continue antibiotic: Vanco and cefepime and consult ID for further recommendation. -Continue Vanco and cefepime (start date 10/10/2019) -Consulted ID, appreciate recommendation -Follow-up blood culture -Appreciate wound care follow-up and recommendation -Follow-up urine culture -Continue oxycodone 5-10 mg every 4 hours as needed for pain  DM type II: Uncontrolled, hemoglobin A1c 13.3 -Continue NovoLog 12 units 3 times daily with meal, Lantus 35 units at bedtime -CBG monitoring  Patient with uncontrolled diabetes, history of perineal soft tissue infection and abscess status post  Dispo: Anticipated discharge in 3-4 days  12/10/2019, MD 10/11/2019, 12:38 PM Pager: [phone]

## 2019-10-12 ENCOUNTER — Other Ambulatory Visit: Payer: Self-pay | Admitting: *Deleted

## 2019-10-12 DIAGNOSIS — I808 Phlebitis and thrombophlebitis of other sites: Secondary | ICD-10-CM

## 2019-10-12 DIAGNOSIS — R7401 Elevation of levels of liver transaminase levels: Secondary | ICD-10-CM

## 2019-10-12 LAB — CBC
HCT: 36.9 % — ABNORMAL LOW (ref 39.0–52.0)
Hemoglobin: 12.1 g/dL — ABNORMAL LOW (ref 13.0–17.0)
MCH: 30.3 pg (ref 26.0–34.0)
MCHC: 32.8 g/dL (ref 30.0–36.0)
MCV: 92.5 fL (ref 80.0–100.0)
Platelets: 225 10*3/uL (ref 150–400)
RBC: 3.99 MIL/uL — ABNORMAL LOW (ref 4.22–5.81)
RDW: 13.1 % (ref 11.5–15.5)
WBC: 4 10*3/uL (ref 4.0–10.5)
nRBC: 0 % (ref 0.0–0.2)

## 2019-10-12 LAB — BASIC METABOLIC PANEL
Anion gap: 10 (ref 5–15)
BUN: <5 mg/dL — ABNORMAL LOW (ref 6–20)
CO2: 24 mmol/L (ref 22–32)
Calcium: 8.2 mg/dL — ABNORMAL LOW (ref 8.9–10.3)
Chloride: 103 mmol/L (ref 98–111)
Creatinine, Ser: 0.78 mg/dL (ref 0.61–1.24)
GFR calc Af Amer: >60 mL/min (ref >60)
GFR calc non Af Amer: >60 mL/min (ref >60)
Glucose, Bld: 166 mg/dL — ABNORMAL HIGH (ref 70–99)
Potassium: 4.3 mmol/L (ref 3.5–5.1)
Sodium: 137 mmol/L (ref 135–145)

## 2019-10-12 LAB — LACTIC ACID, PLASMA: Lactic Acid, Venous: 2.3 mmol/L (ref 0.5–1.9)

## 2019-10-12 LAB — URINE CULTURE: Culture: <10000 — AB

## 2019-10-12 LAB — HEPATIC FUNCTION PANEL
ALT: 66 U/L — ABNORMAL HIGH (ref 0–44)
AST: 53 U/L — ABNORMAL HIGH (ref 15–41)
Albumin: 2.3 g/dL — ABNORMAL LOW (ref 3.5–5.0)
Alkaline Phosphatase: 123 U/L (ref 38–126)
Bilirubin, Direct: 0.3 mg/dL — ABNORMAL HIGH (ref 0.0–0.2)
Indirect Bilirubin: 0.6 mg/dL (ref 0.3–0.9)
Total Bilirubin: 0.9 mg/dL (ref 0.3–1.2)
Total Protein: 6 g/dL — ABNORMAL LOW (ref 6.5–8.1)

## 2019-10-12 LAB — GLUCOSE, CAPILLARY
Glucose-Capillary: 136 mg/dL — ABNORMAL HIGH (ref 70–99)
Glucose-Capillary: 209 mg/dL — ABNORMAL HIGH (ref 70–99)
Glucose-Capillary: 146 mg/dL — ABNORMAL HIGH (ref 70–99)
Glucose-Capillary: 208 mg/dL — ABNORMAL HIGH (ref 70–99)

## 2019-10-12 NOTE — Consult Note (Signed)
WOC Nurse Consult Note: Patient receiving care in [facility] [phone].  Spouse at bedside. Reason for Consult: "perianal abscess" Wound type: Right groin surgical site for previously treated abscess.  CCS performed surgery to the area 3/21/2 Pressure Injury POA: Yes/No/NA Measurement: Wound bed: 100% pink Drainage (amount, consistency, odor) serosanginous on existing dressing Periwound: intact Dressing procedure/placement/frequency:  Place saline moistened gauze into the right groin wound. Cover with dry gauze and ABD pads. Secure with mesh underwear.  I have sent a SecureChat message to Dr. Tenny Craw, asking if they have let the surgical service CCS know the patient is in the hospital getting IV antibiotics for the groin wound.  Monitor the wound area(s) for worsening of condition such as: Signs/symptoms of infection,  Increase in size,  Development of or worsening of odor, Development of pain, or increased pain at the affected locations.  Notify the medical team if any of these develop.  Thank you for the consult.  Discussed plan of care with the patient and his spouse.  WOC nurse will not follow at this time.  Please re-consult the WOC team if needed.  Helmut Muster, RN, MSN, CWOCN, CNS-BC, pager [phone]

## 2019-10-12 NOTE — Progress Notes (Signed)
Patient ID: Logan Weiss, male   DOB: 1970-12-18, 49 y.o.   MRN: [id number]         [facility] for Infectious Disease  Date of Admission:  10/10/2019    Total days of antibiotics 5        Day 3 vancomycin        Day 3 cefepime ASSESSMENT: I suspect that his recent fevers were due to IV associated thrombophlebitis in his right arm.  He is improving on empiric antibiotics.  His fevers are trending down.  Blood cultures remain negative at 48 hours.  If he continues to do well I would consider sending him home tomorrow on cefdinir (already has this at home) and doxycycline for 4 more days.  PLAN: 1. Continue current antibiotics 2. Await blood culture results  Principal Problem:   Fever Active Problems:   Type 2 diabetes mellitus with hyperglycemia (HCC)   Abscess of multiple sites of perineum   Normocytic anemia   Necrotizing soft tissue infection   Sepsis (HCC)   Elevated liver enzymes   Scheduled Meds: . atorvastatin  20 mg Oral Daily  . enoxaparin (LOVENOX) injection  40 mg Subcutaneous Q24H  . insulin aspart  12 Units Subcutaneous TID WC  . insulin glargine  35 Units Subcutaneous QHS   Continuous Infusions: . ceFEPime (MAXIPIME) IV 2 g (10/12/19 0950)  . vancomycin 1,000 mg (10/12/19 0533)   PRN Meds:.acetaminophen, oxyCODONE   SUBJECTIVE: He is feeling better other than some new discomfort at the site where an IV was removed on his right wrist last night.  Review of Systems: Review of Systems  Constitutional: Positive for malaise/fatigue. Negative for chills, diaphoresis, fever and weight loss.  HENT: Negative for congestion and sore throat.   Respiratory: Positive for cough. Negative for sputum production and shortness of breath.   Cardiovascular: Negative for chest pain.  Gastrointestinal: Negative for abdominal pain, diarrhea, nausea and vomiting.  Genitourinary: Negative for dysuria.  Musculoskeletal: Negative for back pain and joint pain.  Skin: Negative  for itching and rash.  Neurological: Negative for headaches.    Allergies  Allergen Reactions  . Metformin And Related Diarrhea and Nausea And Vomiting  . Doxycycline Hives    OBJECTIVE: Vitals:   10/11/19 2205 10/12/19 0119 10/12/19 0515 10/12/19 0940  BP: 136/81 113/74 111/74   Pulse: 99 89 93   Resp:  17 17   Temp: 100.1 F (37.8 C) 99.1 F (37.3 C) 98.5 F (36.9 C) 99.2 F (37.3 C)  TempSrc: Oral Oral Oral Oral  SpO2: 99% 96% 100%   Weight:      Height:       Body mass index is 28.57 kg/m.  Physical Exam Constitutional:      Comments: He is resting quietly in bed.  His wife is visiting.  Cardiovascular:     Rate and Rhythm: Normal rate and regular rhythm.     Heart sounds: No murmur.  Pulmonary:     Effort: Pulmonary effort is normal.     Breath sounds: Normal breath sounds.  Abdominal:     Palpations: Abdomen is soft.     Tenderness: There is no abdominal tenderness.  Musculoskeletal:        General: No swelling or tenderness.  Skin:    Findings: No rash.     Comments: The area of tenderness on his proximal right forearm is currently covered with IV tubing and tape.  It does not appear quite as tender as it  was yesterday afternoon.  He has a new area of tenderness on his right wrist where an IV was removed last night.  Neurological:     General: No focal deficit present.  Psychiatric:        Mood and Affect: Mood normal.       Lab Results Lab Results  Component Value Date   WBC 4.0 10/12/2019   HGB 12.1 (L) 10/12/2019   HCT 36.9 (L) 10/12/2019   MCV 92.5 10/12/2019   PLT 225 10/12/2019    Lab Results  Component Value Date   CREATININE 0.78 10/12/2019   BUN <5 (L) 10/12/2019   NA 137 10/12/2019   K 4.3 10/12/2019   CL 103 10/12/2019   CO2 24 10/12/2019    Lab Results  Component Value Date   ALT 66 (H) 10/12/2019   AST 53 (H) 10/12/2019   ALKPHOS 123 10/12/2019   BILITOT 0.9 10/12/2019     Microbiology: Recent Results (from the past  240 hour(s))  Blood culture (routine x 2)     Status: None (Preliminary result)   Collection Time: 10/10/19  5:50 PM   Specimen: BLOOD  Result Value Ref Range Status   Specimen Description BLOOD RIGHT HAND  Final   Special Requests   Final    BOTTLES DRAWN AEROBIC AND ANAEROBIC Blood Culture results may not be optimal due to an inadequate volume of blood received in culture bottles   Culture   Final    NO GROWTH 2 DAYS Performed at [facility], [place] [address] [place], [place] [address]    Report Status PENDING  Incomplete  Blood culture (routine x 2)     Status: None (Preliminary result)   Collection Time: 10/10/19  6:25 PM   Specimen: BLOOD  Result Value Ref Range Status   Specimen Description BLOOD LEFT FOREARM  Final   Special Requests   Final    BOTTLES DRAWN AEROBIC AND ANAEROBIC Blood Culture results may not be optimal due to an inadequate volume of blood received in culture bottles   Culture   Final    NO GROWTH 2 DAYS Performed at [facility], [place] [address], [place], [place] [address]    Report Status PENDING  Incomplete  Urine culture     Status: Abnormal   Collection Time: 10/10/19  9:23 PM   Specimen: Urine, Random  Result Value Ref Range Status   Specimen Description URINE, RANDOM  Final   Special Requests NONE  Final   Culture (A)  Final    <10,000 COLONIES/mL INSIGNIFICANT GROWTH Performed at [facility], [place] [address], [place], [place] [address]    Report Status 10/12/2019 FINAL  Final  SARS CORONAVIRUS 2 (TAT 6-24 HRS) Nasopharyngeal     Status: None   Collection Time: 10/10/19  9:23 PM   Specimen: Nasopharyngeal  Result Value Ref Range Status   SARS Coronavirus 2 NEGATIVE NEGATIVE Final    Comment: (NOTE) SARS-CoV-2 target nucleic acids are NOT DETECTED. The SARS-CoV-2 RNA is generally detectable in upper and lower respiratory specimens during the acute phase of infection. Negative results do not preclude SARS-CoV-2 infection, do  not rule out co-infections with other pathogens, and should not be used as the sole basis for treatment or other patient management decisions. Negative results must be combined with clinical observations, patient history, and epidemiological information. The expected result is Negative. Fact Sheet for Patients: SugarRoll.be Fact Sheet for Healthcare Providers: https://www.woods-mathews.com/ This test is  not yet approved or cleared by the [place] and  has been authorized for detection and/or diagnosis of SARS-CoV-2 by FDA under an Emergency Use Authorization (EUA). This EUA will remain  in effect (meaning this test can be used) for the duration of the COVID-19 declaration under Section 56 4(b)(1) of the Act, 21 U.S.C. section 360bbb-3(b)(1), unless the authorization is terminated or revoked sooner. Performed at [facility], [address], [place], [place] [address]     Cliffton Asters, MD [facility] for Infectious Disease [facility] [phone] pager   [phone] cell 10/12/2019, 12:31 PM

## 2019-10-12 NOTE — Patient Outreach (Signed)
[facility]) Care Management  10/12/2019  Logan Weiss 07-Dec-1970 [id number]   Received notification through [facility] / Epic in-basket message, patient was readmitted to hospital on 10/10/2019 and patient remains hospitalized.   Sent in-basket message to  [facility] ([name], [place] , and Christophe Louis), advised patient was readmitted to hospital on 10/10/2019, remains hospitalized, and requested follow up for discharge planning / disposition.  No patient telephonic outreach at this time and RNCM will follow up for transition of care follow up if hospitalization is less than 10 days.     [name] H. Gardiner Barefoot, BSN, CCM [facility] Care Management [facility] Telephonic CM Phone: [phone] Fax: [phone]

## 2019-10-12 NOTE — Progress Notes (Signed)
Subjective: HD#2 Overnight, no acute events reported.  This morning, patient evaluated at bedside. He is doing well. Appetite is OK. Denies any current or prior UTI symptoms, cough, sick contact or GI symptoms. He explains that he had fever at home. Per wife there was bloody drainage from the wound area. We discussed the plan for continuing antibiotic and when he will be 24 h fever free will switch him to oral antibiotic. All questions were addressed.   Objective:  Vital signs in last 24 hours: Vitals:   10/11/19 1706 10/11/19 2205 10/12/19 0119 10/12/19 0515  BP:  136/81 113/74 111/74  Pulse: 97 99 89 93  Resp:   17 17  Temp: 99.5 F (37.5 C) 100.1 F (37.8 C) 99.1 F (37.3 C) 98.5 F (36.9 C)  TempSrc: Oral Oral Oral Oral  SpO2:  99% 96% 100%  Weight:      Height:       CBC Latest Ref Rng & Units 10/11/2019 10/10/2019 10/01/2019  WBC 4.0 - 10.5 K/uL 4.1 6.6 12.4(H)  Hemoglobin 13.0 - 17.0 g/dL 14.4 81.8 11.5(L)  Hematocrit 39.0 - 52.0 % 39.8 43.4 34.8(L)  Platelets 150 - 400 K/uL 245 328 258   CMP Latest Ref Rng & Units 10/11/2019 10/10/2019 09/30/2019  Glucose 70 - 99 mg/dL 563(J) 497(W) 263(Z)  BUN 6 - 20 mg/dL 5(L) 7 <8(H)  Creatinine 0.61 - 1.24 mg/dL 8.85 0.27 7.41  Sodium 135 - 145 mmol/L 137 136 139  Potassium 3.5 - 5.1 mmol/L 3.7 4.2 3.6  Chloride 98 - 111 mmol/L 102 99 99  CO2 22 - 32 mmol/L 25 25 27   Calcium 8.9 - 10.3 mg/dL ) 9.0 2.8(N)  Total Protein 6.5 - 8.1 g/dL 6.3(L) 7.5 -  Total Bilirubin 0.3 - 1.2 mg/dL 0.4 0.3 -  Alkaline Phos 38 - 126 U/L 102 128(H) -  AST 15 - 41 U/L 47(H) 53(H) -  ALT 0 - 44 U/L 57(H) 68(H) -   Physical Exam  Constitutional: He is oriented to person, place, and time and well-developed, well-nourished, and in no distress.  HENT:  Head: Normocephalic and atraumatic.  Cardiovascular: Normal rate, regular rhythm and intact distal pulses. Exam reveals no gallop and no friction rub.  No murmur heard. Pulmonary/Chest: Effort normal  and breath sounds normal. No respiratory distress. He has no wheezes. He has no rales.  Abdominal: Soft. Bowel sounds are normal. He exhibits no distension. There is no abdominal tenderness. There is no rebound.  Musculoskeletal:        General: No tenderness, deformity or edema. Normal range of motion.  Neurological: He is alert and oriented to person, place, and time.  Skin: Skin is warm and dry.  Right inguinal groin wound clean,dry with packing in place. No obvious signs of infection noted    Assessment/Plan:  Logan Weiss is a 49 yr old male with PMHx of poorly controlled type 2 diabetes presenting with fever, chills and increased bloody drainage from wound s/p I&D of perineal soft tissue abscess.  Fever: Unclear etiology.  Patient with recent hospitalization for perineal soft tissue abscess s/p I&D by surgery and was on broad spectrum antibiotics. However, continued to have fever at home despite cefdinir with increased bloody drainage from right groin wound.  CT scan w/o abscess or ongoing infection at site of prior wound/perineal abscess. BCx negative so far. Will f/u blood cultureThrombophlebitis can be a possibility. Could also be of viral etiology or from antibiotic induced fever.  -  ID consulted, appreciate their recommendations - F/u blood cultures - Continue IV antibiotics; will consider switching to PO antibiotics when 24 hour afebrile -Monitor CBC    Transaminitis: Noted to have mild transaminitis that is improving. No RUQ tenderness.  - Continue to monitor   Poorly controlled DM II:  HbA1c 13.3. Has been on Lantus 35U qHS + Novolog 12U tid w/meals. CBG 100-200.  - Continue Lantus 35U qHS - Continue Novolog 12U tid  - CBG monitoring   FEN/GI: Diet: Carb modified Electrolytes: monitor and replete prn Fluids: None  DVT Prophylaxis: Lovenox Code: FULL  Prior to Admission Living Arrangement: Home Anticipated Discharge Location: Home Barriers to Discharge: Ongoing  medical evaluation and treatment Dispo: Anticipated discharge in approximately 1-2 day(s).   Harvie Heck, MD  Internal Medicine, PGY-1 10/12/2019, 6:51 AM Pager: [phone]

## 2019-10-12 NOTE — Consult Note (Signed)
   [facility] Inpatient Consult   10/12/2019  Logan Weiss 1971/02/27 [id number]   Insurance plan: [employer]  Made aware of readmission by [facility] RN Care Coordinator Elmer Picker, who had received referral.  Patient is currently in a pending status with [facility] [[facility] Care Management for chronic disease management services.  Patient has been referred for diabetes follow up with Hgb A1C to start with community based plan of care has focused on disease management and community resource support.    Plan: Will follow up with Inpatient Transition Of Care [TOC] team member to make aware that [facility] Care Management following for progress and disposition needs..   Of note, [facility] Care Management services does not replace or interfere with any services that are needed or arranged by inpatient [facility] care management team.  For additional questions or referrals please contact:  [name], RN BSN CCM [facility]  [phone] business mobile phone Toll free office [phone]  Fax number: [phone] [name]

## 2019-10-13 DIAGNOSIS — Z8614 Personal history of Methicillin resistant Staphylococcus aureus infection: Secondary | ICD-10-CM

## 2019-10-13 LAB — GLUCOSE, CAPILLARY
Glucose-Capillary: 143 mg/dL — ABNORMAL HIGH (ref 70–99)
Glucose-Capillary: 162 mg/dL — ABNORMAL HIGH (ref 70–99)
Glucose-Capillary: 164 mg/dL — ABNORMAL HIGH (ref 70–99)
Glucose-Capillary: 162 mg/dL — ABNORMAL HIGH (ref 70–99)

## 2019-10-13 LAB — COMPREHENSIVE METABOLIC PANEL
ALT: 62 U/L — ABNORMAL HIGH (ref 0–44)
AST: 41 U/L (ref 15–41)
Albumin: 2.3 g/dL — ABNORMAL LOW (ref 3.5–5.0)
Alkaline Phosphatase: 117 U/L (ref 38–126)
Anion gap: 9 (ref 5–15)
BUN: <5 mg/dL — ABNORMAL LOW (ref 6–20)
CO2: 26 mmol/L (ref 22–32)
Calcium: 8.4 mg/dL — ABNORMAL LOW (ref 8.9–10.3)
Chloride: 104 mmol/L (ref 98–111)
Creatinine, Ser: 0.69 mg/dL (ref 0.61–1.24)
GFR calc Af Amer: >60 mL/min (ref >60)
GFR calc non Af Amer: >60 mL/min (ref >60)
Glucose, Bld: 146 mg/dL — ABNORMAL HIGH (ref 70–99)
Potassium: 3.5 mmol/L (ref 3.5–5.1)
Sodium: 139 mmol/L (ref 135–145)
Total Bilirubin: 0.5 mg/dL (ref 0.3–1.2)
Total Protein: 5.9 g/dL — ABNORMAL LOW (ref 6.5–8.1)

## 2019-10-13 LAB — CBC
HCT: 34.6 % — ABNORMAL LOW (ref 39.0–52.0)
Hemoglobin: 11.6 g/dL — ABNORMAL LOW (ref 13.0–17.0)
MCH: 30.7 pg (ref 26.0–34.0)
MCHC: 33.5 g/dL (ref 30.0–36.0)
MCV: 91.5 fL (ref 80.0–100.0)
Platelets: 226 10*3/uL (ref 150–400)
RBC: 3.78 MIL/uL — ABNORMAL LOW (ref 4.22–5.81)
RDW: 13.2 % (ref 11.5–15.5)
WBC: 4.9 10*3/uL (ref 4.0–10.5)
nRBC: 0 % (ref 0.0–0.2)

## 2019-10-13 LAB — LACTIC ACID, PLASMA: Lactic Acid, Venous: 1.3 mmol/L (ref 0.5–1.9)

## 2019-10-13 MED ORDER — SULFAMETHOXAZOLE-TRIMETHOPRIM 800-160 MG PO TABS
1.0000 | ORAL_TABLET | Freq: Two times a day (BID) | ORAL | Status: DC
  Administered 2019-10-13 – 2019-10-14 (×3): 1 via ORAL
  Filled 2019-10-13 (×4): qty 1

## 2019-10-13 MED ORDER — CEFDINIR 300 MG PO CAPS
300.0000 mg | ORAL_CAPSULE | Freq: Two times a day (BID) | ORAL | Status: DC
  Administered 2019-10-13 – 2019-10-14 (×3): 300 mg via ORAL
  Filled 2019-10-13 (×4): qty 1

## 2019-10-13 NOTE — Progress Notes (Signed)
[facility] Progress Note     Subjective: Patient reports wound has been looking good at home. Reports pain with trying to sit on the toilet. Tolerating diet and having bowel function. Fevers have resolved.   Objective: Vital signs in last 24 hours: Temp:  [97.9 F (36.6 C)-99.7 F (37.6 C)] 97.9 F (36.6 C) (04/06 0500) Pulse Rate:  [97-105] 100 (04/06 0500) Resp:  [16-18] 18 (04/06 0500) BP: (104-111)/(71-74) 108/74 (04/06 0500) SpO2:  [97 %-99 %] 99 % (04/06 0500) Last BM Date: 10/12/19  Intake/Output from previous day: 04/05 0701 - 04/06 0700 In: 560 [P.O.:360; IV Piggyback:200] Out: -  Intake/Output this shift: No intake/output data recorded.  PE: General: pleasant, WD, WN male who is laying in bed in NAD Heart: regular, rate, and rhythm.  Normal s1,s2. No obvious murmurs, gallops, or rubs noted.  Palpable radial and pedal pulses bilaterally Lungs: CTAB, no wheezes, rhonchi, or rales noted.  Respiratory effort nonlabored Abd: soft, NT, ND, +BS GU: R groin wound clean with beefy red granulation tissue and no purulence, no surrounding cellulitis    Lab Results:  Recent Labs    10/12/19 0719 10/13/19 0403  WBC 4.0 4.9  HGB 12.1* 11.6*  HCT 36.9* 34.6*  PLT 225 226   BMET Recent Labs    10/12/19 0719 10/13/19 0403  NA 137 139  K 4.3 3.5  CL 103 104  CO2 24 26  GLUCOSE 166* 146*  BUN <5* <5*  CREATININE 0.78 0.69  CALCIUM 8.2* 8.4*   PT/INR Recent Labs    10/10/19 1706  LABPROT 14.3  INR 1.1   CMP     Component Value Date/Time   NA 139 10/13/2019 0403   K 3.5 10/13/2019 0403   CL 104 10/13/2019 0403   CO2 26 10/13/2019 0403   GLUCOSE 146 (H) 10/13/2019 0403   BUN <5 (L) 10/13/2019 0403   CREATININE 0.69 10/13/2019 0403   CREATININE 0.78 06/05/2014 1155   CALCIUM 8.4 (L) 10/13/2019 0403   PROT 5.9 (L) 10/13/2019 0403   ALBUMIN 2.3 (L) 10/13/2019 0403   AST 41 10/13/2019 0403   ALT 62 (H) 10/13/2019 0403   ALKPHOS 117  10/13/2019 0403   BILITOT 0.5 10/13/2019 0403   GFRNONAA >60 10/13/2019 0403   GFRNONAA >89 06/05/2014 1155   GFRAA >60 10/13/2019 0403   GFRAA >89 06/05/2014 1155   Lipase     Component Value Date/Time   LIPASE 39 04/13/2018 0831       Studies/Results: No results found.  Anti-infectives: Anti-infectives (From admission, onward)   Start     Dose/Rate Route Frequency Ordered Stop   10/11/19 0700  vancomycin (VANCOCIN) IVPB 1000 mg/200 mL premix     1,000 mg 200 mL/hr over 60 Minutes Intravenous Every 12 hours 10/10/19 1858     10/10/19 2200  ceFEPIme (MAXIPIME) 2 g in sodium chloride 0.9 % 100 mL IVPB     2 g 200 mL/hr over 30 Minutes Intravenous Every 8 hours 10/10/19 1858     10/10/19 1745  ceFEPIme (MAXIPIME) 2 g in sodium chloride 0.9 % 100 mL IVPB     2 g 200 mL/hr over 30 Minutes Intravenous  Once 10/10/19 1730 10/10/19 2121   10/10/19 1745  metroNIDAZOLE (FLAGYL) IVPB 500 mg     500 mg 100 mL/hr over 60 Minutes Intravenous  Once 10/10/19 1730 10/11/19 0347   10/10/19 1745  vancomycin (VANCOCIN) IVPB 1000 mg/200 mL premix  Status:  Discontinued  1,000 mg 200 mL/hr over 60 Minutes Intravenous  Once 10/10/19 1730 10/10/19 1737   10/10/19 1745  vancomycin (VANCOREADY) IVPB 1500 mg/300 mL     1,500 mg 150 mL/hr over 120 Minutes Intravenous  Once 10/10/19 1737 10/11/19 0205       Assessment/Plan DM2  Fever - resolved, no leukocytosis   Perineal and right groin necrotizing soft tissue infection s/pirrigation and debridement of R perineum and thigh- Dr. Bobbye Morton - 3/21 s/pdebridement of skin, soft tissue rightgroin and perineummeasuring22x8x4 cm - Dr. Donne Hazel - 3/23 -wound is clean with healthy granulation tissue and no surrounding cellulitis  - continue current wound care - follow up as planned in [place] office next week - no other surgical recommendations    LOS: 3 days    Brigid Re , [facility] 10/13/2019, 10:11 AM Please  see Amion for pager number during day hours 7:00am-4:30pm

## 2019-10-13 NOTE — Progress Notes (Signed)
Patient ID: Logan Weiss, male   DOB: [birth year], 49 y.o.   MRN: [id number]         [facility]  Date of Admission:  10/10/2019    Total days of antibiotics 6        Day 4 vancomycin        Day 4 cefepime ASSESSMENT: I suspect that his fevers are due to mild IV associated thrombophlebitis.  He is improving.  I recommend switching to cefdinir and Trimethoprim/Sulfamethoxazole for 4 more days.  PLAN: 1. Change antibiotics to cefdinir and trimethoprim sulfamethoxazole 2. I will sign off now  Principal Problem:   Fever Active Problems:   Type 2 diabetes mellitus with hyperglycemia (HCC)   Abscess of multiple sites of perineum   Normocytic anemia   Necrotizing soft tissue infection   Sepsis (HCC)   Elevated liver enzymes   Scheduled Meds: . atorvastatin  20 mg Oral Daily  . enoxaparin (LOVENOX) injection  40 mg Subcutaneous Q24H  . insulin aspart  12 Units Subcutaneous TID WC  . insulin glargine  35 Units Subcutaneous QHS   Continuous Infusions:  PRN Meds:.acetaminophen, oxyCODONE   SUBJECTIVE: He is doing better other than having pain at his IV site.  Review of Systems: Review of Systems  Constitutional: Positive for malaise/fatigue. Negative for chills, diaphoresis, fever and weight loss.  HENT: Negative for congestion and sore throat.   Respiratory: Positive for cough. Negative for sputum production and shortness of breath.   Cardiovascular: Negative for chest pain.  Gastrointestinal: Negative for abdominal pain, diarrhea, nausea and vomiting.  Genitourinary: Negative for dysuria.  Musculoskeletal: Negative for back pain and joint pain.  Skin: Negative for itching and rash.  Neurological: Negative for headaches.    Allergies  Allergen Reactions  . Metformin And Related Diarrhea and Nausea And Vomiting  . Doxycycline Hives    OBJECTIVE: Vitals:   10/12/19 0940 10/12/19 1406 10/12/19 1955 10/13/19 0500  BP:  104/71 111/71 108/74   Pulse:  97 (!) 105 100  Resp:  18 16 18   Temp: 99.2 F (37.3 C) 99.5 F (37.5 C) 99.7 F (37.6 C) 97.9 F (36.6 C)  TempSrc: Oral Oral Oral Oral  SpO2:  97% 97% 99%  Weight:      Height:       Body mass index is 28.57 kg/m.  Physical Exam Constitutional:      Comments: He is resting quietly in bed.  His wife is visiting.  Cardiovascular:     Rate and Rhythm: Normal rate and regular rhythm.     Heart sounds: No murmur.  Pulmonary:     Effort: Pulmonary effort is normal.     Breath sounds: Normal breath sounds.  Abdominal:     Palpations: Abdomen is soft.     Tenderness: There is no abdominal tenderness.  Musculoskeletal:        General: No swelling or tenderness.  Skin:    Findings: No rash.     Comments: No active cellulitis or thrombophlebitis in his right arm.  Neurological:     General: No focal deficit present.  Psychiatric:        Mood and Affect: Mood normal.       Lab Results Lab Results  Component Value Date   WBC 4.9 10/13/2019   HGB 11.6 (L) 10/13/2019   HCT 34.6 (L) 10/13/2019   MCV 91.5 10/13/2019   PLT 226 10/13/2019    Lab Results  Component Value Date  CREATININE 0.69 10/13/2019   BUN <5 (L) 10/13/2019   NA 139 10/13/2019   K 3.5 10/13/2019   CL 104 10/13/2019   CO2 26 10/13/2019    Lab Results  Component Value Date   ALT 62 (H) 10/13/2019   AST 41 10/13/2019   ALKPHOS 117 10/13/2019   BILITOT 0.5 10/13/2019     Microbiology: Recent Results (from the past 240 hour(s))  Blood culture (routine x 2)     Status: None (Preliminary result)   Collection Time: 10/10/19  5:50 PM   Specimen: BLOOD  Result Value Ref Range Status   Specimen Description BLOOD RIGHT HAND  Final   Special Requests   Final    BOTTLES DRAWN AEROBIC AND ANAEROBIC Blood Culture results may not be optimal due to an inadequate volume of blood received in culture bottles   Culture   Final    NO GROWTH 3 DAYS Performed at [facility], [address] [place], [place] [address]    Report Status PENDING  Incomplete  Blood culture (routine x 2)     Status: None (Preliminary result)   Collection Time: 10/10/19  6:25 PM   Specimen: BLOOD  Result Value Ref Range Status   Specimen Description BLOOD LEFT FOREARM  Final   Special Requests   Final    BOTTLES DRAWN AEROBIC AND ANAEROBIC Blood Culture results may not be optimal due to an inadequate volume of blood received in culture bottles   Culture   Final    NO GROWTH 3 DAYS Performed at [facility], [address], [place], [place] [address]    Report Status PENDING  Incomplete  Urine culture     Status: Abnormal   Collection Time: 10/10/19  9:23 PM   Specimen: Urine, Random  Result Value Ref Range Status   Specimen Description URINE, RANDOM  Final   Special Requests NONE  Final   Culture (A)  Final    <10,000 COLONIES/mL INSIGNIFICANT GROWTH Performed at [facility], [address] [place], [place] [address]    Report Status 10/12/2019 FINAL  Final  SARS CORONAVIRUS 2 (TAT 6-24 HRS) Nasopharyngeal     Status: None   Collection Time: 10/10/19  9:23 PM   Specimen: Nasopharyngeal  Result Value Ref Range Status   SARS Coronavirus 2 NEGATIVE NEGATIVE Final    Comment: (NOTE) SARS-CoV-2 target nucleic acids are NOT DETECTED. The SARS-CoV-2 RNA is generally detectable in upper and lower respiratory specimens during the acute phase of infection. Negative results do not preclude SARS-CoV-2 infection, do not rule out co-infections with other pathogens, and should not be used as the sole basis for treatment or other patient management decisions. Negative results must be combined with clinical observations, patient history, and epidemiological information. The expected result is Negative. Fact Sheet for Patients: HairSlick.no Fact Sheet for Healthcare Providers: quierodirigir.com This test is not yet approved or cleared  by the [place] FDA and  has been authorized for detection and/or diagnosis of SARS-CoV-2 by FDA under an Emergency Use Authorization (EUA). This EUA will remain  in effect (meaning this test can be used) for the duration of the COVID-19 declaration under Section 56 4(b)(1) of the Act, 21 U.S.C. section 360bbb-3(b)(1), unless the authorization is terminated or revoked sooner. Performed at [facility], [address] [place], [place] [address]     Cliffton Asters, MD [facility] [phone] pager  [phone] cell 10/13/2019, 11:48 AM

## 2019-10-13 NOTE — Progress Notes (Addendum)
Subjective: HD#3 Overnight, no acute events reported.  This morning, patient evaluated at bedside. Surgery was in the room doing wound care teaching. Pt says he is doing well today. We discussed transitioning from IV abx to PO antibiotics. 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   Objective:  Vital signs in last 24 hours: Vitals:   10/12/19 0940 10/12/19 1406 10/12/19 1955 10/13/19 0500  BP:  104/71 111/71 108/74  Pulse:  97 (!) 105 100  Resp:  18 16 18   Temp: 99.2 F (37.3 C) 99.5 F (37.5 C) 99.7 F (37.6 C) 97.9 F (36.6 C)  TempSrc: Oral Oral Oral Oral  SpO2:  97% 97% 99%  Weight:      Height:       CBC Latest Ref Rng & Units 10/13/2019 10/12/2019 10/11/2019  WBC 4.0 - 10.5 K/uL 4.9 4.0 4.1  Hemoglobin 13.0 - 17.0 g/dL 11.6(L) 12.1(L) 13.0  Hematocrit 39.0 - 52.0 % 34.6(L) 36.9(L) 39.8  Platelets 150 - 400 K/uL 226 225 245   CMP Latest Ref Rng & Units 10/13/2019 10/12/2019 10/11/2019  Glucose 70 - 99 mg/dL 12/11/2019) 962(I) 297(L)  BUN 6 - 20 mg/dL 892(J) <1(H) 5(L)  Creatinine 0.61 - 1.24 mg/dL <4(R 7.40 8.14  Sodium 135 - 145 mmol/L 139 137 137  Potassium 3.5 - 5.1 mmol/L 3.5 4.3 3.7  Chloride 98 - 111 mmol/L 104 103 102  CO2 22 - 32 mmol/L 26 24 25   Calcium 8.9 - 10.3 mg/dL 4.81) ) 8.5(U)  Total Protein 6.5 - 8.1 g/dL 5.9(L) 6.0(L) 6.3(L)  Total Bilirubin 0.3 - 1.2 mg/dL 0.5 0.9 0.4  Alkaline Phos 38 - 126 U/L 117 123 102  AST 15 - 41 U/L 41 53(H) 47(H)  ALT 0 - 44 U/L 62(H) 66(H) 57(H)   Physical Exam  Constitutional: He is oriented to person, place, and time and well-developed, well-nourished, and in no distress.  HENT:  Head: Normocephalic and atraumatic.  Cardiovascular: Normal rate, regular rhythm and intact distal pulses. Exam reveals no gallop and no friction rub.  No murmur heard. Pulmonary/Chest: Effort normal and breath sounds normal. No respiratory distress. He has no wheezes. He  has no rales.  Abdominal: Soft. Bowel sounds are normal. He exhibits no distension. There is no abdominal tenderness. There is no rebound.  Musculoskeletal:        General: No tenderness, deformity or edema. Normal range of motion.  Neurological: He is alert and oriented to person, place, and time.  Skin: Skin is warm and dry.  Right inguinal groin wound clean,dry with packing in place. No obvious signs of infection noted    Assessment/Plan:  Mr. Xhaiden Coombs is a 49 yr old male with PMHx of poorly controlled type 2 diabetes presenting with fever, chills and increased bloody drainage from wound s/p I&D of perineal soft tissue abscess.  Surgical wound complicated by fever Patient with recent hospitalization for perineal soft tissue abscess s/p I&D on 3/21 and 3/23. However, continued to have fever at home despite cefdinir with increased bloody drainage from right groin wound.  Fever could be due to mild IV thrombophlebitis and unrelated to the surgical wound. CT scan w/o abscess or ongoing infection at site of prior wound/perineal abscess. BCx remain negative and fever curve has improved.  - ID consulted, appreciate their recommendations - Transition to PO antibiotics today, likely cefdinir and septra  - Will continue to  trend fever curve  - F/u blood cultures - F/u with PCP and CCS    Transaminitis: Noted to have mild transaminitis that is improving. No RUQ tenderness.  - Continue to monitor   Poorly controlled DM II:  HbA1c 13.3. Has been on Lantus 35U qHS + Novolog 12U tid w/meals. CBG 100-200.  - F/u with PCP for further diabetes management.  - CBG monitoring   FEN/GI: Diet: Carb modified Electrolytes: monitor and replete prn Fluids: None  DVT Prophylaxis: Lovenox Code: FULL  Prior to Admission Living Arrangement: Home Anticipated Discharge Location: Home Barriers to Discharge: Ongoing medical evaluation and management  Dispo: Anticipated discharge in approximately 0-1  day(s).   Harvie Heck, MD  Internal Medicine, PGY-1 10/13/2019, 6:13 AM Pager: [phone]

## 2019-10-14 LAB — CBC
HCT: 36.7 % — ABNORMAL LOW (ref 39.0–52.0)
Hemoglobin: 11.9 g/dL — ABNORMAL LOW (ref 13.0–17.0)
MCH: 30.4 pg (ref 26.0–34.0)
MCHC: 32.4 g/dL (ref 30.0–36.0)
MCV: 93.6 fL (ref 80.0–100.0)
Platelets: 245 10*3/uL (ref 150–400)
RBC: 3.92 MIL/uL — ABNORMAL LOW (ref 4.22–5.81)
RDW: 13.2 % (ref 11.5–15.5)
WBC: 6.9 10*3/uL (ref 4.0–10.5)
nRBC: 0 % (ref 0.0–0.2)

## 2019-10-14 LAB — COMPREHENSIVE METABOLIC PANEL
ALT: 56 U/L — ABNORMAL HIGH (ref 0–44)
AST: 36 U/L (ref 15–41)
Albumin: 2.6 g/dL — ABNORMAL LOW (ref 3.5–5.0)
Alkaline Phosphatase: 117 U/L (ref 38–126)
Anion gap: 13 (ref 5–15)
BUN: 6 mg/dL (ref 6–20)
CO2: 25 mmol/L (ref 22–32)
Calcium: 8.7 mg/dL — ABNORMAL LOW (ref 8.9–10.3)
Chloride: 101 mmol/L (ref 98–111)
Creatinine, Ser: 0.87 mg/dL (ref 0.61–1.24)
GFR calc Af Amer: >60 mL/min (ref >60)
GFR calc non Af Amer: >60 mL/min (ref >60)
Glucose, Bld: 212 mg/dL — ABNORMAL HIGH (ref 70–99)
Potassium: 3.5 mmol/L (ref 3.5–5.1)
Sodium: 139 mmol/L (ref 135–145)
Total Bilirubin: 0.3 mg/dL (ref 0.3–1.2)
Total Protein: 6.6 g/dL (ref 6.5–8.1)

## 2019-10-14 LAB — GLUCOSE, CAPILLARY
Glucose-Capillary: 142 mg/dL — ABNORMAL HIGH (ref 70–99)
Glucose-Capillary: 108 mg/dL — ABNORMAL HIGH (ref 70–99)

## 2019-10-14 MED ORDER — OXYCODONE HCL 5 MG PO TABS: 5.0000 mg | ORAL_TABLET | ORAL | 0 refills | Status: AC | PRN

## 2019-10-14 MED ORDER — SULFAMETHOXAZOLE-TRIMETHOPRIM 200-40 MG/5ML PO SUSP: 20.0000 mL | Freq: Two times a day (BID) | ORAL | 0 refills | Status: AC

## 2019-10-14 MED ORDER — CEFDINIR 300 MG PO CAPS: 300.0000 mg | ORAL_CAPSULE | Freq: Two times a day (BID) | ORAL | 0 refills | Status: AC

## 2019-10-14 NOTE — Progress Notes (Signed)
Patient discharged to home with instructions and some dressing supplies. 

## 2019-10-14 NOTE — Discharge Summary (Signed)
Name: Logan Weiss MRN: [id number] DOB: April 28, 1971 49 y.o. PCP: Malka So., MD  Date of Admission: 10/10/2019  4:45 PM Date of Discharge:  Attending Physician: No att. providers found  Discharge Diagnosis: 1. Surgical wound complicated by fever 2. Transaminitis 3. Poorly controlled type 2 DM  Discharge Medications: Allergies as of 10/14/2019      Reactions   Metformin And Related Diarrhea, Nausea And Vomiting   Doxycycline Hives      Medication List    STOP taking these medications   cefdinir 300 MG capsule Commonly known as: OMNICEF     TAKE these medications   atorvastatin 20 MG tablet Commonly known as: LIPITOR Take 20 mg by mouth daily.   Cholecalciferol 125 MCG (5000 UT) capsule Take 1 tablet by mouth daily.   cyclobenzaprine 10 MG tablet Commonly known as: FLEXERIL Take 1 tablet (10 mg total) by mouth 2 (two) times daily as needed for muscle spasms.   GlucoCom Lancets Misc Check blood sugar TID & QHS   glucose blood test strip Commonly known as: Choice DM Fora G20 Test Strips Use as instructed   insulin aspart 100 UNIT/ML FlexPen Commonly known as: NOVOLOG Inject 12 Units into the skin 3 (three) times daily with meals.   insulin glargine 100 UNIT/ML Solostar Pen Commonly known as: LANTUS Inject 35 Units into the skin at bedtime.   oxyCODONE 5 MG immediate release tablet Commonly known as: Oxy IR/ROXICODONE Take 1 tablet (5 mg total) by mouth every 4 (four) hours as needed for up to 7 days for moderate pain or severe pain (5mg  for moderate pain, 10mg  for severe pain).   pantoprazole 20 MG tablet Commonly known as: PROTONIX Take 1 tablet (20 mg total) by mouth daily.   polyethylene glycol 17 g packet Commonly known as: MIRALAX / GLYCOLAX Take 17 g by mouth daily. While taking opioid pain medication.   vitamin C 100 MG tablet Take 1 tablet by mouth daily.   vitamin E 200 UNIT capsule Take 200 Units by mouth daily.     ASK your doctor  about these medications   cefdinir 300 MG capsule Commonly known as: OMNICEF Take 1 capsule (300 mg total) by mouth every 12 (twelve) hours for 3 days. Ask about: Should I take this medication?   sulfamethoxazole-trimethoprim 200-40 MG/5ML suspension Commonly known as: BACTRIM Take 20 mLs by mouth 2 (two) times daily for 2 days. Continue antibiotics until 10/16/19 Ask about: Should I take this medication?       Disposition and follow-up:   Logan Weiss was discharged from [facility] in Stable condition.  At the hospital follow up visit please address:  1.  Surgical wound complicated by fever: Patient s/p I&D of perineal abscess on 3/21 & 3/23 presenting with fevers and increased bloody drainage despite antibiotics at home. CT scan w/o abscess or ongoing infection. Patient treated with IV antibiotics and discharged with cefdinir and bactrim for 3 days. Please make sure patient has completed the course of antibiotics and follows up with surgery.   Transaminitis: f/u CMP  Poorly controlled DM II: HbA1c 13; Lantus 35U qHS + Novolog 12U tid w/meals; will need improved glycemic control  2.  Labs / imaging needed at time of follow-up: CBC, CMP  3.  Pending labs/ test needing follow-up: None  Follow-up Appointments: PCP - 4/21 10/20/2019 CCS - within 3-4 weeks   Hospital Course by problem list: 1. Surgical wound complicated by fever Patient  with recent hospitalization for perineal soft tissue abscess s/p I&D on 3/21 and 3/23. However, continued to have fever at home despite cefdinir with increased bloody drainage from right groin wound. CT scan w/o abscess or ongoing infection at site of prior wound/perineal abscess. Infectious disease consulted. Patient treated with IV antibiotics and transitioned to PO antibiotics prior to discharge. BCx remain negative and fever curve has improved. Patient discharged home with cefdinir and bactrim for 3 days duration to complete  7 day course of antibiotics. He is to follow up with PCP and surgery.  2. Transaminitis Patient with mild transaminitis on labs. No RUQ tenderness on examination. Patient will likely need repeat LFTs.   3. Poorly controlled type 2 diabetes HbA1c 13.3. Patient is on Lantus 35U qHS with Novolog 12U tid with meals. CBG during his hospitalization has been 100-200 range. Patient will need good glycemic control (<200) for surgical wound healing. He would benefit from continued diabetes management.   Discharge Vitals:   BP 108/79 (BP Location: Right Arm)   Pulse 82   Temp 98.4 F (36.9 C) (Oral)   Resp 16   Ht 5\' 6"  (1.676 m)   Wt 80.3 kg   SpO2 98%   BMI 28.57 kg/m   Pertinent Labs, Studies, and Procedures:  CBC Latest Ref Rng & Units 10/14/2019 10/13/2019 10/12/2019  WBC 4.0 - 10.5 K/uL 6.9 4.9 4.0  Hemoglobin 13.0 - 17.0 g/dL 11.9(L) 11.6(L) 12.1(L)  Hematocrit 39.0 - 52.0 % 36.7(L) 34.6(L) 36.9(L)  Platelets 150 - 400 K/uL 245 226 225   CMP Latest Ref Rng & Units 10/14/2019 10/13/2019 10/12/2019  Glucose 70 - 99 mg/dL 212(H) 146(H) 166(H)  BUN 6 - 20 mg/dL 6 <5(L) <5(L)  Creatinine 0.61 - 1.24 mg/dL 0.87 0.69 0.78  Sodium 135 - 145 mmol/L 139 139 137  Potassium 3.5 - 5.1 mmol/L 3.5 3.5 4.3  Chloride 98 - 111 mmol/L 101 104 103  CO2 22 - 32 mmol/L 25 26 24   Calcium 8.9 - 10.3 mg/dL 8.7(L) 8.4(L) 8.2(L)  Total Protein 6.5 - 8.1 g/dL 6.6 5.9(L) 6.0(L)  Total Bilirubin 0.3 - 1.2 mg/dL 0.3 0.5 0.9  Alkaline Phos 38 - 126 U/L 117 117 123  AST 15 - 41 U/L 36 41 53(H)  ALT 0 - 44 U/L 56(H) 62(H) 66(H)   CXR 10/10/2019 IMPRESSION: No acute abnormality of the lungs.  CT ABDOMEN/PELVIS W CONTRAST 10/10/2019: IMPRESSION: Significant improvement in the degree of inflammatory change in the right inguinal region and extending into the medial aspect of the right buttock. No definitive recurrent abscess is seen.  Discharge Instructions: Discharge Instructions    Call MD for:  persistant nausea and  vomiting   Complete by: As directed    Call MD for:  temperature >100.4   Complete by: As directed    Diet - low sodium heart healthy   Complete by: As directed    Discharge instructions   Complete by: As directed    Logan Weiss, Logan Weiss were admitted with fevers and increased drainage from your right groin wound. You were treated with IV antibiotics and are being discharged with oral antibiotics. Please continue to take these as prescribed until 10/16/2019. Cefdinir 1 capsule every 12 hours  Bactrim 44mL every 12 hours   As we discussed, it is important to keep your blood sugars in check.  Please follow up with your PCP and surgery.   Thank you!   Increase activity slowly   Complete by: As  directed       Signed: Eliezer Bottom, MD 10/19/2019, 7:59 AM   Pager: [phone]

## 2019-10-14 NOTE — Progress Notes (Signed)
Subjective: HD#4 Overnight, no acute events reported.  This morning, patient evaluated at bedside.  He is doing well. We discussed the plan for DC today and continue oral antibiotic and follow up with surgery. Also advised about importance of blood sugar control and using Insulin. He verbalizes understandings and will f/u with PCP as well.  Objective:  Vital signs in last 24 hours: Vitals:   10/13/19 0500 10/13/19 1521 10/13/19 2046 10/14/19 0404  BP: 108/74 99/67 105/70 110/74  Pulse: 100 88 89 80  Resp: 18 18 18 18   Temp: 97.9 F (36.6 C) 98.1 F (36.7 C) 98.3 F (36.8 C) 97.9 F (36.6 C)  TempSrc: Oral Oral Oral Oral  SpO2: 99% 97% 96% 99%  Weight:      Height:       CBC Latest Ref Rng & Units 10/14/2019 10/13/2019 10/12/2019  WBC 4.0 - 10.5 K/uL 6.9 4.9 4.0  Hemoglobin 13.0 - 17.0 g/dL 11.9(L) 11.6(L) 12.1(L)  Hematocrit 39.0 - 52.0 % 36.7(L) 34.6(L) 36.9(L)  Platelets 150 - 400 K/uL 245 226 225   CMP Latest Ref Rng & Units 10/14/2019 10/13/2019 10/12/2019  Glucose 70 - 99 mg/dL 212(H) 146(H) 166(H)  BUN 6 - 20 mg/dL 6 <5(L) <5(L)  Creatinine 0.61 - 1.24 mg/dL 0.87 0.69 0.78  Sodium 135 - 145 mmol/L 139 139 137  Potassium 3.5 - 5.1 mmol/L 3.5 3.5 4.3  Chloride 98 - 111 mmol/L 101 104 103  CO2 22 - 32 mmol/L 25 26 24   Calcium 8.9 - 10.3 mg/dL 8.7(L) 8.4(L) 8.2(L)  Total Protein 6.5 - 8.1 g/dL 6.6 5.9(L) 6.0(L)  Total Bilirubin 0.3 - 1.2 mg/dL 0.3 0.5 0.9  Alkaline Phos 38 - 126 U/L 117 117 123  AST 15 - 41 U/L 36 41 53(H)  ALT 0 - 44 U/L 56(H) 62(H) 66(H)   Physical Exam  Constitutional: He is oriented to person, place, and time and well-developed, well-nourished, and in no distress.  HENT:  Head: Normocephalic and atraumatic.  Cardiovascular: Normal rate, regular rhythm and intact distal pulses. Exam reveals no gallop and no friction rub.  No murmur heard. Pulmonary/Chest: Effort normal and breath sounds normal. No respiratory distress. He has no wheezes. He has no  rales.  Abdominal: Soft. Bowel sounds are normal. He exhibits no distension. There is no abdominal tenderness. There is no rebound.  Musculoskeletal:        General: No tenderness, deformity or edema. Normal range of motion.  Neurological: He is alert and oriented to person, place, and time.  Skin: Skin is warm and dry.  Right inguinal groin wound clean,dry with packing in place. No obvious signs of infection noted    Assessment/Plan:  Mr. Logan Weiss is a 48 yr old male with PMHx of poorly controlled type 2 diabetes presenting with fever, chills and increased bloody drainage from wound s/p I&D of perineal soft tissue abscess.  Surgical wound complicated by fever Patient with recent hospitalization for perineal soft tissue abscess s/p I&D on 3/21 and 3/23. However, continued to have fever at home despite cefdinir with increased bloody drainage from right groin wound. CT scan w/o abscess or ongoing infection at site of prior wound/perineal abscess. Patient treated with IV antibiotics and transitioned to PO antibiotics yesterday. BCx remain negative and fever curve has improved.  - ID consulted, appreciate their recommendations - Patient to discharge home with cefdinir and bactrim for 3 days  - Oxycodone IR 5mg  q4h prn  - F/u with PCP and [facility]  Transaminitis: Noted to have mild transaminitis that is improving. No RUQ tenderness.  - F/u with PCP    Poorly controlled DM II:  HbA1c 13.3. Has been on Lantus 35U qHS + Novolog 12U tid w/meals. CBG 100-200.  - Continue Lantus 35U qHS + Novolog 12U tid w/meals  - F/u with PCP for further diabetes management.  - CBG monitoring   FEN/GI: Diet: Carb modified Electrolytes: monitor and replete prn Fluids: None  DVT Prophylaxis: Lovenox Code: FULL  Prior to Admission Living Arrangement: Home Anticipated Discharge Location: Home Barriers to Discharge: None  Dispo: Anticipated discharge in approximately 0-1 day(s).   Eliezer Bottom, MD   Internal Medicine, PGY-1 10/14/2019, 6:58 AM Pager: [phone]

## 2019-10-14 NOTE — Care Management Important Message (Signed)
Important Message  Patient Details  Name: Logan Weiss MRN: [id number] Date of Birth: 17-Oct-1970   Medicare Important Message Given:  Yes   Patient left prior to IM delivery.  IM mailed to patients home address.   Lakitha Gordy 10/14/2019, 4:00 PM

## 2019-10-14 NOTE — Plan of Care (Signed)
°  Problem: Clinical Measurements: °Goal: Ability to maintain clinical measurements within normal limits will improve °Outcome: Progressing °  °Problem: Activity: °Goal: Risk for activity intolerance will decrease °Outcome: Progressing °  °Problem: Nutrition: °Goal: Adequate nutrition will be maintained °Outcome: Progressing °  °

## 2019-10-14 NOTE — Discharge Instructions (Signed)
Mr. Jakyren, Fluegge were admitted with fevers and worsening drainage from your right groin wound

## 2019-10-15 ENCOUNTER — Other Ambulatory Visit: Payer: Self-pay | Admitting: *Deleted

## 2019-10-15 LAB — CULTURE, BLOOD (ROUTINE X 2)
Culture: NO GROWTH
Culture: NO GROWTH

## 2019-10-15 NOTE — Patient Outreach (Signed)
[facility]) Care Management  10/15/2019  Logan Weiss Mar 24, 1971 [id number]   Subjective: Telephone call to patient's home  / mobile number, no answer, left HIPAA compliant voicemail message, and requested call back.    Objective: Per KPN (Knowledge Performance Now, point of care tool) and chart review, patient hospitalized 10/10/2019 - 10/14/2019 for fevers.   Patient hospitalized 09/27/2019 - 10/04/2019 for Perineal soft tissue infection with abscess, DKA, status post debridement of skin, soft tissue right groin and perineum measuring22x8x4 cm on 3/232021.   Patient also has a history of diabetes, cellulitis, and MRSA infection.       Assessment: Received [facility] Liaison referral on 10/05/2019.  Referral source: [name].     Referral reason: transition of care for diabetes management.  Transition of care follow up pending patient contact.      Plan: RNCM will send unsuccessful outreach letter, [facility] pamphlet, will call patient for 2nd telephone outreach attempt within 4 business days, transition of care follow up, and proceed with case closure, within 10 business days if no return call.      Audra Kagel H. Gardiner Barefoot, BSN, CCM [facility] Care Management [facility] Telephonic CM Phone: [phone] Fax: [phone]

## 2019-10-19 ENCOUNTER — Encounter: Payer: Self-pay | Admitting: *Deleted

## 2019-10-19 ENCOUNTER — Other Ambulatory Visit: Payer: Self-pay | Admitting: *Deleted

## 2019-10-19 NOTE — Patient Outreach (Addendum)
[facility]) Care Management  10/19/2019  Logan Weiss 1970/08/12 [id number]   Subjective: Received voicemail message from [facility], states he is returning call, and requested call back. Telephone call to patient's home / mobile number, spoke with patient, and HIPAA verified.  Discussed [facility] Care Management [facility] Transition of care follow up, patient voiced understanding, and is in agreement to follow up.  States he is doing well, currently in the car, on his way to his surgeon follow up appointment, and has a follow up appointment with primary MD on 10/20/2019.  Patient states he is able to manage self care and has assistance as needed.  Patient voices understanding of medical diagnosis and treatment plan. Patient states he is aware of signs/ symptoms to report, was advised on how to reach provider if needed after hours, when to go to ED, and / or call 911. States he is accessing his  [facility] benefits as needed via member services number on back of card.  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   Advised of [facility], patient voices understanding,and  states he will follow up with primary MD to access the system if needed after documents are notarized.  Patient states he needs assistance with insulin,  just picked up medications when discharged from hospital, cost of these medications are causing a financial hardship,  and in agreement to a referral to [facility] Pharm[facility] Team for insulin patient assistance program identification.    Patient states he does not have any education material, transportation, or community resource, or needs at this time.  Patient is agreement to transition of care / care coordination follow up for diabetes and will transition to  diabetes disease management once transition of care / care coordination completed.  States he is very appreciative of the follow up, is in agreement to receive [facility] Care Management information, and services.     Objective: Per KPN (Knowledge Performance Now, point of care tool) and chart review, patient hospitalized 10/10/2019 - 10/14/2019 for fevers.   Patient hospitalized 09/27/2019 - 10/04/2019 for Perineal soft tissue infection with abscess, DKA, status post debridement of skin, soft tissue rightgroin and perineummeasuring22x8x4 cm on 3/232021.   Patient also has a history of diabetes, cellulitis, and MRSA infection.       Assessment: Received [facility] Liaison referral on 10/05/2019. Referral source: [name].     Referral reason: transition of care for diabetes management. Transition of care follow up completed and will follow up weekly for the next 4 weeks.  Will refer patient to [facility] Pharmacy Team for insulin patient assistance program identification.      Plan: RNCM will send patient welcome outreach letter, welcome packet, consent, [facility] pamphlet, and magnet. RNCM will refer patient to [facility] Pharmacy Team for insulin patient assistance program identification.  RNCM will call patient for telephone outreach attempt, within 7 business days, transition of care weekly follow up.     Strother Everitt H. Gardiner Barefoot, BSN, CCM [facility] Care Management [facility] Telephonic CM Phone: [phone] Fax: [phone]

## 2019-10-21 ENCOUNTER — Ambulatory Visit: Payer: Self-pay | Admitting: Pharmacist

## 2019-10-21 ENCOUNTER — Ambulatory Visit: Payer: 59 | Admitting: *Deleted

## 2019-10-22 ENCOUNTER — Ambulatory Visit: Payer: 59 | Admitting: Pharmacist

## 2019-10-22 ENCOUNTER — Telehealth: Payer: Self-pay | Admitting: Dietician

## 2019-10-22 DIAGNOSIS — E11620 Type 2 diabetes mellitus with diabetic dermatitis: Secondary | ICD-10-CM

## 2019-10-22 NOTE — Telephone Encounter (Signed)
-----   Message from Wynonia Hazard, [facility] sent at 10/22/2019 11:08 AM EDT ----- Roque Lias, and Glean Salen you are both doing well! Spoke with Mr. Nuon yesterday, reviewed insulins, hypoglycemia treatment, administration, ect.  Assisted him with obtaining co-pay cards for Lantus and Novolog.  He also mentioned wanting more education on diabetes and nutrition if either of you can assist with this.   Thanks so much! Mariane Masters, PharmD, [facility] Clinical Pharmacist [facility] [employer] [phone]

## 2019-10-22 NOTE — Telephone Encounter (Signed)
Tried calling Mr. Kathol. Left a message for return call.

## 2019-10-23 MED ORDER — INSULIN PEN NEEDLE 32G X 4 MM MISC: 11 refills | Status: AC

## 2019-10-23 NOTE — Telephone Encounter (Signed)
Asked to follow up with patient by [facility] pharmacist regarding their request for further diabetes information. I recommend a referral to a community diabetes educator such as at [facility] for ongoing support. Logan Weiss and [name] asked about Logan. Sweet starting to do his own injections since she is going back to work and expressed his trigger finger makes it difficult for him. We discussed injections sites, proper injection technique with the 53mm long pen needles they have and how 61mm long pen needles would make it easier(no pinch up required). Will request prescription for shorter pen needles.  They asked about his sugar dropping down to 48 which started in the past few days after he saw his PCP.  100 in morning, 49 before lunch on  4/14 58 at 522 pm- no symptoms on 4/15 85 at 6 pm, before bed 108, 180, 110 Recommended they call their PCP about a possible decrease in insulin.   Directed them  to ADA website and will mail info to support them with meal planning.  Recommended they call PCP or surgeon about pain issues.

## 2019-10-23 NOTE — Telephone Encounter (Signed)
[name], thank you so much. New needles have been sent. I agree that he should reach out to his PCP. He could also decrease his insulin by 3 units before he meets with him to be safe.

## 2019-10-26 ENCOUNTER — Other Ambulatory Visit: Payer: Self-pay | Admitting: *Deleted

## 2019-10-26 ENCOUNTER — Encounter: Payer: Self-pay | Admitting: Internal Medicine

## 2019-10-26 ENCOUNTER — Other Ambulatory Visit: Payer: Self-pay

## 2019-10-26 ENCOUNTER — Ambulatory Visit (INDEPENDENT_AMBULATORY_CARE_PROVIDER_SITE_OTHER): Payer: 59 | Admitting: Internal Medicine

## 2019-10-26 VITALS — BP 118/81 | HR 103 | Temp 97.5°F | Ht 66.0 in | Wt 182.2 lb

## 2019-10-26 DIAGNOSIS — E1169 Type 2 diabetes mellitus with other specified complication: Secondary | ICD-10-CM | POA: Diagnosis not present

## 2019-10-26 DIAGNOSIS — S31109A Unspecified open wound of abdominal wall, unspecified quadrant without penetration into peritoneal cavity, initial encounter: Secondary | ICD-10-CM | POA: Diagnosis not present

## 2019-10-26 NOTE — Progress Notes (Addendum)
Subjective:     Patient ID: Logan Weiss, male    DOB: [birth year], 49 y.o.   MRN: [id number]  Chief Complaint  Patient presents with  . Advice Only    for open wound from I & D    HPI: The patient is a 49 y.o. male with history of uncontrolled, insulin-dependent type 2 diabetes here for right groin wound.  He was referred by Dr. Donne Hazel.  He reports a history of groin infection that started at the end of march 2021 and has had 2 surgical debridements.  He also reports being hospitalized in April for fever likely from the wound that improved with IV antibiotics.  Since discharge 2 weeks ago he has followed up with surgery and his PCP.  He states that his wife currently does wet to dry dressings and overall feels fine.  He has mild pain with dressing changes.  He is currently using insulin to help control his diabetes.  Prior to march admission he was only on oral agents.  He states he is working with his PCP to adjust his regimen and has seen better control with his blood sugars.  He currently denies fever/chills, purulent drainage or increased warmth to the area.      Review of Systems  All other systems reviewed and are negative.    has a past medical history of Cellulitis (11/2017), Diabetes ([place]), and History of MRSA infection.  has a past surgical history that includes Appendectomy; right knee surgery; Knee surgery; Incision and drainage perirectal abscess (Right, 09/27/2019); and Irrigation and debridement abscess (Right, 09/29/2019).  reports that he quit smoking about 5 weeks ago. His smoking use included cigarettes. He smoked 0.00 packs per day for 5.00 years. He has never used smokeless tobacco. Objective:   Vital Signs BP 118/81 (BP Location: Left Arm, Patient Position: Sitting, Cuff Size: Normal)   Pulse (!) 103   Temp (!) 97.5 F (36.4 C) (Temporal)   Ht 5\' 6"  (1.676 m)   Wt 182 lb 3.2 oz (82.6 kg)   SpO2 98%   BMI 29.41 kg/m  Vital Signs and Nursing Note  Reviewed Physical Exam  Skin:  Right groin wound measures:  11.0 x 4.0 x 1.5cm Tunneling to the medial border Granulation tissue throughout        Assessment/Plan:     ICD-10-CM   1. Right groin wound, initial encounter  S31.109A   2. Type 2 diabetes mellitus with other specified complication, without long-term current use of insulin (HCC)  E11.69     Assessment: Perineal and right groin open wound secondary to necrotizing soft tissue infection s/p surgical debridement 3/23.  Patient was admitted at the end of March with a perineal and right groin necrotizing infection and had 2 surgical debridements during his stay.  At that time the post debridement wound measured 22 x 8 x 4 cm.  He was subsequently readmitted into the hospital for fever due to the surgical wound.  He improved with IV antibiotics.  It has been almost 2 weeks since his discharge.  He has followed up with Dr. Donne Hazel and his office notes were reviewed.    Currently the wound looks well-appearing with no signs of infection.  It has decreased in overall appearance. There is tunneling noted to the medial border and I was unable to measure the depth of this due to patient discomfort.  Patient has done well with twice daily wet-to-dry dressings.  I recommended continuing these and having close  follow up.  Will also discuss case with Dr. Ulice Bold, plastic surgeon.    Last hemoglobin A1C was 13 and this was prior to the patient starting insulin.  I encouraged he continue his insulin regimen and work with his PCP to achieve better glucose control as this will overall help with healing and minimize future infections.  He expressed understanding.  I spent 45 minutes face to face with the patient. Greater than 50% of the time was spent on counseling and coordination of care, which is detailed in the A&P.    Plan -wet to dry dressings twice daily - In office dressing change -follow up in 2 weeks.   Addendum:  Discussed  case with Dr. Marga Hoots with patient to see sooner than two weeks to possibly apply donated ACELL.  Patient agreeable to plan.  He also states he is having pain at the wound care site after assessment yesterday.  Overall appearance and drainage the same per patient.  Will send in short course of tramadol as he has had this previously. [place] controlled database reviewed.    Aldean Baker, DO 10/26/2019, 3:06 PM

## 2019-10-26 NOTE — Patient Outreach (Signed)
[facility]) Care Management  10/26/2019  Logan Weiss 11-18-1970 [id number]   Subjective:  Telephone call to patient's home  / mobile number, no answer, left HIPAA compliant voicemail message, and requested call back.    Objective:Per KPN (Knowledge Performance Now, point of care tool) and chart review,patient hospitalized 10/10/2019 - 10/14/2019 for fevers. Patient hospitalized 09/27/2019 - 10/04/2019 for Perineal soft tissue infection with abscess, DKA, status postdebridement of skin, soft tissue rightgroin and perineummeasuring22x8x4 cmon 3/232021. Patient also has a history of diabetes, cellulitis, and MRSA infection.      Assessment: Received [facility] Liaison referral on 10/05/2019. Referral source: [name]. Referral reason: transition of care for diabetes management. Transition of care follow up completed and will follow up weekly for the next 4 weeks.  Will refer patient to [facility] Pharmacy Team for insulin patient assistance program identification.      Plan:RNCM has sent patient welcome outreach letter, welcome packet, consent, [facility] pamphlet, and magnet. RNCM has  referred patient to [facility] Pharmacy Team for insulin patient assistance program identification.  RNCM will call patient for telephone outreach attempt, within 7 business days, transition of care weekly follow up.     Terrilyn Tyner H. Gardiner Barefoot, BSN, CCM [facility] Care Management [facility] Telephonic CM Phone: [phone] Fax: [phone]

## 2019-10-27 LAB — FUNGAL ORGANISM REFLEX

## 2019-10-27 LAB — FUNGUS CULTURE RESULT

## 2019-10-27 LAB — FUNGUS CULTURE WITH STAIN

## 2019-10-27 MED ORDER — TRAMADOL HCL 50 MG PO TABS: 50.0000 mg | ORAL_TABLET | Freq: Four times a day (QID) | ORAL | 0 refills | Status: AC | PRN

## 2019-10-27 NOTE — Addendum Note (Signed)
Addended by: Geralyn Corwin R on: 10/27/2019 01:32 PM   Modules accepted: Orders

## 2019-10-28 ENCOUNTER — Telehealth: Payer: Self-pay

## 2019-10-28 NOTE — Telephone Encounter (Signed)
Called patient to reschedule follow up appointment to 11/02/2019. During the call, he stated his wound had been bleeding more than normal. After speaking with Dr. Mikey Bussing, I called the patient and offered an appointment for today or tomorrow. The patient accepted and an appointment is scheduled for 10/29/2019 at 2:20PM.

## 2019-10-29 ENCOUNTER — Encounter: Payer: Self-pay | Admitting: Internal Medicine

## 2019-10-29 ENCOUNTER — Other Ambulatory Visit: Payer: Self-pay

## 2019-10-29 ENCOUNTER — Ambulatory Visit (INDEPENDENT_AMBULATORY_CARE_PROVIDER_SITE_OTHER): Payer: 59 | Admitting: Internal Medicine

## 2019-10-29 ENCOUNTER — Other Ambulatory Visit: Payer: Self-pay | Admitting: *Deleted

## 2019-10-29 VITALS — BP 129/86 | HR 107 | Temp 98.2°F | Ht 66.0 in | Wt 182.0 lb

## 2019-10-29 DIAGNOSIS — S31109A Unspecified open wound of abdominal wall, unspecified quadrant without penetration into peritoneal cavity, initial encounter: Secondary | ICD-10-CM

## 2019-10-29 NOTE — Patient Outreach (Signed)
[facility]) Care Management  10/29/2019  Logan Weiss 03/26/1971 [id number]   Subjective: Telephone call to patient's home / mobile number, spoke with patient, and HIPAA verified.  Patient states he remembers speaking with this RNCM in the past. States he is doing fairly well, had a wound care specialist appointment today, appointment went well, was told by provider, wound is very deep, wound examination by specialist increased wound bleeding, was advised by specialist that bleeding wound decrease over time, and wound could take up to 2 months to heal.  Patient voices understanding of treatment plan  is in agreement, would like it to heal sooner, is disappointment by timeframe, and understands that it will take time.  States he is committed to his healing, recovering, and understands the importance of improving his diabetes management.  States surgeon referred him to  wound care specialist, no other follow up needed with surgeon at this time, and wound care specialist will continue to see him until the wound is healed.  Patient states his next appointment with wound care specialist is on 11/02/2019.   States [facility] Pharmacist was able to connect him to a patient assistance program for insulin and he is very Adult nurse.  States he is disappointment with his insurance company coverage at times due to different things not being covered, had to recently change his meter because the test strips were not covered.   Patient states he does not have any transportation or community resource needs at this time. States he is very appreciative of the follow up and is in agreement to continue to receive [facility] Care Management services.  States he will reach out to this RNCM if needed prior to next patient outreach.    Objective:Per KPN (Knowledge Performance Now, point of care tool) and chart review,patient hospitalized 10/10/2019 - 10/14/2019 for fevers. Patient hospitalized 09/27/2019 - 10/04/2019 for  Perineal soft tissue infection with abscess, DKA, status postdebridement of skin, soft tissue rightgroin and perineummeasuring22x8x4 cmon 3/232021. Patient also has a history of diabetes, cellulitis, and MRSA infection.    Assessment: Received [facility] Liaison referral on 10/05/2019. Referral source: [name]. Referral reason: transition of care for diabetes management.Transition of care follow up completedand will follow up weekly for the next 4 weeks. Patient has been referred to  [facility] Pharmacy Team for insulin patient assistance program identification.  Fall Risk  10/29/2019  Falls in the past year? 0  Number falls in past yr: 0  Injury with Fall? 0  Risk for fall due to : Other (Comment)  Risk for fall due to: Comment Dizzy episode no fall  Follow up Education provided   Depression screen [facility] 2/9 10/29/2019  Decreased Interest 0  Down, Depressed, Hopeless 0  PHQ - 2 Score 0       Plan:RNCM will call patient for telephone outreach attempt, within 7 business days, transition of care weekly follow up.     Davison Ohms H. Gardiner Barefoot, BSN, CCM [facility] Care Management [facility] Telephonic CM Phone: [phone] Fax: [phone]

## 2019-10-29 NOTE — Progress Notes (Signed)
   Subjective:     Patient ID: Logan Weiss, male    DOB: 1970-09-03, 49 y.o.   MRN: [id number]  Chief Complaint  Patient presents with  . Follow-up    for (R) groin wound bleeding more than normal    HPI: The patient is a 49 y.o. male here for follow-up of right groin wound.   He was seen in our clinic 3 days ago and wanted to be re-evaluated because there was more bleeding at the wound site than usual with dressings changes.  He is unable to tell me the amount of bleeding but they did not saturate the dressings.  He denies acute pain, purulent drainage, or increased warmth to the area  Review of Systems  All other systems reviewed and are negative.    has a past medical history of Cellulitis (11/2017), Diabetes (HCC), and History of MRSA infection.  has a past surgical history that includes Appendectomy; right knee surgery; Knee surgery; Incision and drainage perirectal abscess (Right, 09/27/2019); and Irrigation and debridement abscess (Right, 09/29/2019).  reports that he quit smoking about 5 weeks ago. His smoking use included cigarettes. He smoked 0.00 packs per day for 5.00 years. He has never used smokeless tobacco. Objective:   Vital Signs BP 129/86 (BP Location: Left Arm, Patient Position: Sitting, Cuff Size: Large)   Pulse (!) 107   Temp 98.2 F (36.8 C) (Temporal)   Ht 5\' 6"  (1.676 m)   Wt 182 lb (82.6 kg)   SpO2 100%   BMI 29.38 kg/m  Vital Signs and Nursing Note Reviewed Physical Exam  Constitutional: He is well-developed, well-nourished, and in no distress.  Skin:  Right groin wound measures:  11.0 x 4.0 x 1.5cm Granulation tissue throughout   Scant blood noted in the wound    Assessment/Plan:     ICD-10-CM   1. 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 No signs of active bleeding.  I discussed the  option of adding donated acell to the wound and patient would like to try this option.  This was applied in office.  Patient will follow up on Monday.    Plan -donated acell applied with adaptic, lubricant and gauze - follow up on 4/26    5/26, DO 10/29/2019, 9:22 PM

## 2019-10-29 NOTE — Telephone Encounter (Signed)
This encounter was created in error - please disregard.

## 2019-10-29 NOTE — Patient Instructions (Signed)
Mr. Vanhoesen   It was a pleasure seeing you today.  Please change the guaze every morning and night. Place lubricant on the wound when you change the dressing  There is a mesh in place please make sure it is there everytime you change the dressing  Do not get the wound wet until I see you again

## 2019-10-30 ENCOUNTER — Telehealth: Payer: Self-pay

## 2019-10-30 NOTE — Telephone Encounter (Signed)
-----   Message from Camelia Phenes, [place] sent at 10/29/2019  8:46 PM EDT ----- Regarding: RE: notes I do not want the note from today sent.  The note is from 4/19.  That one is complete. Thanks! ----- Message ----- From: Larey Dresser, CMA Sent: 10/29/2019   4:57 PM EDT To: Camelia Phenes, DO Subject: notes                                          Dr. Mikey Bussing, I did not fax this patients notes to Dr. Dwain Sarna. They were not complete. Please finish and sign.  Thank you.

## 2019-10-30 NOTE — Telephone Encounter (Signed)
Faxed office notes from 10/26/19

## 2019-11-02 ENCOUNTER — Encounter: Payer: Self-pay | Admitting: Internal Medicine

## 2019-11-02 ENCOUNTER — Telehealth: Payer: Self-pay | Admitting: *Deleted

## 2019-11-02 ENCOUNTER — Other Ambulatory Visit: Payer: Self-pay

## 2019-11-02 ENCOUNTER — Ambulatory Visit (INDEPENDENT_AMBULATORY_CARE_PROVIDER_SITE_OTHER): Payer: 59 | Admitting: Internal Medicine

## 2019-11-02 VITALS — BP 114/80 | HR 94 | Temp 97.3°F | Ht 66.0 in | Wt 182.0 lb

## 2019-11-02 DIAGNOSIS — S31109A Unspecified open wound of abdominal wall, unspecified quadrant without penetration into peritoneal cavity, initial encounter: Secondary | ICD-10-CM | POA: Diagnosis not present

## 2019-11-02 NOTE — Telephone Encounter (Addendum)
Faxed order to [facility] for supplies to the patient.   Confirmation received, and copy scanned into the chart.//AB/CMA

## 2019-11-02 NOTE — Patient Instructions (Addendum)
Mr. Lafauci,  It was a pleasure seeing you.  Please place KY jelly on the wound 2 times a day.  There is a mesh in place, please locate it every dressing change.  On Friday start placing collagen dressings once daily and remove the mesh forever.  Please follow up next week

## 2019-11-02 NOTE — Progress Notes (Signed)
   Subjective:     Patient ID: Logan Weiss, male    DOB: Dec 07, 1970, 49 y.o.   MRN: [id number]  Chief Complaint  Patient presents with  . Follow-up    groin wound of the groin     HPI: The patient is a 49 y.o. male here for follow-up of right groin wound.  Patient has been doing twice daily dressing changes with gauze.  He denies acute pain, purulent drainage or increased warmth to the area.  No complaints today.  Review of Systems  All other systems reviewed and are negative.    has a past medical history of Cellulitis (11/2017), Diabetes (HCC), and History of MRSA infection.  has a past surgical history that includes Appendectomy; right knee surgery; Knee surgery; Incision and drainage perirectal abscess (Right, 09/27/2019); and Irrigation and debridement abscess (Right, 09/29/2019).  reports that he quit smoking about 6 weeks ago. His smoking use included cigarettes. He smoked 0.00 packs per day for 5.00 years. He has never used smokeless tobacco. Objective:   Vital Signs BP 114/80 (BP Location: Right Arm, Patient Position: Sitting, Cuff Size: Large)   Pulse 94   Temp (!) 97.3 F (36.3 C) (Temporal)   Ht 5\' 6"  (1.676 m)   Wt 182 lb (82.6 kg)   SpO2 100%   BMI 29.38 kg/m  Vital Signs and Nursing Note Reviewed Physical Exam  Constitutional: He is well-developed, well-nourished, and in no distress.  Skin:  Constitutional: He is well-developed, well-nourished, and in no distress.  Skin:  Right groin wound measures:  11.0 x 4.0 x 1.5cm Granulation tissue throughout   Scant blood noted in the wound     Assessment/Plan:     ICD-10-CM   1. 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  Today the wound shows improvement with no signs of infection.  Granulation tissue throughout.  Some sloughing was noted and removed with saline and gauze. Donated Acell was  applied again in the office.  Patient had not been placing lubricant on the wound since last clinic visit.  I asked he do that for the next 4 days.  On Friday he can switch to collagen dressings.  Plan -donated acell applied with adaptic, lubricant and gauze -lubricant 2x daily -stop lubricant on 4/30 and start collagen dressings -Supplies to be sent to patient through [facility]  5/30, DO 11/02/2019, 2:22 PM

## 2019-11-04 ENCOUNTER — Telehealth: Payer: Self-pay | Admitting: Internal Medicine

## 2019-11-04 NOTE — Telephone Encounter (Signed)
Patient called in to find out what restrictions he has. Can he walk up stairs? Can he drive? He just wants to know what he can and cannot do. Also, patient said he was told to keep the mesh in until Friday but it's really bloody when he changes the dressing and wanted to know if he should clean it before replacing it or just leave it in. Please call him to advise.

## 2019-11-04 NOTE — Telephone Encounter (Signed)
Called and [facility] @ 2:51pm) asking the patient to RTC regarding the message below.//AB/CMA

## 2019-11-05 ENCOUNTER — Encounter: Payer: Self-pay | Admitting: *Deleted

## 2019-11-05 ENCOUNTER — Other Ambulatory Visit: Payer: Self-pay | Admitting: *Deleted

## 2019-11-05 NOTE — Patient Outreach (Signed)
[facility]) Care Management  11/05/2019  Logan Weiss 10-24-1970 [id number]   Subjective: Telephone call to patient's home  / mobile number, no answer, left HIPAA compliant voicemail message, and requested call back.   Objective:Per KPN (Knowledge Performance Now, point of care tool) and chart review,patient hospitalized 10/10/2019 - 10/14/2019 for fevers. Patient hospitalized 09/27/2019 - 10/04/2019 for Perineal soft tissue infection with abscess, DKA, status postdebridement of skin, soft tissue rightgroin and perineummeasuring22x8x4 cmon 3/232021. Patient also has a history of diabetes, cellulitis, and MRSA infection.    Assessment: Received [facility] Liaison referral on 10/05/2019. Referral source: [name]. Referral reason: transition of care for diabetes management.Transition of care follow up completedand will follow up weekly for the next 4 weeks. Patient has been referred to  [facility] Pharmacy Team for insulin patient assistance program identification.    Plan:RNCM will call patient for 2nd telephone outreach attempt, within 7 business days, transition of care weekly follow up.     Darrell Leonhardt H. Gardiner Barefoot, BSN, CCM [facility] Care Management [facility] Telephonic CM Phone: [phone] Fax: [phone]

## 2019-11-05 NOTE — Telephone Encounter (Signed)
Spoke with the patient regarding the message below.  He asked about him driving, and I informed him if he feels he can drive and can slam on brake without thinking he will be in pain if he does then he can drive.  Patient stated that is a good thing to think about.  He also wanted to know when he starts to use the collagen does he need to place the collagen inside the wound like the adaptic was placed.  Informed him that I spoke with [facility] and she stated to do skin to skin for now, and he can verify with Dr. Mikey Bussing when he sees her on Monday.  Patient verbalized understanding and agreed.//AB/CMA

## 2019-11-06 ENCOUNTER — Other Ambulatory Visit: Payer: Self-pay | Admitting: *Deleted

## 2019-11-06 NOTE — Telephone Encounter (Signed)
Opened in error

## 2019-11-06 NOTE — Patient Outreach (Addendum)
[facility]) Care Management  11/06/2019  Logan Weiss 22-Oct-1970 [id number]   Subjective: Received voicemail message from [facility], states he is returning call, and requested call back.  Telephone call to patient's home  / mobile number, no answer, left HIPAA compliant voicemail message, and requested call back.   Objective:Per KPN (Knowledge Performance Now, point of care tool) and chart review,patient hospitalized 10/10/2019 - 10/14/2019 for fevers. Patient hospitalized 09/27/2019 - 10/04/2019 for Perineal soft tissue infection with abscess, DKA, status postdebridement of skin, soft tissue rightgroin and perineummeasuring22x8x4 cmon 3/232021. Patient also has a history of diabetes, cellulitis, and MRSA infection.    Assessment: Received [facility] Liaison referral on 10/05/2019. Referral source: [name]. Referral reason: transition of care for diabetes management.Transition of care follow up completedand will follow up weekly for the next 4 weeks.[facility] for [facility] patient assistance program identification.    Plan:RNCM will call patient for 3rd telephone outreach attempt, within 7 business days, transition of care weekly follow up, if no return call,  will proceed with case closure within 10 business days, after 3rd unsuccessful outreach call.        Clarita Mcelvain H. Gardiner Barefoot, BSN, CCM [facility] Care Management [facility] Telephonic CM Phone: [phone] Fax: [phone]

## 2019-11-09 ENCOUNTER — Telehealth: Payer: Self-pay | Admitting: Internal Medicine

## 2019-11-09 ENCOUNTER — Other Ambulatory Visit: Payer: Self-pay

## 2019-11-09 ENCOUNTER — Ambulatory Visit (INDEPENDENT_AMBULATORY_CARE_PROVIDER_SITE_OTHER): Payer: 59 | Admitting: Internal Medicine

## 2019-11-09 ENCOUNTER — Ambulatory Visit: Payer: 59 | Admitting: Internal Medicine

## 2019-11-09 ENCOUNTER — Encounter: Payer: Self-pay | Admitting: Internal Medicine

## 2019-11-09 VITALS — BP 130/88 | HR 83 | Temp 97.7°F | Ht 66.0 in | Wt 192.0 lb

## 2019-11-09 DIAGNOSIS — S31109A Unspecified open wound of abdominal wall, unspecified quadrant without penetration into peritoneal cavity, initial encounter: Secondary | ICD-10-CM

## 2019-11-09 NOTE — Progress Notes (Signed)
   Subjective:     Patient ID: Logan Weiss, male    DOB: Mar 30, 1971, 49 y.o.   MRN: [id number]  Chief Complaint  Patient presents with  . Follow-up    open wound right of the groin    HPI: The patient is a 49 y.o. male here for follow-up of right groin wound.  Patient has been doing twice daily dressing changes with gauze.  He received the collagen dressings however has not started using them.  He denies acute pain, purulent drainage or increased warmth to the area.  No complaints today.  Review of Systems  All other systems reviewed and are negative.    has a past medical history of Cellulitis (11/2017), Diabetes (HCC), and History of MRSA infection.  has a past surgical history that includes Appendectomy; right knee surgery; Knee surgery; Incision and drainage perirectal abscess (Right, 09/27/2019); and Irrigation and debridement abscess (Right, 09/29/2019).  reports that he quit smoking about 7 weeks ago. His smoking use included cigarettes. He smoked 0.00 packs per day for 5.00 years. He has never used smokeless tobacco. Objective:   Vital Signs BP 130/88 (BP Location: Left Arm, Patient Position: Sitting, Cuff Size: Large)   Pulse 83   Temp 97.7 F (36.5 C) (Temporal)   Ht 5\' 6"  (1.676 m)   Wt 192 lb (87.1 kg)   SpO2 98%   BMI 30.99 kg/m  Vital Signs and Nursing Note Reviewed Physical Exam  Constitutional: He is well-developed, well-nourished, and in no distress.  Skin:  Right groin wound measures:  11.0 x 4.0 x 1.5cm Granulation tissue throughout   Scant blood noted in the wound         Assessment/Plan:     ICD-10-CM   1. 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  The patient has been using wet to dry dressings twice daily.  I applied collagen dressing in office today.  Instructions were given to the  patient to do these daily and stop wet to dry dressings.  Will have patient follow up in one week to reassess how the collagen dressings have been doing.  Plan -collagen dressing change daily - follow up in one week   , DO 11/09/2019, 3:18 PM

## 2019-11-09 NOTE — Telephone Encounter (Signed)
Patient called because he said he received collagen dressing and they are only about 2 inches. He wanted to clarify whether he should cover the entire wound or just the central part with the bandage. He'll bring his supplies to his visit today as well.

## 2019-11-09 NOTE — Telephone Encounter (Signed)
Patient was seen in the office today by Dr. Hoffman.//AB/CMA 

## 2019-11-10 ENCOUNTER — Other Ambulatory Visit: Payer: Self-pay | Admitting: *Deleted

## 2019-11-10 NOTE — Patient Outreach (Addendum)
[facility]) Care Management  11/10/2019  Logan Weiss 22-Aug-1970 [id number]   [facility]) Care Management  [facility] Care Manager  11/10/2019   Logan Weiss 02/29/1971 [id number]  Subjective: Received voicemail message from [facility] states he is returning call, and requested call back.   Telephone call to patient's home  / mobile number, no answer, left HIPAA compliant voicemail message, and requested call back. Telephone call from patient's home / mobile number, spoke with patient, and HIPAA verified.  Patient states he is doing well, out running errands for wife, now able to driving, is very excited, and things going well overall.   States he is continuing to have weekly wound care MD visits, last visit on 11/09/2019, and next visit will be on 11/16/2019.   States wound dressing type was changed, wife continues to do daily wound care, wound healing well, and has weekly follow up visits with wound care MD, every Monday.   Patient states he will have a follow up visit with primary MD on 11/19/2019, planning to take meter, and blood glucose logs to the appointment.   States his blood glucose is staying below 200, range is 73 -182.   States he was told if blood sugar is below 80 not to take insulin.  RNCM gave patient verbal education on knowing target blood glucose, target weight, sick day rules for diabetes, and obtaining weight goal, patient voices understanding, planning to address with primary MD at next appointment.  Patient advised RNCM that his blood glucose reading for today  was : 82, 83, and 113.   Patient states his wife is preparing low carb meals based on past hospital meals menu choices.  Patient states he does not have any Secretary/administrator, transportation, [employer], or pharmacy needs at this time.  States he is very appreciative of the follow up and is in agreement to continue to receive [facility] Care Management services.     Objective: Per KPN  (Knowledge Performance Now, point of care tool) and chart review,patient hospitalized 10/10/2019 - 10/14/2019 for fevers. Patient hospitalized 09/27/2019 - 10/04/2019 for Perineal soft tissue infection with abscess, DKA, status postdebridement of skin, soft tissue rightgroin and perineummeasuring22x8x4 cmon 3/232021. Patient also has a history of diabetes, cellulitis, and MRSA infection.    Encounter Medications:  Outpatient Encounter Medications as of 11/10/2019  Medication Sig  . ascorbic acid (VITAMIN C) 500 MG tablet Take 500 mg by mouth daily.  Marland Kitchen atorvastatin (LIPITOR) 20 MG tablet Take 20 mg by mouth daily.  . Cholecalciferol (VITAMIN D-400 PO) Take by mouth.  . GlucoCom Lancets MISC Check blood sugar TID & QHS  . glucose blood (CHOICE DM FORA G20 TEST STRIPS) test strip Use as instructed  . insulin aspart (NOVOLOG) 100 UNIT/ML FlexPen Inject 12 Units into the skin 3 (three) times daily with meals. (Patient taking differently: Inject 8 Units into the skin 3 (three) times daily with meals. )  . insulin glargine (LANTUS) 100 UNIT/ML Solostar Pen Inject 35 Units into the skin at bedtime.  . Insulin Pen Needle 32G X 4 MM MISC use to inject insulin 4 times a day  . vitamin E 1000 UNIT capsule Take 1,000 Units by mouth daily.   No facility-administered encounter medications on file as of 11/10/2019.    Functional Status:  In your present state of health, do you have any difficulty performing the following activities: 11/10/2019 09/28/2019  Hearing? N N  Vision? N N  Difficulty concentrating  or making decisions? N N  Walking or climbing stairs? N N  Dressing or bathing? N N  Doing errands, shopping? N N  Preparing Food and eating ? N -  Using the Toilet? N -  In the past six months, have you accidently leaked urine? N -  Do you have problems with loss of bowel control? N -  Managing your Medications? N -  Managing your Finances? N -  Housekeeping or managing your Housekeeping? N -   Some recent data might be hidden    Fall/Depression Screening: Fall Risk  10/29/2019  Falls in the past year? 0  Number falls in past yr: 0  Injury with Fall? 0  Risk for fall due to : Other (Comment)  Risk for fall due to: Comment Dizzy episode no fall  Follow up Education provided   [facility] 2/9 Scores 10/29/2019  PHQ - 2 Score 0    Assessment: Received [facility] Liaison referral on 10/05/2019. Referral source: Natividad Brood. Referral reason: transition of care for diabetes management.Transition of care follow up completedand will follow up weekly for the next 4 weeks.   THN CM Care Plan Problem One     Most Recent Value  Care Plan Problem One  Risk for hospital readmission secondary to poorly controlled diabetes and perineal abscess wound.  Role Documenting the Problem One  Care Management Telephonic Coordinator  Care Plan for Problem One  Active  THN Long Term Goal   Over the next 90 days, patient will not be hospitalized for complications related to chronic illnesses.  THN Long Term Goal Start Date  10/29/19  Interventions for Problem One Long Term Goal  Discussed signs and symptoms of low blood sugar, determining what patient's symptoms may be.   THN CM Short Term Goal #1   Over the next 14 days, patient will have follow up office visit appointments with his wound care specialist, as evidenced by patient reporting during [facility] RN CM outreach.  THN CM Short Term Goal #1 Start Date  10/29/19  Interventions for Short Term Goal #1  RNCM verified dates and status of last wound care visit.   THN CM Short Term Goal #2   Over the next 14 days, patient's blood glucose values will be below 200, as as evidenced by patient reporting during [facility] RN CM outreach  [facility] CM Short Term Goal #2 Start Date  10/29/19  Interventions for Short Term Goal #2  Discussed verbal  education regarding target blood sugar. Discussed timeframe to digest current educationl materials  prior to send additional materials.          Plan: RNCM will send primary MD barrier/ involvement letter and route assessment.  (Per [facility] / Epic, primary MD has incomplete routing information and unable to send letter.)  RNCM will  to contact primary MD's office to obtain updated contact information, within 3 business days.  RNCM updated fax number, sent MD  barrier/ involvement letter and route assessment.  RNCM will call patient for telephone outreach attempt, within 7 business days, transition of care weekly follow up.     Tilman Mcclaren H. Annia Friendly, BSN, [place] [facility] Telephonic CM Phone: [phone] Fax: [phone]

## 2019-11-11 ENCOUNTER — Ambulatory Visit: Payer: Self-pay | Admitting: *Deleted

## 2019-11-13 ENCOUNTER — Telehealth: Payer: Self-pay

## 2019-11-13 NOTE — Telephone Encounter (Signed)
Patient indicated that his wound has been bleeding more since he has been using the collagen. Not a lot, but more than before. Advised him that Dr. Mikey Bussing is not in the office today and go back to using wet to dry until he comes in for his appointment Monday at 10:00

## 2019-11-13 NOTE — Telephone Encounter (Signed)
Patient called to inform us that he has noticed increased bleeding since using the collagen dressing for the past week.

## 2019-11-16 ENCOUNTER — Ambulatory Visit: Payer: 59 | Admitting: Internal Medicine

## 2019-11-16 ENCOUNTER — Other Ambulatory Visit: Payer: Self-pay

## 2019-11-16 ENCOUNTER — Encounter: Payer: Self-pay | Admitting: Internal Medicine

## 2019-11-16 ENCOUNTER — Ambulatory Visit (INDEPENDENT_AMBULATORY_CARE_PROVIDER_SITE_OTHER): Payer: 59 | Admitting: Internal Medicine

## 2019-11-16 VITALS — BP 127/82 | HR 83 | Temp 97.3°F | Ht 60.0 in | Wt 186.6 lb

## 2019-11-16 DIAGNOSIS — S31109A Unspecified open wound of abdominal wall, unspecified quadrant without penetration into peritoneal cavity, initial encounter: Secondary | ICD-10-CM

## 2019-11-16 MED ORDER — TRAMADOL HCL 50 MG PO TABS: 50.0000 mg | ORAL_TABLET | Freq: Three times a day (TID) | ORAL | 0 refills | Status: AC | PRN

## 2019-11-16 NOTE — Progress Notes (Signed)
   Subjective:     Patient ID: Logan Weiss, male    DOB: 1970/07/12, 49 y.o.   MRN: [id number]  Chief Complaint  Patient presents with  . Follow-up    open wound of the groin    HPI: The patient is a 49 y.o. male here for follow-up of right groin wound  Patient has started using collagen twice daily with dressing changes.  He stopped using collagen when he noticed more bleeding to the wound area and started up using wet-to-dry dressings 3 days ago.  He denies acute pain, purulent drainage or increased warmth to the area.    Review of Systems  All other systems reviewed and are negative.    has a past medical history of Cellulitis (11/2017), Diabetes (HCC), and History of MRSA infection.  has a past surgical history that includes Appendectomy; right knee surgery; Knee surgery; Incision and drainage perirectal abscess (Right, 09/27/2019); and Irrigation and debridement abscess (Right, 09/29/2019).  reports that he quit smoking about 8 weeks ago. His smoking use included cigarettes. He smoked 0.00 packs per day for 5.00 years. He has never used smokeless tobacco. Objective:   Vital Signs BP 127/82 (BP Location: Left Arm, Patient Position: Sitting, Cuff Size: Large)   Pulse 83   Temp (!) 97.3 F (36.3 C) (Temporal)   Ht 5' (1.524 m)   Wt 186 lb 9.3 oz (84.6 kg)   SpO2 100%   BMI 36.44 kg/m  Vital Signs and Nursing Note Reviewed Physical Exam  Constitutional: He is well-developed, well-nourished, and in no distress.  Skin:  Right groin wound measures:  11 x 3 x 1.5 cm Good granulation tissue noted throughout No bleeding noted until cleaned with saline and gauze No tunneling noted         Assessment/Plan:     ICD-10-CM   1. 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  There is good granulation tissue throughout with no signs of infection  today.  The wound continues to fill in and no tunneling was noted.  I am beginning to see epithelialization at the cephalad region of the wound.  The patient is concerned about more bleeding with dressing changes.  I suspect this is increased vascularization from healing and collagen dressing changes.  Since he is reporting moderate to heavy drainage I will change to a foam dressing.  Plan - dressing change with foam dressing in office -Switch to foam dressing, supplies will be updated through [facility] -Follow-up in 1 week   Aldean Baker, DO 11/16/2019, 3:13 PM

## 2019-11-18 ENCOUNTER — Telehealth: Payer: Self-pay | Admitting: *Deleted

## 2019-11-18 ENCOUNTER — Telehealth: Payer: Self-pay | Admitting: Internal Medicine

## 2019-11-18 ENCOUNTER — Telehealth: Payer: Self-pay

## 2019-11-18 NOTE — Telephone Encounter (Signed)
Could he quantify how much bleeding is occurring?  If he thinks he is bleeding more than usual then I can see him tomorrow.  Him and his wife have expressed this concern on multiple occassions and each exam has been reassuring but I am happy to re-evaluate.

## 2019-11-18 NOTE — Telephone Encounter (Signed)
See note on (11/18/19)//AB/CMA

## 2019-11-18 NOTE — Telephone Encounter (Signed)
Patient said that he was switched to foam bandage from collagen and he noticed that it's bleeding more and the bandage is becoming saturated. He said it hurt last night so much that he couldn't fall sleep. Please call to advise what he should do.

## 2019-11-18 NOTE — Telephone Encounter (Signed)
Pt verified that he returned the call to [facility] regarding his dressing/wound care supplies [facility] informed pt that the order for the silver foam dressing will not be covered by his insurance & that he could purchase them Pt informed them that he still had collagen dressing & wishes to use them until finished & then he will call them back to place the order for the silver foam dressing at that time

## 2019-11-18 NOTE — Telephone Encounter (Signed)
Call to pt re: his concern with increased pain & bleeding through the dressing overnight He reports pain level 8/10 last night & "foam dressing" was saturated with blood upon waking. He did have trouble sleeping d/t pain- & was required to take RX pain meds  This am he states that the pain has decreased to 5/10 & after changing the dressing & using "collagen" dressing - there has been minimal bleeding & has only changed the dressing once today He denies any other complications- no fever/chills & no drainage/yellow or foul smelling  I offered for Dr. Mikey Bussing to see him tomorrow if he needed-& he would like to keep his f/u appointment on Mon 11/23/19 with her & he understands to call for any changes or other concerns Pt agrees with plan of care

## 2019-11-18 NOTE — Telephone Encounter (Signed)
Faxed order on (11/16/19) to [facility] for supplies for the patient.  Confirmation received.  Copy scanned into the chart.   Received on (11/17/19) Order Status Notification from [facility] stating: We have attempted to contact the patient regarding purchasing their supplies as they are not covered by the patient's insurance.  The patient will call us back to order.  Thank you!//AB/CMA

## 2019-11-20 ENCOUNTER — Other Ambulatory Visit: Payer: Self-pay | Admitting: *Deleted

## 2019-11-20 NOTE — Patient Outreach (Signed)
[facility]) Care Management  11/20/2019  Logan Weiss 03-15-1971 [id number]   Subjective: Telephone call to patient's home  / mobile number, no answer, left HIPAA compliant voicemail message, and requested call back.   Objective: Per KPN (Knowledge Performance Now, point of care tool) and chart review,patient hospitalized 10/10/2019 - 10/14/2019 for fevers. Patient hospitalized 09/27/2019 - 10/04/2019 for Perineal soft tissue infection with abscess, DKA, status postdebridement of skin, soft tissue rightgroin and perineummeasuring22x8x4 cmon 3/232021. Patient also has a history of diabetes, cellulitis, and MRSA infection.    Assessment: Received [facility] Liaison referral on 10/05/2019. Referral source: Logan Weiss. Referral reason: transition of care for diabetes management.Transition of care follow up completedand will follow up weekly for the next 4 weeks.     Plan: RNCM has sent  primary MD barrier/ involvement letter and route assessment, to updated fax number [phone]). RNCM will call patient for 2nd telephone outreach attempt, within 7 business days, transition of care weekly follow up.     Logan Weiss H. Gardiner Barefoot, BSN, CCM [facility] Care Management [facility] Telephonic CM Phone: [phone] Fax: [phone]

## 2019-11-23 ENCOUNTER — Other Ambulatory Visit: Payer: Self-pay

## 2019-11-23 ENCOUNTER — Encounter: Payer: Self-pay | Admitting: Internal Medicine

## 2019-11-23 ENCOUNTER — Ambulatory Visit (INDEPENDENT_AMBULATORY_CARE_PROVIDER_SITE_OTHER): Payer: 59 | Admitting: Internal Medicine

## 2019-11-23 VITALS — BP 133/84 | HR 65 | Temp 97.5°F | Ht 60.0 in | Wt 187.0 lb

## 2019-11-23 DIAGNOSIS — S31109D Unspecified open wound of abdominal wall, unspecified quadrant without penetration into peritoneal cavity, subsequent encounter: Secondary | ICD-10-CM

## 2019-11-23 NOTE — Patient Instructions (Signed)
   [name] It was a pleasure seeing you today.  Please follow the instructions below for your wound care  1) collagen dressing changes daily 2) followed by non stick pad (ABD)  Please follow up with me in 2-3 weeks.  Call us at [phone] with any questions or concerns

## 2019-11-23 NOTE — Progress Notes (Signed)
   Subjective:     Patient ID: Logan Weiss, male    DOB: 08-03-1970, 50 y.o.   MRN: [id number]  Chief Complaint  Patient presents with  . Follow-up    1 week for (R) groin wound    HPI: The patient is a 49 y.o. male here for follow-up of right groin wound  Patient was switched to foam dressing at last clinic visit 1 week ago.  He states that after 1 day of using foam dressing he noticed more bleeding and more pain.  He switched back to collagen and has noticed improvement in pain and bleeding.  He currently denies any acute pain, purulent drainage or increased warmth to the area.  Review of Systems  All other systems reviewed and are negative.    has a past medical history of Cellulitis (11/2017), Diabetes (HCC), and History of MRSA infection.  has a past surgical history that includes Appendectomy; right knee surgery; Knee surgery; Incision and drainage perirectal abscess (Right, 09/27/2019); and Irrigation and debridement abscess (Right, 09/29/2019).  reports that he quit smoking about 2 months ago. His smoking use included cigarettes. He smoked 0.00 packs per day for 5.00 years. He has never used smokeless tobacco. Objective:   Vital Signs BP 133/84 (BP Location: Left Arm, Patient Position: Sitting, Cuff Size: Large)   Pulse 65   Temp (!) 97.5 F (36.4 C) (Temporal)   Ht 5' (1.524 m)   Wt 187 lb (84.8 kg)   SpO2 (!) 82%   BMI 36.52 kg/m  Vital Signs and Nursing Note Reviewed Physical Exam  Constitutional: He is well-developed, well-nourished, and in no distress.  Skin:  Right groin wound measures:  11 x 2.5 x 1.5 cm Good granulation tissue noted throughout No bleeding noted until cleaned with saline and gauze No tunneling noted          Assessment/Plan:     ICD-10-CM   1. 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  There are no signs of  infection today.  I am unsure why he was having increased pain with foam dressing.  When I do a dressing change in the office there is bleeding and I noted this to the patient to be normal.  He is currently doing daily dressing changes with collagen and I recommended he continue this.  I will see him back in 2 to 3 weeks.  Plan -Dressing change with collagen dressing in office -Follow-up in 2 to 3 weeks -Continue daily collagen dressing changes  Aldean Baker, DO 11/23/2019, 10:16 AM

## 2019-11-24 ENCOUNTER — Other Ambulatory Visit: Payer: Self-pay | Admitting: *Deleted

## 2019-11-24 NOTE — Patient Outreach (Signed)
[facility]) Care Management  11/24/2019  Logan Weiss 1971/03/23 [id number]   Subjective: Received voicemail message from [facility], states he is returning call, and requested call back.  Telephone call to patient's home  / mobile number, no answer, left HIPAA compliant voicemail message, and requested call back.    Objective:Per KPN (Knowledge Performance Now, point of care tool) and chart review,patient hospitalized 10/10/2019 - 10/14/2019 for fevers. Patient hospitalized 09/27/2019 - 10/04/2019 for Perineal soft tissue infection with abscess, DKA, status postdebridement of skin, soft tissue rightgroin and perineummeasuring22x8x4 cmon 3/232021. Patient also has a history of diabetes, cellulitis, and MRSA infection.    Assessment:Received [facility] Liaison referral on 10/05/2019. Referral source: [name]. Referral reason: transition of care for diabetes management.Transition of care follow up completedand will follow up weekly for the next 4 weeks.     Plan:RNCM has sent  primary MD barrier/ involvement letter and route assessment, to updated fax number [phone]). RNCM will call patient for 3rd telephone outreach attempt, within 7 business days, transition of care weekly follow up.     Logan Weiss, BSN, CCM [facility] Care Management [facility] Telephonic CM Phone: [phone] Fax: [phone]

## 2019-11-25 ENCOUNTER — Ambulatory Visit: Payer: Self-pay | Admitting: *Deleted

## 2019-11-26 ENCOUNTER — Other Ambulatory Visit: Payer: Self-pay | Admitting: *Deleted

## 2019-11-26 NOTE — Patient Outreach (Addendum)
[facility]) Care Management  11/26/2019  Logan Weiss March 20, 1971 [id number]   Subjective: Telephone call to patient's home  / mobile number, no answer, left HIPAA compliant voicemail message, and requested call back.    Objective:Per KPN (Knowledge Performance Now, point of care tool) and chart review,patient hospitalized 10/10/2019 - 10/14/2019 for fevers. Patient hospitalized 09/27/2019 - 10/04/2019 for Perineal soft tissue infection with abscess, DKA, status postdebridement of skin, soft tissue rightgroin and perineummeasuring22x8x4 cmon 3/232021. Patient also has a history of diabetes, cellulitis, and MRSA infection.    Assessment:Received [facility] Liaison referral on 10/05/2019. Referral source: [name]. Referral reason: transition of care for diabetes management.Transition of care follow up completedand will follow up weekly for the next 4 weeks.     Plan:RNCMhas sentprimary MD barrier/ involvement letter and route assessment, to updated fax number [phone]). RNCM will call patient for4th telephone outreach attempt, within 21 business days, transition of care weekly follow up.     Claretta Kendra H. Gardiner Barefoot, BSN, CCM [facility] Care Management [facility] Telephonic CM Phone: [phone] Fax: [phone]

## 2019-11-27 ENCOUNTER — Encounter: Payer: Self-pay | Admitting: *Deleted

## 2019-11-27 ENCOUNTER — Other Ambulatory Visit: Payer: Self-pay | Admitting: *Deleted

## 2019-11-27 NOTE — Patient Outreach (Signed)
[facility]) Care Management  [facility] Care Manager  11/27/2019   Logan Weiss 03-Nov-1970 [id number]  Subjective: Received voicemail message from [facility], times 2, states he is returning call, and requested call back.   Telephone call to patient's home / mobile number, spoke with patient, and HIPAA verified.  Patient states he is doing well currently in the pharmacy picking up medications, asked RNCM to hold while he checks out, and gets back in the car. States his last follow up visit with wound care specialist went well, wound healing, getting smaller in size, wound open area is filling in nicely, verbally described the size of the wound, and next follow up appointment will be in 3 weeks on 12/17/2019.   States specialist is estimating the wound may be closed / completely healed in approximately 1 month.   States his blood sugars are continuing to improve, range is 43 - 190, recording blood sugar reading everyday in log book, taking log book to all MD appointments, and is also recording blood sugar in [facility] app.  States he had a follow up appointment with primary MD on 11/19/2019, appointment went well,  next appointment is on 01/19/2020, and MD planning to draw A1C during next appointment.   Primary MD answered the following questions: patient's target A1C is 7.0 (per KPN  (Knowledge Performance Now, point of care tool)  last A1C was  13.3 on 09/27/2019), sick day plan is the same as when he is well, if blood sugar is below 80 and not eating do not take insulin, patient also to report illness to MD if office is open, schedule follow up appointment if needed, patient states he is aware of signs/ symptoms to report, how to reach provider if needed after hours, when to go to ED, and / or call 911.   States his target weight goal is 175 and is current weight is 183.   Patient states his diabetes was managed by healthy weight, diet, and exercise, in the past.   States he did not have to  take any oral diabetic meds, or insulin, and his ultimate goal is to return to this treatment plan.   States he feels his ultimate goal is obtainable, since he has managed his diabetes like this in the past.    States he currently can not exercise due to the location of his wound because it starts hurting if he exercises, and plans to resume once wound healed.  States primary MD is planning to evaluate at next appointment  if patient can discontinue lunch time insulin dose if blood sugar continues to be low, based on log book data.   States he is currently using [employer] brand glucometer (Advantage Glucose Meter) for blood sugar monitoring 4 times a day.   Patient states he did receive [facility] Care Management consent, has filled it out,  and mailed it on 5/202/2021.    States he still have plenty of diabetes educational materials that he received from [facility] dietician and will let RNCM know when he is ready for additional educational materials to be sent out.  Patient states he does not have any education material, transition of care, transportation, [employer], or pharmacy needs at this time.  Patient aware he has completed [facility] Care Management transition of care follow up, is in agreement to be followed for complex diabetes management, care coordination, and wound care follow up as needed.   States he is very Adult nurse of the  follow up and is in agreement to continue receive [facility] information/ services.     Objective:Per KPN (Knowledge Performance Now, point of care tool) and chart review,patient hospitalized 10/10/2019 - 10/14/2019 for fevers. Patient hospitalized 09/27/2019 - 10/04/2019 for Perineal soft tissue infection with abscess, DKA, status postdebridement of skin, soft tissue rightgroin and perineummeasuring22x8x4 cmon 3/232021. Patient also has a history of diabetes, cellulitis, and MRSA infection.     Encounter Medications:  Outpatient Encounter Medications as  of 11/27/2019  Medication Sig  . ascorbic acid (VITAMIN C) 500 MG tablet Take 500 mg by mouth daily.  Marland Kitchen atorvastatin (LIPITOR) 20 MG tablet Take 20 mg by mouth daily.  . Cholecalciferol (VITAMIN D-400 PO) Take by mouth.  . GlucoCom Lancets MISC Check blood sugar TID & QHS  . glucose blood (CHOICE DM FORA G20 TEST STRIPS) test strip Use as instructed  . insulin aspart (NOVOLOG) 100 UNIT/ML FlexPen Inject 12 Units into the skin 3 (three) times daily with meals. (Patient taking differently: Inject 8 Units into the skin 3 (three) times daily with meals. )  . insulin glargine (LANTUS) 100 UNIT/ML Solostar Pen Inject 35 Units into the skin at bedtime.  . Insulin Pen Needle 32G X 4 MM MISC use to inject insulin 4 times a day  . Vitamin D, Cholecalciferol, 10 MCG (400 UNIT) CAPS Take by mouth.  . vitamin E 1000 UNIT capsule Take 1,000 Units by mouth daily.   No facility-administered encounter medications on file as of 11/27/2019.    Functional Status:  In your present state of health, do you have any difficulty performing the following activities: 11/10/2019 09/28/2019  Hearing? N N  Vision? N N  Difficulty concentrating or making decisions? N N  Walking or climbing stairs? N N  Dressing or bathing? N N  Doing errands, shopping? N N  Preparing Food and eating ? N -  Using the Toilet? N -  In the past six months, have you accidently leaked urine? N -  Do you have problems with loss of bowel control? N -  Managing your Medications? N -  Managing your Finances? N -  Housekeeping or managing your Housekeeping? N -  Some recent data might be hidden    Fall/Depression Screening: Fall Risk  10/29/2019  Falls in the past year? 0  Number falls in past yr: 0  Injury with Fall? 0  Risk for fall due to : Other (Comment)  Risk for fall due to: Comment Dizzy episode no fall  Follow up Education provided   [facility] 2/9 Scores 10/29/2019  PHQ - 2 Score 0    Assessment:Received [facility] Liaison referral on 10/05/2019. Referral source: Natividad Brood. Referral reason: transition of care for diabetes management.Transition of care follow up completedand will follow up weekly for the next 4 weeks.  THN CM Care Plan Problem One     Most Recent Value  Care Plan Problem One  Risk for hospital readmission secondary to poorly controlled diabetes and perineal abscess wound.  Role Documenting the Problem One  Care Management Telephonic Coordinator  Care Plan for Problem One  Active  THN Long Term Goal   Over the next 90 days, patient will not be hospitalized for complications related to chronic illnesses.  THN Long Term Goal Start Date  10/29/19  Interventions for Problem One Long Term Goal  Discussed sick day plan and how to reach provider after hours if needed.   THN CM Short  Term Goal #1   Over the next 14 days, patient will have follow up office visit appointments with his wound care specialist, as evidenced by patient reporting during [facility] RN CM outreach.  THN CM Short Term Goal #1 Start Date  10/29/19  Interventions for Short Term Goal #1  RNCM verified wound status and date of next follow up appointment.  THN CM Short Term Goal #2   Over the next 14 days, patient's blood glucose values will be below 200, as as evidenced by patient reporting during [facility] RN CM outreach  [facility] CM Short Term Goal #2 Start Date  10/29/19  Interventions for Short Term Goal #2  Discussed target A1C goal, blood sugar range,and plan for low blood sugar.         Plan: RNCM will call patient for telephone outreach attempt, within 21 business days, complex diabetes care management, and wound care follow up.     Brunetta Newingham H. Gardiner Barefoot, BSN, CCM [facility] Care Management [facility] Telephonic CM Phone: [phone] Fax: [phone]

## 2019-12-17 ENCOUNTER — Ambulatory Visit: Payer: Self-pay | Admitting: *Deleted

## 2019-12-17 ENCOUNTER — Ambulatory Visit (INDEPENDENT_AMBULATORY_CARE_PROVIDER_SITE_OTHER): Payer: 59 | Admitting: Internal Medicine

## 2019-12-17 ENCOUNTER — Encounter: Payer: Self-pay | Admitting: Internal Medicine

## 2019-12-17 ENCOUNTER — Other Ambulatory Visit: Payer: Self-pay

## 2019-12-17 VITALS — BP 128/87 | HR 79 | Temp 97.8°F

## 2019-12-17 DIAGNOSIS — S31109D Unspecified open wound of abdominal wall, unspecified quadrant without penetration into peritoneal cavity, subsequent encounter: Secondary | ICD-10-CM | POA: Diagnosis not present

## 2019-12-17 MED ORDER — OXYCODONE HCL 5 MG PO TABS: 5.0000 mg | ORAL_TABLET | Freq: Three times a day (TID) | ORAL | 0 refills | Status: AC | PRN

## 2019-12-17 NOTE — Progress Notes (Signed)
   Subjective:     Patient ID: Logan Weiss, male    DOB: Feb 22, 1971, 49 y.o.   MRN: [id number]  Chief Complaint  Patient presents with  . Follow-up    HPI: The patient is a 49 y.o. male here for follow-up of right groin wound  Patient states he is doing collagen dressing changes daily.  Wife helps with the dressing changes and has reported improvement in the wound.  The patient states he has been more mobile recently due to improvement in the wound.  He is able to do some house work.  He does state that after about 30 minutes of doing light duty or sitting at the computer he experiences pain.  He states he uses 1-2 tramadol daily but this is not helping enough with the pain.  He has taken oxycodone in the past with better relief. Patient denies purulent drainage, erythema or increased warmth to the area   Review of Systems  All other systems reviewed and are negative.    has a past medical history of Cellulitis (11/2017), Diabetes (HCC), and History of MRSA infection.  has a past surgical history that includes Appendectomy; right knee surgery; Knee surgery; Incision and drainage perirectal abscess (Right, 09/27/2019); and Irrigation and debridement abscess (Right, 09/29/2019).  reports that he quit smoking about 2 months ago. His smoking use included cigarettes. He smoked 0.00 packs per day for 5.00 years. He has never used smokeless tobacco. Objective:   Vital Signs BP 128/87 (BP Location: Left Arm, Patient Position: Sitting, Cuff Size: Large)   Pulse 79   Temp 97.8 F (36.6 C) (Temporal)   SpO2 100%  Vital Signs and Nursing Note Reviewed Physical Exam Skin:    Comments: Right groin wound measures:  11 x 1.5 x 0.5 cm Good granulation tissue noted throughout No bleeding noted until cleaned with saline and gauze No tunneling noted       Assessment/Plan:     ICD-10-CM   1. Right groin wound, subsequent encounter  S31.109D     Assessment:  Right groin wound s/p debridement  for necrotizing fasciitis  The wound has shown great improvement in healing over the past couple of weeks.  There is good granulation tissue without signs of infection.  I encouraged to continue using collagen daily with dressing changes.  Pain continues to be an issue for the patient.  He states that oxycodone has helped in the past and since he is more mobile now he is experiencing more pain.  I will do a short course of pain mediation.  Plan -Dressing change with collagen dressing in the office -follow up in 2-3 weeks -continue daily collagen dressing changes - oxycodone q8h, for 5 days #15  Aldean Baker, DO 12/17/2019, 10:41 AM

## 2019-12-21 ENCOUNTER — Ambulatory Visit: Payer: Self-pay | Admitting: *Deleted

## 2019-12-22 ENCOUNTER — Ambulatory Visit: Payer: Self-pay | Admitting: *Deleted

## 2019-12-23 ENCOUNTER — Other Ambulatory Visit: Payer: Self-pay | Admitting: *Deleted

## 2019-12-23 NOTE — Patient Outreach (Signed)
[facility]) Care Management  12/23/2019  Logan Weiss 1970/07/16 [id number]   Subjective: Received voicemail message from [name] on 12/21/2019, states he is need of additional Novo [employer]isk blood glucose monitoring logs titled "Keeping Well with Diabetes", has found logs to be very helpful in communicating with providers during follow up appointment, helpful for daily monitoring, and requested call back.   Telephone call to patient's home / mobile number, spoke with patient, and HIPAA verified.  Patient states he is doing well, has no pain at this time, does have pain periodically related to wound, and pain being managed with medication as needed.  States he had a follow up visit with wound MD on 12/17/2019, appointment went well, he, and provider are pleased with wound healing.   States the wound is approximately 9 centimeters from being completely closed and healed.  States MD has advised him that he may still experience periodic wound site pain after the wound has closed for a few months and then the pain should completely resolve.    States he has returned his signed [facility] Care Management consent form, advised RNCM has not received it in the chart, and will send request to [facility] Care Management Administrative Assistance to verify status.   RNCM discussed with patient,  blood sugar log options which include a electronic blood sugar log and/ or [facility] Care Management calendar,  available through [facility] Care Management.  RNCM advised unable to obtain [employer] log book.   Patient states he has already sent a request to [employer] for additional logs, approximately 1 month ago, has not received to date, and is requesting both [facility] Care Management log options be sent to him,  as a back up plan, in case he does not receive log from [employer],  before he fills up his current log book.  States he will also call [employer] today to verify status of his log book order.  Patient states he does not have any   transportation, or pharmacy needs at this time. States he is very appreciative of the follow up and is in agreement to continue to receive [facility] Care Management information / services.  RNCM verified with Caron Presume at [facility] Care Management patient's signed written consent has not been received to date.    Objective:Per KPN (Knowledge Performance Now, point of care tool) and chart review,patient hospitalized 10/10/2019 - 10/14/2019 for fevers. Patient hospitalized 09/27/2019 - 10/04/2019 for Perineal soft tissue infection with abscess, DKA, status postdebridement of skin, soft tissue rightgroin and perineummeasuring22x8x4 cmon 3/232021. Patient also has a history of diabetes, cellulitis, and MRSA infectio   Assessment:Received [facility] Liaison referral on 10/05/2019. Referral source: [name]. Referral reason: transition of care for diabetes management.Transition of care follow up completed.   Will continue to follow up monthly for diabetes monitoring / education and wound care monitoring.    Outpatient Encounter Medications as of 12/23/2019  Medication Sig  . ascorbic acid (VITAMIN C) 500 MG tablet Take 500 mg by mouth daily.  Marland Kitchen atorvastatin (LIPITOR) 20 MG tablet Take 20 mg by mouth daily.  . Cholecalciferol (VITAMIN D-400 PO) Take by mouth.  . GlucoCom Lancets MISC Check blood sugar TID & QHS  . glucose blood (CHOICE DM FORA G20 TEST STRIPS) test strip Use as instructed  . insulin aspart (NOVOLOG) 100 UNIT/ML FlexPen Inject 12 Units into the skin 3 (three) times daily with meals. (Patient taking differently: Inject 8 Units into the skin 3 (three)  times daily with meals. )  . insulin glargine (LANTUS) 100 UNIT/ML Solostar Pen Inject 35 Units into the skin at bedtime. (Patient taking differently: Inject 27 Units into the skin at bedtime. )  . Insulin Pen Needle 32G X 4 MM MISC use to inject insulin 4 times a day  . oxyCODONE (OXY IR/ROXICODONE) 5  MG immediate release tablet Take 5 mg by mouth every 4 (four) hours as needed for severe pain.  . Vitamin D, Cholecalciferol, 10 MCG (400 UNIT) CAPS Take by mouth.  . vitamin E 1000 UNIT capsule Take 1,000 Units by mouth daily.   No facility-administered encounter medications on file as of 12/23/2019.   THN CM Care Plan Problem One     Most Recent Value  Care Plan Problem One Risk for hospital readmission secondary to poorly controlled diabetes and perineal abscess wound.  Role Documenting the Problem One Care Management Telephonic Coordinator  Care Plan for Problem One Active  THN Long Term Goal  Over the next 90 days, patient will not be hospitalized for complications related to chronic illnesses.  THN Long Term Goal Start Date 10/29/19  Interventions for Problem One Long Term Goal Discussed blood sugar logs and given monitoring tool options.   THN CM Short Term Goal #1  Over the next 14 days, patient will have follow up office visit appointments with his wound care specialist, as evidenced by patient reporting during [facility] RN CM outreach.  THN CM Short Term Goal #1 Start Date 10/29/19  Interventions for Short Term Goal #1 Discussed outcome of 12/17/2019 wound specialist visit.  THN CM Short Term Goal #2  Over the next 14 days, patient's blood glucose values will be below 200, as as evidenced by patient reporting during [facility] RN CM outreach  [facility] CM Short Term Goal #2 Start Date 10/29/19  Interventions for Short Term Goal #2 Discussed daily blood glucose monitoring and reporting readings to providers as needed.        Plan: RNCM will send request to [place] Management Administrative Assistant Lavell Islam) to verify status of patient's signed consent.  RNCM will send patient an electronic version of blood sugar logs via secure email to patient's email ([email or web address]), per patient's request. RNCM will send patient blood sugar logs and [facility] Care Management Calendar via mail, per patient's  request.  RNCM will call patient for telephone outreach attempt, within 30 business days, complex diabetes care management, and wound care follow up.     Verner Mccrone H. Annia Friendly, BSN, [place] Telephonic CM Phone: [phone]

## 2020-01-04 ENCOUNTER — Telehealth: Payer: Self-pay

## 2020-01-04 NOTE — Telephone Encounter (Signed)
Returned patients call. He has been cleaning his wound with saline, using the collagen, saline again to wet collagen, the using regular guaze. He advised the top part of the wound is healing, but the lower one is slower and has been bleeding off/on and not every day.  There is no foul odor as well. Instructed him to continue the dressing in the order he has been and can use more guaze for absorbency.  Offered for him to come in sooner than his appointment 01/07/20. Patient declined and will come in at his original scheduled time.

## 2020-01-04 NOTE — Telephone Encounter (Signed)
Patient called stating his wound is bleeding more than usual and would like a call back to discuss if this is part of the normal healing process.

## 2020-01-07 ENCOUNTER — Encounter: Payer: Self-pay | Admitting: Internal Medicine

## 2020-01-07 ENCOUNTER — Other Ambulatory Visit: Payer: Self-pay

## 2020-01-07 ENCOUNTER — Ambulatory Visit (INDEPENDENT_AMBULATORY_CARE_PROVIDER_SITE_OTHER): Payer: 59 | Admitting: Internal Medicine

## 2020-01-07 VITALS — BP 111/75 | HR 60 | Temp 97.3°F

## 2020-01-07 DIAGNOSIS — S31109D Unspecified open wound of abdominal wall, unspecified quadrant without penetration into peritoneal cavity, subsequent encounter: Secondary | ICD-10-CM | POA: Diagnosis not present

## 2020-01-07 MED ORDER — OXYCODONE HCL 5 MG PO TABS: 5.0000 mg | ORAL_TABLET | Freq: Four times a day (QID) | ORAL | 0 refills | Status: AC | PRN

## 2020-01-07 NOTE — Progress Notes (Signed)
   Subjective:     Patient ID: Logan Weiss, male    DOB: 1970-10-12, 49 y.o.   MRN: [id number]  Chief Complaint  Patient presents with  . Follow-up fo right groin wound    HPI: The patient is a 49 y.o. male here for follow-up of right groin wound  Patient states he is doing collagen dressing changes daily.  Wife changes the dressings and notes continued improvement to the wound healing.  He continues to be active and will occasionally have pain to the area at the end of the day and during dressing changes.  Oxycodone has helped relieve the pain.  Patient denies purulent drainage, erythema or increased warmth to the area   Review of Systems  All other systems reviewed and are negative.    has a past medical history of Cellulitis (11/2017), Diabetes (HCC), and History of MRSA infection.  has a past surgical history that includes Appendectomy; right knee surgery; Knee surgery; Incision and drainage perirectal abscess (Right, 09/27/2019); and Irrigation and debridement abscess (Right, 09/29/2019).  reports that he quit smoking about 3 months ago. His smoking use included cigarettes. He smoked 0.00 packs per day for 5.00 years. He has never used smokeless tobacco. Objective:   Vital Signs BP 111/75 (BP Location: Left Arm, Patient Position: Sitting, Cuff Size: Large)   Pulse 60   Temp (!) 97.3 F (36.3 C) (Temporal)   SpO2 97%  Vital Signs and Nursing Note Reviewed Physical Exam Skin:    Comments: Right groin wound measures:  11 x 1.0 x 0.2 cm Good granulation tissue noted throughout No tunneling noted         Assessment/Plan:     ICD-10-CM   1. 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  Granulation tissue present with no signs of infection.  I encouraged using collagen dressings daily for 2 more weeks and reassess the wound.  I may switch over to xeroform  at the next visit.  I will give another short course of oxycodone to help with dressing changes and ADLs.    Plan -collagen dressing change in the office -collagen dressing changes daily -oxycodone #15 -follow up in 2 weeks  Aldean Baker, DO 01/07/2020, 9:24 AM

## 2020-01-20 ENCOUNTER — Other Ambulatory Visit: Payer: Self-pay | Admitting: *Deleted

## 2020-01-20 NOTE — Patient Outreach (Signed)
[facility]) Care Management  01/20/2020  Logan Weiss 1970/08/27 [id number]   Subjective: Telephone call to patient's home / mobile number, spoke with patient, and HIPAA verified.  Patient states he is doing well, unable to talk at this time, but wanted to share some good news with this RNCM prior to ending the call.   States he had a follow up appointment with primary MD on 01/19/2020.  States he received his A1C test results today, his level is at 7.0, and he met his goal.   RNCM stated congratulations and this is awesome news!!!    RNCM will call patient back at a later time per his request to complete assessment follow up.    Objective:Per KPN (Knowledge Performance Now, point of care tool) and chart review,patient hospitalized 10/10/2019 - 10/14/2019 for fevers. Patient hospitalized 09/27/2019 - 10/04/2019 for Perineal soft tissue infection with abscess, DKA, status postdebridement of skin, soft tissue rightgroin and perineummeasuring22x8x4 cmon 3/232021. Patient also has a history of diabetes, cellulitis, and MRSA infection.   Assessment:Received [facility] Liaison referral on 10/05/2019. Referral source: Natividad Brood. Referral reason: transition of care for diabetes management.Transition of care follow up completed.   Will continue to follow up monthly for diabetes monitoring / education and wound care monitoring, pending patient contact.    Plan:RNCM will call patient for 2nd telephone outreach attempt, within7 business days, complex diabetes care management, and wound care follow up.    Pearlene Teat H. Annia Friendly, BSN, [place] Management [facility] Telephonic CM Phone: [phone] Fax: [phone]

## 2020-01-21 ENCOUNTER — Ambulatory Visit: Payer: Self-pay | Admitting: *Deleted

## 2020-01-21 ENCOUNTER — Encounter: Payer: Self-pay | Admitting: Internal Medicine

## 2020-01-21 ENCOUNTER — Ambulatory Visit (INDEPENDENT_AMBULATORY_CARE_PROVIDER_SITE_OTHER): Payer: 59 | Admitting: Internal Medicine

## 2020-01-21 ENCOUNTER — Other Ambulatory Visit: Payer: Self-pay

## 2020-01-21 VITALS — BP 113/65 | HR 82 | Temp 98.0°F

## 2020-01-21 DIAGNOSIS — S31109D Unspecified open wound of abdominal wall, unspecified quadrant without penetration into peritoneal cavity, subsequent encounter: Secondary | ICD-10-CM

## 2020-01-21 NOTE — Progress Notes (Signed)
° °  Subjective:     Patient ID: Logan Weiss, male    DOB: September 06, 1970, 49 y.o.   MRN: [id number]  Chief Complaint  Patient presents with   Follow-up of right groin wound    HPI: The patient is a 49 y.o. male here for follow-up of right groin wound  Patient continues to use collagen dressing changes daily.  Wife reports continued improvement to the wound. At this time they have about 1 weeks worth of collagen to use daily.  Pain continues to improve.  Patient denies purulent drainage, erythema or increased warmth to the area.    Review of Systems  All other systems reviewed and are negative.    has a past medical history of Cellulitis (11/2017), Diabetes (HCC), and History of MRSA infection.  has a past surgical history that includes Appendectomy; right knee surgery; Knee surgery; Incision and drainage perirectal abscess (Right, 09/27/2019); and Irrigation and debridement abscess (Right, 09/29/2019).  reports that he quit smoking about 4 months ago. His smoking use included cigarettes. He smoked 0.00 packs per day for 5.00 years. He has never used smokeless tobacco. Objective:   Vital Signs BP 113/65 (BP Location: Right Arm, Patient Position: Sitting, Cuff Size: Large)    Pulse 82    Temp 98 F (36.7 C) (Oral)    SpO2 99%  Vital Signs and Nursing Note Reviewed  Physical Exam Skin:    Comments: Right groin cephalad wound measures: 2.0 x 1.0 x 0.2 cm Right groin caudad wound measures:  3.0 x 1.0 x 0.2 cm Granulation tissue throughout the wound bed No tunneling          Assessment/Plan:     ICD-10-CM   1. 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  It is now two small wounds and the bottom wound is closing in well.  I recommended continuing to use collagen.  The patient is now more mobile and being summertime I recommended doing dressing changes twice a day.  Once  he is out of collagen I gave him a sample of Kerracell AG to use on the wound twice daily.  This should last him for 2 more weeks.  I am hoping that the wound will be closed at that time or at least close to it.  Plan -Collagen dressing change in office -Collagen dressing changes twice a day.  Once done can switch to [employer] twice a day -Follow-up in 2 weeks  Aldean Baker, DO 01/21/2020, 9:27 AM

## 2020-01-27 ENCOUNTER — Ambulatory Visit: Payer: Self-pay | Admitting: *Deleted

## 2020-02-02 ENCOUNTER — Ambulatory Visit: Payer: Self-pay | Admitting: *Deleted

## 2020-02-04 ENCOUNTER — Encounter: Payer: Self-pay | Admitting: Internal Medicine

## 2020-02-04 ENCOUNTER — Ambulatory Visit (INDEPENDENT_AMBULATORY_CARE_PROVIDER_SITE_OTHER): Payer: 59 | Admitting: Internal Medicine

## 2020-02-04 ENCOUNTER — Other Ambulatory Visit: Payer: Self-pay

## 2020-02-04 VITALS — BP 122/78 | HR 85 | Temp 98.1°F

## 2020-02-04 DIAGNOSIS — S31109D Unspecified open wound of abdominal wall, unspecified quadrant without penetration into peritoneal cavity, subsequent encounter: Secondary | ICD-10-CM

## 2020-02-04 NOTE — Progress Notes (Signed)
   Subjective:     Patient ID: Logan Weiss, male    DOB: 1971/03/29, 49 y.o.   MRN: [id number]  Chief Complaint  Patient presents with  . Follow-up of right groin wound    HPI: The patient is a 49 y.o. male here for follow-up of right groin wound  Patient continues to use collagen dressings daily.  He has not had to use Kerrcel AG yet.  Pain level continues to improve.  Patient denies purulent drainage, erythema or increased warmth to the area.  Patient has no complaints today.   Review of Systems  All other systems reviewed and are negative.    has a past medical history of Cellulitis (11/2017), Diabetes (HCC), and History of MRSA infection.  has a past surgical history that includes Appendectomy; right knee surgery; Knee surgery; Incision and drainage perirectal abscess (Right, 09/27/2019); and Irrigation and debridement abscess (Right, 09/29/2019).  reports that he quit smoking about 4 months ago. His smoking use included cigarettes. He smoked 0.00 packs per day for 5.00 years. He has never used smokeless tobacco. Objective:   Vital Signs BP 122/78 (BP Location: Left Arm, Patient Position: Sitting, Cuff Size: Large)   Pulse 85   Temp 98.1 F (36.7 C) (Oral)   SpO2 99%  Vital Signs and Nursing Note Reviewed Physical Exam Skin:    Comments: Right groin cephalad wound measures: 1.0 x 0.5 x 0.2 cm Right groin caudad wound measures:  1.5 x 0.5 x 0.2 cm Granulation tissue throughout the wound bed No tunneling           Assessment/Plan:     ICD-10-CM   1. 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  The wound is almost closed with 2 small open wounds present.  No signs of infection today.  I recommended the patient finish collagen dressings and start kerracel ag when done until the wound closes.   Will have patient follow-up in 2 more weeks and at that time the wounds should be closed.  Plan -Wound dressed with collagen today -Collagen daily until no more supplies then switch to kerracel ag  -Follow-up in 2 weeks   Aldean Baker, DO 02/04/2020, 9:21 AM

## 2020-02-10 ENCOUNTER — Ambulatory Visit: Payer: Self-pay | Admitting: *Deleted

## 2020-02-18 ENCOUNTER — Ambulatory Visit: Payer: Self-pay | Admitting: *Deleted

## 2020-02-18 ENCOUNTER — Ambulatory Visit: Payer: 59 | Admitting: Internal Medicine

## 2020-02-26 ENCOUNTER — Ambulatory Visit: Payer: Self-pay | Admitting: *Deleted

## 2020-02-29 ENCOUNTER — Other Ambulatory Visit: Payer: Self-pay

## 2020-02-29 ENCOUNTER — Encounter: Payer: Self-pay | Admitting: Internal Medicine

## 2020-02-29 ENCOUNTER — Ambulatory Visit (INDEPENDENT_AMBULATORY_CARE_PROVIDER_SITE_OTHER): Payer: 59 | Admitting: Internal Medicine

## 2020-02-29 VITALS — BP 150/85 | HR 66 | Temp 98.0°F

## 2020-02-29 DIAGNOSIS — S31109D Unspecified open wound of abdominal wall, unspecified quadrant without penetration into peritoneal cavity, subsequent encounter: Secondary | ICD-10-CM

## 2020-02-29 MED ORDER — OXYCODONE HCL 5 MG PO TABS: 5.0000 mg | ORAL_TABLET | Freq: Three times a day (TID) | ORAL | 0 refills | Status: AC | PRN

## 2020-02-29 NOTE — Progress Notes (Signed)
   Subjective:     Patient ID: Logan Weiss, male    DOB: August 21, 1970, 49 y.o.   MRN: [id number]  Chief Complaint  Patient presents with  . Follow-up of right groin wound    HPI: The patient is a 49 y.o. male here for follow-up of right groin wound  Patient reports using kerracel daily with dressing changes.  He noticed some bleeding a week ago to the top of the wound however could not find where the wound was opened.  The wound has now been closed for the past week.  He is continuing to be more active and reports pain after a few hours.  He states that oxycodone has been very helpful in allowing him to increase his activity.  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  Patient denies purulent drainage, erythema or increased warmth to the area.  Patient has no complaints today  Review of Systems  All other systems reviewed and are negative.    has a past medical history of Cellulitis (11/2017), Diabetes (HCC), and History of MRSA infection.  has a past surgical history that includes Appendectomy; right knee surgery; Knee surgery; Incision and drainage perirectal abscess (Right, 09/27/2019); and Irrigation and debridement abscess (Right, 09/29/2019).  reports that he quit smoking about 5 months ago. His smoking use included cigarettes. He smoked 0.00 packs per day for 5.00 years. He has never used smokeless tobacco. Objective:   Vital Signs BP (!) 150/85 (BP Location: Left Arm, Patient Position: Sitting, Cuff Size: Large)   Pulse 66   Temp 98 F (36.7 C) (Oral)   SpO2 98%  Vital Signs and Nursing Note Reviewed Physical Exam Skin:    Comments: Right groin wound has closed and epithelialized         Assessment/Plan:     ICD-10-CM   1. 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  No signs of infection.  I will do 1 more  short course of oxycodone to help with activities of daily living.  I would also like to refer to physical therapy to help with strength training prior to going to work.  Discussed with patient to follow-up with PCP as today he is being discharged from our office.  However, he was told to call us with any questions or concerns.  Plan -Physical therapy referral -Short course of oxycodone -Follow-up with PCP     Aldean Baker, DO 02/29/2020, 9:42 AM

## 2020-03-01 ENCOUNTER — Other Ambulatory Visit: Payer: Self-pay | Admitting: *Deleted

## 2020-03-01 ENCOUNTER — Encounter: Payer: Self-pay | Admitting: *Deleted

## 2020-03-01 NOTE — Patient Outreach (Signed)
[facility]) Care Management [facility] CM Telephone Outreach, new referral to assume care Case Closure  03/01/2020  Logan Weiss 04/27/1971 [id number]  Successful initial telephone outreach to [name], 49 y/o male referred to this [facility] RN CM 02/25/20 by [facility] CM leadership to assume care from previous [facility] RN CM.  Explained purpose of call to patient and introduced myself; attempted to verify HIPAA/ identity with patient, who does not wish to provide; explained federal privacy regulation for patient's protection and he eventually agrees to provide his name only.  Discussed with patient ongoing care management follow up with [facility] CM, and he reports that he no longer wishes to participate in [facility] CM program as he is doing so much better and "has everything under control;"  I offered to provide my contact information to patient as he is adamant that he is doing fine now and does not need ongoing care/ disease management assistance and does not have anything to write my number down with.  He is agreeable to my placing a letter I the mail to him with my direct contact information on it and tells me he will call me if he changes his mind and decides to resume participation in [facility] CM program; he does agree to verify his address.  Pleasant conversation; will close case per patient request  and make patient's PCP aware of same.  Caryl Pina, RN, BSN, [employer] [facility] Care Management  [phone]

## 2020-03-04 ENCOUNTER — Encounter: Payer: Self-pay | Admitting: Physical Therapy

## 2020-03-04 ENCOUNTER — Other Ambulatory Visit: Payer: Self-pay

## 2020-03-04 ENCOUNTER — Ambulatory Visit: Payer: 59 | Attending: Internal Medicine | Admitting: Physical Therapy

## 2020-03-04 DIAGNOSIS — M62831 Muscle spasm of calf: Secondary | ICD-10-CM | POA: Insufficient documentation

## 2020-03-04 DIAGNOSIS — R2689 Other abnormalities of gait and mobility: Secondary | ICD-10-CM | POA: Diagnosis present

## 2020-03-04 DIAGNOSIS — M6281 Muscle weakness (generalized): Secondary | ICD-10-CM | POA: Insufficient documentation

## 2020-03-04 NOTE — Therapy (Signed)
[facility] [address] [place], [place], [address] Phone: [phone]   Fax:  [phone]  Physical Therapy Evaluation  Patient Details  Name: Logan Weiss MRN: [id number] Date of Birth: 1971/02/11 Referring Provider (PT): Geralyn Corwin [place], DO (02/29/2020)   Encounter Date: 03/04/2020   PT End of Session - 03/04/20 0939    Visit Number 1    Number of Visits 13    Date for PT Re-Evaluation 04/15/20    Authorization Type [employer]    PT Start Time 1005   pt checked in early but ws on the phone until 5 after   PT Stop Time 1043    PT Time Calculation (min) 38 min    Activity Tolerance Patient tolerated treatment well    Behavior During Therapy [facility] for tasks assessed/performed           Past Medical History:  Diagnosis Date  . Cellulitis 11/2017   of face   . Diabetes (HCC)    new dx  . History of MRSA infection     Past Surgical History:  Procedure Laterality Date  . APPENDECTOMY    . INCISION AND DRAINAGE PERIRECTAL ABSCESS Right 09/27/2019   Procedure: IRRIGATION AND DEBRIDEMENT RIGHT THIGH, PERINEUM, AND SCROTUM;  Surgeon: Diamantina Monks, MD;  Location: [facility] OR;  Service: General;  Laterality: Right;  . IRRIGATION AND DEBRIDEMENT ABSCESS Right 09/29/2019   Procedure: IRRIGATION AND DEBRIDEMENT Right perineum and thigh;  Surgeon: Emelia Loron, MD;  Location: [facility] OR;  Service: General;  Laterality: Right;  . right knee surgery      There were no vitals filed for this visit.    Subjective Assessment - 03/04/20 1007    Subjective pt is a 49 y.o with CC of general weakness secondary to infection inthe groin that got infected with MRSA started in April and had to have debridement. pt reports the wound is healing, and reports it is completely closed.    Limitations Standing;Walking;Lifting    How long can you sit comfortably? 60 min    How long can you stand comfortably? 15 min    How long can you walk  comfortably? 15 min    Diagnostic tests 4/3/2021IMPRESSION:Significant improvement in the degree of inflammatory change in theright inguinal region and extending into the medial aspect of theright buttock. No definitive recurrent abscess is seen.    Patient Stated Goals to get stronger, to konw limitations    Currently in Pain? Yes    Pain Score 1     Pain Location Leg    Pain Orientation Right;Left;Lower    Pain Descriptors / Indicators Sore    Pain Type Chronic pain;Surgical pain    Pain Radiating Towards referred down to the calves with tightness    Pain Onset More than a month ago    Pain Frequency Intermittent    Aggravating Factors  prolonged standing/ walking, increased activity    Pain Relieving Factors sitting/ resting, stretch, pain medication    Effect of Pain on Daily Activities limited standing/ walking              [facility] PT Assessment - 03/04/20 0943      Assessment   Medical Diagnosis Right groin wound, subsequent encounter S31.109D   MD's note reports need for strengthening   Referring Provider (PT) Camelia Phenes, DO (02/29/2020)    Onset Date/Surgical Date --   3/21 and 09/28/2199 debridement   Hand Dominance Left  Next MD Visit PRN    Prior Therapy no      Precautions   Precautions None      Restrictions   Weight Bearing Restrictions No      Balance Screen   Has the patient fallen in the past 6 months No    Has the patient had a decrease in activity level because of a fear of falling?  No    Is the patient reluctant to leave their home because of a fear of falling?  No      Home Environment   Living Environment Private residence    Living Arrangements Spouse/significant other    Available Help at Discharge Family    Type of Home House    Home Access Level entry    Home Layout One level    Home Equipment None      Prior Function   Vocation Full time employment   church custodian   Vocation Requirements standing/ walking, lifting /  lowering carrying      Observation/Other Assessments   Focus on Therapeutic Outcomes (FOTO)  59% limited   predicted 35% limited     Posture/Postural Control   Posture/Postural Control Postural limitations    Postural Limitations Rounded Shoulders;Forward head      ROM / Strength   AROM / PROM / Strength AROM;Strength      AROM   AROM Assessment Site Hip;Knee    Right/Left Hip Right;Left    Right/Left Knee Right;Left      Strength   Strength Assessment Site Knee;Hip;Ankle    Right/Left Hip Right;Left    Right Hip Flexion 5/5    Right Hip Extension 4-/5    Right Hip ABduction 3+/5    Right Hip ADduction 5/5    Left Hip Flexion 5/5    Left Hip Extension 4-/5    Left Hip ABduction 4/5    Left Hip ADduction 3+/5    Right/Left Knee Right;Left    Right Knee Flexion 5/5    Right Knee Extension 5/5    Left Knee Flexion 5/5    Left Knee Extension 5/5    Right/Left Ankle Right;Left    Right Ankle Dorsiflexion 4+/5    Right Ankle Plantar Flexion 4+/5    Right Ankle Inversion 4+/5    Right Ankle Eversion 4+/5    Left Ankle Dorsiflexion 4+/5    Left Ankle Plantar Flexion 4/5    Left Ankle Inversion 4+/5    Left Ankle Eversion 4+/5      Palpation   Palpation comment Multiple trigger points noted in bil calves      Ambulation/Gait   Ambulation/Gait Yes    Gait Pattern Step-through pattern;Decreased stride length;Antalgic                      Objective measurements completed on examination: See above findings.       [facility] Adult PT Treatment/Exercise - 03/04/20 0943      Exercises   Exercises Knee/Hip;Ankle      Knee/Hip Exercises: Stretches   Passive Hamstring Stretch 1 rep;30 seconds;Left;Right      Knee/Hip Exercises: Seated   Abduction/Adduction  Strengthening;Both;1 set;10 reps   holding 3 seconds squeezing pillow   Abd/Adduction Limitations cues to avoid squeezing hard and to gradually ramp up slowly,    Sit to Sand 1 set;10 reps;without UE support    from table     Knee/Hip Exercises: Supine   Other Supine Knee/Hip Exercises marching 1 x  10 keeping core tight and cues for breathing      Knee/Hip Exercises: Sidelying   Hip ABduction Right;Strengthening;1 set;10 reps   cues to avoid posterior trunk rolling                 PT Education - 03/04/20 0938    Education Details evaluation findings, POC, goals, HEP with proper form.    Person(s) Educated Patient    Methods Explanation;Verbal cues;Handout    Comprehension Verbalized understanding;Verbal cues required            PT Short Term Goals - 03/04/20 0940      PT SHORT TERM GOAL #1   Title pt to be I with inital HEP    Time 3    Period Weeks    Status New    Target Date 03/25/20             PT Long Term Goals - 03/04/20 1131      PT LONG TERM GOAL #1   Title increase R hip strength to >/= 4+/5 to promote hip stability with prolonged standing/ walking activities    Time 6    Period Weeks    Status New    Target Date 04/15/20      PT LONG TERM GOAL #2   Title pt to be able to sit / stand and walk for >/= 60 min with </= 2/10 pain and no report of calf tightness    Time 6    Period Weeks    Status New    Target Date 04/15/20      PT LONG TERM GOAL #3   Title increase FOTO score to </= 35% limited to demo improvement in function    Time 6    Period Weeks    Status New    Target Date 04/15/20      PT LONG TERM GOAL #4   Title pt to return to work and personal exercise with no report of limitations / pain    Time 6    Period Weeks    Status New    Target Date 04/15/20      PT LONG TERM GOAL #5   Title pt to be IND with all HEP given to maintain and progress current LOF IND    Time 6    Period Weeks    Status New    Target Date 04/15/20                  Plan - 03/04/20 [id number]    Clinical Impression Statement pt presents to OPPT with CC or general weakness secondary to a MRSA infection along the R addcutors that required sugerical  debridement on 3/21 and 09/29/2019. He has functional hip/ knee ROM with weakness noted in the R hip compared bil. He would benefit from physical therapy to improve hip strength, increase endurance, and maximize overall funciton by addressing the deficits list    Personal Factors and Comorbidities Comorbidity 2    Comorbidities hx of MRSA, DM    Examination-Activity Limitations Stand;Locomotion Level    Stability/Clinical Decision Making Evolving/Moderate complexity    Clinical Decision Making Moderate    Rehab Potential Good    PT Frequency 2x / week    PT Duration 6 weeks    PT Treatment/Interventions ADLs/Self Care Home Management;Electrical Stimulation;Cryotherapy;Iontophoresis 4mg /ml Dexamethasone;Moist Heat;Ultrasound;Gait training;Therapeutic exercise;Therapeutic activities;Functional mobility training;Stair training;Balance training;Neuromuscular re-education;Manual techniques;Passive range of motion;Dry needling;Vasopneumatic Device    PT Next Visit Plan review/  update HEP PRN, review FOTO and provide handout. STW along gastroc/ soleus and stretch gross hip/ knee strnegthening, core strengthening, endurance training.    PT Home Exercise Plan [email or web address] - seated adductor stretch, standing calf stretch, seated hamstring stretch, seated adductor squeeze, sidelying hip abduction, sit to stand, supine marching.    Consulted and Agree with Plan of Care Patient           Patient will benefit from skilled therapeutic intervention in order to improve the following deficits and impairments:  Abnormal gait, Decreased strength, Increased muscle spasms, Postural dysfunction, Pain, Decreased endurance, Decreased activity tolerance  Visit Diagnosis: Muscle weakness (generalized)  Muscle spasm of calf  Other abnormalities of gait and mobility     Problem List Patient Active Problem List   Diagnosis Date Noted  . Fever 10/11/2019  . Elevated liver enzymes 10/11/2019  . Sepsis (HCC) 10/10/2019   . Hyperglycemia   . Normocytic anemia 09/30/2019  . Necrotizing soft tissue infection 09/30/2019  . Abscess of multiple sites of perineum 09/27/2019  . Type 2 diabetes mellitus with hyperglycemia (HCC) 11/05/2017  . 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 History of MRSA infection 06/08/2014    Lulu Riding PT, DPT, LAT, ATC  03/04/20  11:38 AM      [facility] [address] [place], [place], [address] Phone: [phone]   Fax:  [phone]  Name: Logan Weiss MRN: [id number] Date of Birth: 26-Jan-1971

## 2020-03-09 ENCOUNTER — Ambulatory Visit (INDEPENDENT_AMBULATORY_CARE_PROVIDER_SITE_OTHER): Payer: 59 | Admitting: Internal Medicine

## 2020-03-09 ENCOUNTER — Encounter: Payer: Self-pay | Admitting: Internal Medicine

## 2020-03-09 ENCOUNTER — Other Ambulatory Visit: Payer: Self-pay

## 2020-03-09 VITALS — BP 125/91 | HR 72 | Temp 97.9°F

## 2020-03-09 DIAGNOSIS — S31109D Unspecified open wound of abdominal wall, unspecified quadrant without penetration into peritoneal cavity, subsequent encounter: Secondary | ICD-10-CM

## 2020-03-09 NOTE — Progress Notes (Signed)
   Subjective:     Patient ID: Logan Weiss, male    DOB: March 17, 1971, 49 y.o.   MRN: [id number]  Chief Complaint  Patient presents with  . Follow-up of right groin wound    HPI: The patient is a 49 y.o. male here for follow-up of right groin wound.  Patient states that he has been more active in the past week since he is renovating his kitchen.  He continues to use carousel daily but is now developing raw areas to the previously closed wound.  He reports mild pain.  He denies increased warmth, erythema or purulent drainage.  Review of Systems  All other systems reviewed and are negative.    has a past medical history of Cellulitis (11/2017), Diabetes (HCC), and History of MRSA infection.  has a past surgical history that includes Appendectomy; right knee surgery; Incision and drainage perirectal abscess (Right, 09/27/2019); and Irrigation and debridement abscess (Right, 09/29/2019).  reports that he quit smoking about 5 months ago. His smoking use included cigarettes. He smoked 0.00 packs per day for 5.00 years. He has never used smokeless tobacco. Objective:   Vital Signs BP (!) 125/91 (BP Location: Left Arm, Patient Position: Sitting, Cuff Size: Large)   Pulse 72   Temp 97.9 F (36.6 C) (Oral)   SpO2 97%  Vital Signs and Nursing Note Reviewed Physical Exam Skin:    Comments: Right groin with scattered skin breakdown No erythema, drainage or swelling noted       Assessment/Plan:     ICD-10-CM   1. Right groin wound, subsequent encounter  S31.109D    Assessment: Skin breakdown to previously closed right groin wound  At last clinic visit patient's wound was closed and epithelialized.  Today there is some skin breakdown that appears is occurring from excess sweat and friction to the area.  I recommended continuing to use kerracel AG and adding Vaseline daily.  Once the skin breakdown has improved I recommended he use Vaseline daily regardless of an open wound.  Patient states  he would like to follow-up as needed.  Plan - kerracel AG and Vaseline daily -Follow-up as needed  Aldean Baker, DO 03/09/2020, 10:09 AM

## 2020-03-18 ENCOUNTER — Other Ambulatory Visit: Payer: Self-pay

## 2020-03-18 ENCOUNTER — Encounter: Payer: Self-pay | Admitting: Physical Therapy

## 2020-03-18 ENCOUNTER — Ambulatory Visit: Payer: 59 | Attending: Internal Medicine | Admitting: Physical Therapy

## 2020-03-18 DIAGNOSIS — R2689 Other abnormalities of gait and mobility: Secondary | ICD-10-CM | POA: Diagnosis present

## 2020-03-18 DIAGNOSIS — M62831 Muscle spasm of calf: Secondary | ICD-10-CM | POA: Insufficient documentation

## 2020-03-18 DIAGNOSIS — M6281 Muscle weakness (generalized): Secondary | ICD-10-CM | POA: Insufficient documentation

## 2020-03-18 NOTE — Patient Instructions (Signed)
Access Code: [email or web address] URL: [email or web address] Date: 03/18/2020 Prepared by: Rosana Hoes  Exercises Seated Hamstring Stretch - 2 x daily - 7 x weekly - 2 reps - 30 hold Seated Hip Adductor Stretch - 2 x daily - 7 x weekly - 2 reps - 30 hold Gastroc Stretch on Wall - 2 x daily - 7 x weekly - 2 reps - 30 hold Gastroc Stretch with Foot at Wall - 2 x daily - 7 x weekly - 2 reps - 30 hold Supine March - 1 x daily - 4-5 x weekly - 10 reps - 2 sets - 5 hold Supine Active Straight Leg Raise - 1 x daily - 4-5 x weekly - 3 sets - 5 reps Marching Bridge - 1 x daily - 4-5 x weekly - 3 sets - 5 reps Sidelying Hip Abduction - 1 x daily - 4-5 x weekly - 2 sets - 15 reps Seated Hip Adduction Squeeze with Ball - 1 x daily - 4-5 x weekly - 2 sets - 10 reps - 5 seconds hold Sit to Stand without Arm Support - 1 x daily - 4-5 x weekly - 2 sets - 15 reps Standing Heel Raise - 1 x daily - 4-5 x weekly - 2 sets - 20 reps Calf Mobilization with Small Newman Pies

## 2020-03-18 NOTE — Therapy (Signed)
[facility] [address] [place], [place], [address] Phone: [phone]   Fax:  [phone]  Physical Therapy Treatment  Patient Details  Name: Logan Weiss MRN: [id number] Date of Birth: 1970/09/17 Referring Provider (PT): Geralyn Corwin [place], DO (02/29/2020)   Encounter Date: 03/18/2020   PT End of Session - 03/18/20 0926    Visit Number 2    Number of Visits 13    Date for PT Re-Evaluation 04/15/20    Authorization Type [employer]    PT Start Time 0921    PT Stop Time 0959    PT Time Calculation (min) 38 min    Activity Tolerance Patient tolerated treatment well    Behavior During Therapy [facility] for tasks assessed/performed           Past Medical History:  Diagnosis Date   Cellulitis 11/2017   of face    Diabetes (HCC)    new dx   History of MRSA infection     Past Surgical History:  Procedure Laterality Date   APPENDECTOMY     INCISION AND DRAINAGE PERIRECTAL ABSCESS Right 09/27/2019   Procedure: IRRIGATION AND DEBRIDEMENT RIGHT THIGH, PERINEUM, AND SCROTUM;  Surgeon: Diamantina Monks, MD;  Location: [facility] OR;  Service: General;  Laterality: Right;   IRRIGATION AND DEBRIDEMENT ABSCESS Right 09/29/2019   Procedure: IRRIGATION AND DEBRIDEMENT Right perineum and thigh;  Surgeon: Emelia [place]on, MD;  [facility] OR;  Service: General;  Laterality: Right;   right knee surgery      There were no vitals filed for this visit.   Subjective Assessment - 03/18/20 0924    Subjective Patient reports he is doing well and exercises are good. States exercises were difficult at first but have gotten easier.    Patient Stated Goals To get stronger, to know limitations    Currently in Pain? Yes    Pain Score 3     Pain Location Calf    Pain Orientation Right;Left    Pain Descriptors / Indicators Tightness   "Tense pain"   Pain Type Chronic pain    Pain Onset More than a month ago    Pain Frequency Intermittent     Aggravating Factors  Activity during day                             [facility] Adult PT Treatment/Exercise - 03/18/20 0001      Exercises   Exercises Knee/Hip;Ankle      Knee/Hip Exercises: Aerobic   Nustep L6 x 5 min with LE only      Knee/Hip Exercises: Seated   Sit to Sand 2 sets;10 reps;without UE support      Knee/Hip Exercises: Supine   Bridges 2 sets;5 reps   3 seconds   Bridges Limitations 2nd set with march    Straight Leg Raises 2 sets;5 reps    Other Supine Knee/Hip Exercises Alternating marching x10      Knee/Hip Exercises: Sidelying   Hip ABduction 2 sets;10 reps      Ankle Exercises: Stretches   Slant Board Stretch 2 reps;30 seconds    Other Stretch tennis ball calf STM                  PT Education - 03/18/20 0926    Education Details HEP update    Person(s) Educated Patient    Methods Explanation;Demonstration;Verbal cues;Handout    Comprehension Verbalized understanding;Returned demonstration;Verbal  cues required;Need further instruction            PT Short Term Goals - 03/04/20 0940      PT SHORT TERM GOAL #1   Title pt to be I with inital HEP    Time 3    Period Weeks    Status New    Target Date 03/25/20             PT Long Term Goals - 03/04/20 1131      PT LONG TERM GOAL #1   Title increase R hip strength to >/= 4+/5 to promote hip stability with prolonged standing/ walking activities    Time 6    Period Weeks    Status New    Target Date 04/15/20      PT LONG TERM GOAL #2   Title pt to be able to sit / stand and walk for >/= 60 min with </= 2/10 pain and no report of calf tightness    Time 6    Period Weeks    Status New    Target Date 04/15/20      PT LONG TERM GOAL #3   Title increase FOTO score to </= 35% limited to demo improvement in function    Time 6    Period Weeks    Status New    Target Date 04/15/20      PT LONG TERM GOAL #4   Title pt to return to work and personal exercise with  no report of limitations / pain    Time 6    Period Weeks    Status New    Target Date 04/15/20      PT LONG TERM GOAL #5   Title pt to be IND with all HEP given to maintain and progress current LOF IND    Time 6    Period Weeks    Status New    Target Date 04/15/20                 Plan - 03/18/20 1749    Clinical Impression Statement Patient tolerated therapy well with no adverse effects. Continued progression of gross LE strengthening with good tolerance and incorporated using tennis ball for TPR at home. Patient did require cueing for proper technique and pacing with exercises. No increased pain reported with therapy. He would benefit from continued skilled physical therapy to improve hip strength, increase endurance, and maximize overall funciton by addressing the deficits list    PT Treatment/Interventions ADLs/Self Care Home Management;Electrical Stimulation;Cryotherapy;Iontophoresis 4mg /ml Dexamethasone;Moist Heat;Ultrasound;Gait training;Therapeutic exercise;Therapeutic activities;Functional mobility training;Stair training;Balance training;Neuromuscular re-education;Manual techniques;Passive range of motion;Dry needling;Vasopneumatic Device    PT Next Visit Plan review/ update HEP PRN, review FOTO and provide handout. STW along gastroc/ soleus and stretch gross hip/ knee strnegthening, core strengthening, endurance training.    PT Home Exercise Plan [id number] - seated adductor stretch, standing calf stretch, seated hamstring stretch, supine marching, SLR, marching bridge, sidelying hip abduction, seated adductor squeeze, sit to stand, standing heel raises, tennis ball STM    Consulted and Agree with Plan of Care Patient           Patient will benefit from skilled therapeutic intervention in order to improve the following deficits and impairments:  Abnormal gait, Decreased strength, Increased muscle spasms, Postural dysfunction, Pain, Decreased endurance, Decreased activity  tolerance  Visit Diagnosis: Muscle weakness (generalized)  Muscle spasm of calf  Other abnormalities of gait and mobility     Problem List  Patient Active Problem List   Diagnosis Date Noted   Fever 10/11/2019   Elevated liver enzymes 10/11/2019   Sepsis (HCC) 10/10/2019   Hyperglycemia    Normocytic anemia 09/30/2019   Necrotizing soft tissue infection 09/30/2019   Abscess of multiple sites of perineum 09/27/2019   Type 2 diabetes mellitus with hyperglycemia (HCC) 11/05/2017   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 [place], [place], [address] Phone: [phone]   Fax:  [phone]  Name: Logan Weiss MRN: [id number] Date of Birth: 07-Sep-1970

## 2020-03-21 ENCOUNTER — Encounter: Payer: Self-pay | Admitting: Physical Therapy

## 2020-03-21 ENCOUNTER — Ambulatory Visit: Payer: 59 | Admitting: Physical Therapy

## 2020-03-21 ENCOUNTER — Other Ambulatory Visit: Payer: Self-pay

## 2020-03-21 DIAGNOSIS — M6281 Muscle weakness (generalized): Secondary | ICD-10-CM

## 2020-03-21 DIAGNOSIS — R2689 Other abnormalities of gait and mobility: Secondary | ICD-10-CM

## 2020-03-21 DIAGNOSIS — M62831 Muscle spasm of calf: Secondary | ICD-10-CM

## 2020-03-21 NOTE — Therapy (Signed)
[facility] Outpatient Rehabilitation [facility] [address] [place], [place], [address] Phone: [phone]   Fax:  [phone]  Physical Therapy Treatment  Patient Details  Name: Logan Weiss MRN: [id number] Date of Birth: 1971/01/16 Referring Provider (PT): Geralyn Corwin [place], DO (02/29/2020)   Encounter Date: 03/21/2020   PT End of Session - 03/21/20 1017    Visit Number 3    Number of Visits 13    Date for PT Re-Evaluation 04/15/20    Authorization Type [employer]    PT Start Time [phone]    PT Stop Time 1011    PT Time Calculation (min) 40 min    Activity Tolerance Patient tolerated treatment well    Behavior During Therapy [facility] for tasks assessed/performed           Past Medical History:  Diagnosis Date   Cellulitis 11/2017   of face    Diabetes (HCC)    new dx   History of MRSA infection     Past Surgical History:  Procedure Laterality Date   APPENDECTOMY     INCISION AND DRAINAGE PERIRECTAL ABSCESS Right 09/27/2019   Procedure: IRRIGATION AND DEBRIDEMENT RIGHT THIGH, PERINEUM, AND SCROTUM;  Surgeon: Diamantina Monks, MD;  Location: [facility] OR;  Service: General;  Laterality: Right;   IRRIGATION AND DEBRIDEMENT ABSCESS Right 09/29/2019   Procedure: IRRIGATION AND DEBRIDEMENT Right perineum and thigh;  Surgeon: Emelia [place]on, MD;  Location: [facility] OR;  Service: General;  Laterality: Right;   right knee surgery      There were no vitals filed for this visit.   Subjective Assessment - 03/21/20 0941    Subjective "Still with standing/ waking for longer periods    Patient Stated Goals To get stronger, to know limitations    Currently in Pain? Yes    Pain Score 2     Pain Orientation Right;Left    Pain Descriptors / Indicators Tightness    Aggravating Factors  prolonged standing/ walking,    Pain Relieving Factors stretching medication              [facility] PT Assessment - 03/21/20 0001      Assessment   Medical Diagnosis Right  groin wound, subsequent encounter S31.109D    Referring Provider (PT) Camelia Phenes, DO (02/29/2020)                         [facility] Adult PT Treatment/Exercise - 03/21/20 0942      Knee/Hip Exercises: Stretches   Piriformis Stretch 2 reps;Left;Right;30 seconds      Knee/Hip Exercises: Aerobic   Elliptical L5 x 5 min ramp L1      Knee/Hip Exercises: Standing   Heel Raises 2 sets;20 reps   on airex pad   Hip Abduction 2 sets;15 reps;Knee straight    Abduction Limitations with red theraband    Hip Extension 2 sets;Both;Stengthening;15 reps;Knee straight    Extension Limitations with red theraband    Functional Squat 2 sets;10 reps   with bil HHA for      Ankle Exercises: Stretches   Slant Board Stretch 3 reps;30 seconds   gastroc/ soleus              Balance Exercises - 03/21/20 0001      Balance Exercises: Standing   Standing Eyes Opened Narrow base of support (BOS);30 secs;1 rep;Solid surface    Standing Eyes Closed Narrow base of support (BOS);2 reps;30 secs;Solid surface  Other Standing Exercises rhomberg balance with UE holding black physioball 2 x ABC             PT Education - 03/21/20 1015    Education Details Reviewed FOTO assessment and provided handout.    Person(s) Educated Patient    Methods Explanation;Handout    Comprehension Verbalized understanding            PT Short Term Goals - 03/04/20 0940      PT SHORT TERM GOAL #1   Title pt to be I with inital HEP    Time 3    Period Weeks    Status New    Target Date 03/25/20             PT Long Term Goals - 03/04/20 1131      PT LONG TERM GOAL #1   Title increase R hip strength to >/= 4+/5 to promote hip stability with prolonged standing/ walking activities    Time 6    Period Weeks    Status New    Target Date 04/15/20      PT LONG TERM GOAL #2   Title pt to be able to sit / stand and walk for >/= 60 min with </= 2/10 pain and no report of calf tightness     Time 6    Period Weeks    Status New    Target Date 04/15/20      PT LONG TERM GOAL #3   Title increase FOTO score to </= 35% limited to demo improvement in function    Time 6    Period Weeks    Status New    Target Date 04/15/20      PT LONG TERM GOAL #4   Title pt to return to work and personal exercise with no report of limitations / pain    Time 6    Period Weeks    Status New    Target Date 04/15/20      PT LONG TERM GOAL #5   Title pt to be IND with all HEP given to maintain and progress current LOF IND    Time 6    Period Weeks    Status New    Target Date 04/15/20                 Plan - 03/21/20 1013    Clinical Impression Statement pt reports consistency with his HEP and reports he feels he is getting better. continued working on bil LE strengthening which he did well with but does fatigue quickly. He demonstrates difficulty with balance demonstrating increased postural sway with tandem positioning requairing CGA for safety.    PT Treatment/Interventions ADLs/Self Care Home Management;Electrical Stimulation;Cryotherapy;Iontophoresis 4mg /ml Dexamethasone;Moist Heat;Ultrasound;Gait training;Therapeutic exercise;Therapeutic activities;Functional mobility training;Stair training;Balance training;Neuromuscular re-education;Manual techniques;Passive range of motion;Dry needling;Vasopneumatic Device    PT Next Visit Plan review/ update HEP PRN, along gastroc/ soleus and stretch gross hip/ knee strnegthening, core strengthening, endurance training.    PT Home Exercise Plan [id number] - seated adductor stretch, standing calf stretch, seated hamstring stretch, supine marching, SLR, marching bridge, sidelying hip abduction, seated adductor squeeze, sit to stand, standing heel raises, tennis ball STM    Consulted and Agree with Plan of Care Patient           Patient will benefit from skilled therapeutic intervention in order to improve the following deficits and impairments:   Abnormal gait, Decreased strength, Increased muscle spasms, Postural dysfunction, Pain, Decreased endurance,  Decreased activity tolerance  Visit Diagnosis: Muscle weakness (generalized)  Muscle spasm of calf  Other abnormalities of gait and mobility     Problem List Patient Active Problem List   Diagnosis Date Noted   Fever 10/11/2019   Elevated liver enzymes 10/11/2019   Sepsis (HCC) 10/10/2019   Hyperglycemia    Normocytic anemia 09/30/2019   Necrotizing soft tissue infection 09/30/2019   Abscess of multiple sites of perineum 09/27/2019   Type 2 diabetes mellitus with hyperglycemia (HCC) 11/05/2017   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 [address] [place], [place], [address] Phone: [phone]   Fax:  [phone]  Name: Logan Weiss MRN: [id number] Date of Birth: 1970-09-20

## 2020-03-23 ENCOUNTER — Other Ambulatory Visit: Payer: Self-pay

## 2020-03-23 ENCOUNTER — Ambulatory Visit: Payer: 59 | Admitting: Physical Therapy

## 2020-03-23 ENCOUNTER — Encounter: Payer: Self-pay | Admitting: Physical Therapy

## 2020-03-23 DIAGNOSIS — M6281 Muscle weakness (generalized): Secondary | ICD-10-CM | POA: Diagnosis not present

## 2020-03-23 DIAGNOSIS — R2689 Other abnormalities of gait and mobility: Secondary | ICD-10-CM

## 2020-03-23 DIAGNOSIS — M62831 Muscle spasm of calf: Secondary | ICD-10-CM

## 2020-03-23 NOTE — Therapy (Signed)
[facility] Outpatient Rehabilitation [facility] [address] [place], [place], [address] Phone: [phone]   Fax:  [phone]  Physical Therapy Treatment  Patient Details  Name: Logan Weiss MRN: [id number] Date of Birth: 23-Mar-1971 Referring Provider (PT): Geralyn Corwin [place], DO (02/29/2020)   Encounter Date: 03/23/2020   PT End of Session - 03/23/20 0807    Visit Number 4    Number of Visits 13    Date for PT Re-Evaluation 04/15/20    Authorization Type [employer]    PT Start Time 0803    PT Stop Time 0845    PT Time Calculation (min) 42 min    Activity Tolerance Patient tolerated treatment well    Behavior During Therapy [facility] for tasks assessed/performed           Past Medical History:  Diagnosis Date  . Cellulitis 11/2017   of face   . Diabetes (HCC)    new dx  . History of MRSA infection     Past Surgical History:  Procedure Laterality Date  . APPENDECTOMY    . INCISION AND DRAINAGE PERIRECTAL ABSCESS Right 09/27/2019   Procedure: IRRIGATION AND DEBRIDEMENT RIGHT THIGH, PERINEUM, AND SCROTUM;  Surgeon: Diamantina Monks, MD;  Location: [facility] OR;  Service: General;  Laterality: Right;  . IRRIGATION AND DEBRIDEMENT ABSCESS Right 09/29/2019   Procedure: IRRIGATION AND DEBRIDEMENT Right perineum and thigh;  Surgeon: Emelia [place], MD;  Location: [facility] OR;  Service: General;  Laterality: Right;  . right knee surgery      There were no vitals filed for this visit.   Subjective Assessment - 03/23/20 0805    Subjective Its a litttle sore htis morning. Overall it is doing pretty well. He reported a litle soreness after the last visit.    Limitations Standing;Walking;Lifting    Diagnostic tests 4/3/2021IMPRESSION:Significant improvement in the degree of inflammatory change in theright inguinal region and extending into the medial aspect of theright buttock. No definitive recurrent abscess is seen.    Patient Stated Goals To get stronger, to know  limitations    Currently in Pain? Yes    Pain Score 2     Pain Location Groin    Pain Orientation Right    Pain Descriptors / Indicators Aching    Pain Type Chronic pain    Pain Onset More than a month ago    Pain Frequency Intermittent    Aggravating Factors  prolonged standing and walking    Pain Relieving Factors stretching and medication    Effect of Pain on Daily Activities limits standing and walking                             [facility] Adult PT Treatment/Exercise - 03/23/20 0001      High Level Balance   High Level Balance Comments tandem stsance eyes cloised 1 min each leg; narrow base eyes closed 2x 1 min; step onto air-ex 2x10 right.       Knee/Hip Exercises: Stretches   Piriformis Stretch 2 reps;Left;Right;30 seconds      Knee/Hip Exercises: Standing   Heel Raises 2 sets;20 reps   on airex pad   Hip Abduction 2 sets;15 reps;Knee straight    Abduction Limitations with red theraband    Hip Extension 2 sets;Both;Stengthening;15 reps;Knee straight    Extension Limitations with red theraband    Functional Squat 2 sets;10 reps   with bil HHA for    Other  Standing Knee Exercises standing slow march 2x10       Knee/Hip Exercises: Seated   Long Arc Quad Limitations x15 red bilateral       Knee/Hip Exercises: Supine   Bridges 2 sets;10 reps    Straight Leg Raises 2 sets;10 reps      Ankle Exercises: Stretches   Slant Board Stretch 3 reps;30 seconds   gastroc/ soleus                 PT Education - 03/23/20 0806    Education Details reviewed technique with HEP    Person(s) Educated Patient    Methods Tactile cues;Demonstration;Explanation;Verbal cues    Comprehension Verbalized understanding;Returned demonstration;Verbal cues required;Tactile cues required            PT Short Term Goals - 03/04/20 0940      PT SHORT TERM GOAL #1   Title pt to be I with inital HEP    Time 3    Period Weeks    Status New    Target Date 03/25/20              PT Long Term Goals - 03/04/20 1131      PT LONG TERM GOAL #1   Title increase R hip strength to >/= 4+/5 to promote hip stability with prolonged standing/ walking activities    Time 6    Period Weeks    Status New    Target Date 04/15/20      PT LONG TERM GOAL #2   Title pt to be able to sit / stand and walk for >/= 60 min with </= 2/10 pain and no report of calf tightness    Time 6    Period Weeks    Status New    Target Date 04/15/20      PT LONG TERM GOAL #3   Title increase FOTO score to </= 35% limited to demo improvement in function    Time 6    Period Weeks    Status New    Target Date 04/15/20      PT LONG TERM GOAL #4   Title pt to return to work and personal exercise with no report of limitations / pain    Time 6    Period Weeks    Status New    Target Date 04/15/20      PT LONG TERM GOAL #5   Title pt to be IND with all HEP given to maintain and progress current LOF IND    Time 6    Period Weeks    Status New    Target Date 04/15/20                 Plan - 03/23/20 5009    Clinical Impression Statement Patient tolerated treatment well. He had a mild increadse in pain with ther-ex but overall he did well. Therapy increased his reps with mat work. He was given a seated circuit to work on at home. Therapy will continue to progress as tolerated. Patient worked on balance ans stability exercises. He was able to tolerate and had improved stability with practice    Personal Factors and Comorbidities Comorbidity 2    Comorbidities hx of MRSA, DM    Examination-Activity Limitations Stand;Locomotion Level    Stability/Clinical Decision Making Evolving/Moderate complexity    Clinical Decision Making Moderate    Rehab Potential Good    PT Frequency 2x / week    PT Duration 6  weeks    PT Treatment/Interventions ADLs/Self Care Home Management;Electrical Stimulation;Cryotherapy;Iontophoresis 4mg /ml Dexamethasone;Moist Heat;Ultrasound;Gait  training;Therapeutic exercise;Therapeutic activities;Functional mobility training;Stair training;Balance training;Neuromuscular re-education;Manual techniques;Passive range of motion;Dry needling;Vasopneumatic Device    PT Next Visit Plan review/ update HEP PRN, along gastroc/ soleus and stretch gross hip/ knee strnegthening, core strengthening, endurance training. continue to progress as tolerated    PT Home Exercise Plan [id number] - seated adductor stretch, standing calf stretch, seated hamstring stretch, supine marching, SLR, marching bridge, sidelying hip abduction, seated adductor squeeze, sit to stand, standing heel raises, tennis ball STM    Consulted and Agree with Plan of Care Patient           Patient will benefit from skilled therapeutic intervention in order to improve the following deficits and impairments:  Abnormal gait, Decreased strength, Increased muscle spasms, Postural dysfunction, Pain, Decreased endurance, Decreased activity tolerance  Visit Diagnosis: Muscle weakness (generalized)  Muscle spasm of calf  Other abnormalities of gait and mobility     Problem List Patient Active Problem List   Diagnosis Date Noted  . Fever 10/11/2019  . Elevated liver enzymes 10/11/2019  . Sepsis (HCC) 10/10/2019  . Hyperglycemia   . Normocytic anemia 09/30/2019  . Necrotizing soft tissue infection 09/30/2019  . Abscess of multiple sites of perineum 09/27/2019  . Type 2 diabetes mellitus with hyperglycemia (HCC) 11/05/2017  . 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 History of MRSA infection 06/08/2014    14/07/2013  PT DPT  03/23/2020, 8:55 AM  [facility] [address] [place], [place], [place] Phone: [phone]   Fax:  [phone]  Name: EMON LANCE MRN: [name] Date of Birth: [birth year]

## 2020-03-28 ENCOUNTER — Encounter: Payer: Self-pay | Admitting: Physical Therapy

## 2020-03-28 ENCOUNTER — Other Ambulatory Visit: Payer: Self-pay

## 2020-03-28 ENCOUNTER — Ambulatory Visit: Payer: 59 | Admitting: Physical Therapy

## 2020-03-28 DIAGNOSIS — M62831 Muscle spasm of calf: Secondary | ICD-10-CM

## 2020-03-28 DIAGNOSIS — M6281 Muscle weakness (generalized): Secondary | ICD-10-CM

## 2020-03-28 DIAGNOSIS — R2689 Other abnormalities of gait and mobility: Secondary | ICD-10-CM

## 2020-03-28 NOTE — Therapy (Signed)
[facility] Outpatient Rehabilitation [facility] [address] [place], [place], [address] Phone: [phone]   Fax:  [phone]  Physical Therapy Treatment  Patient Details  Name: Logan Weiss MRN: [id number] Date of Birth: 1971/01/12 Referring Provider (PT): Geralyn Corwin Newton, DO (02/29/2020)   Encounter Date: 03/28/2020   PT End of Session - 03/28/20 1003    Visit Number 5    Number of Visits 13    Date for PT Re-Evaluation 04/15/20    Authorization Type [employer]    PT Start Time 1000    PT Stop Time 1040    PT Time Calculation (min) 40 min    Activity Tolerance Patient tolerated treatment well    Behavior During Therapy [facility] for tasks assessed/performed           Past Medical History:  Diagnosis Date  . Cellulitis 11/2017   of face   . Diabetes (HCC)    new dx  . History of MRSA infection     Past Surgical History:  Procedure Laterality Date  . APPENDECTOMY    . INCISION AND DRAINAGE PERIRECTAL ABSCESS Right 09/27/2019   Procedure: IRRIGATION AND DEBRIDEMENT RIGHT THIGH, PERINEUM, AND SCROTUM;  Surgeon: Diamantina Monks, MD;  Location: [facility] OR;  Service: General;  Laterality: Right;  . IRRIGATION AND DEBRIDEMENT ABSCESS Right 09/29/2019   Procedure: IRRIGATION AND DEBRIDEMENT Right perineum and thigh;  Surgeon: Emelia Loron, MD;  Location: [facility] OR;  Service: General;  Laterality: Right;  . right knee surgery      There were no vitals filed for this visit.   Subjective Assessment - 03/28/20 1001    Subjective Patient reported right hip started hurting for a little bit following exercises performed on Saturday. He does report the exercises are helping and he is feeling better.    Patient Stated Goals To get stronger, to know limitations    Currently in Pain? No/denies              [facility] PT Assessment - 03/28/20 0001      Strength   Right Hip Extension 4/5    Right Hip ABduction 4-/5    Left Hip Extension 4/5    Left Hip  ABduction 4/5                         [facility] Adult PT Treatment/Exercise - 03/28/20 0001      Neuro Re-ed    Neuro Re-ed Details  Tandem stance with head turns 3 x 1 min each      Exercises   Exercises Knee/Hip;Ankle      Knee/Hip Exercises: Aerobic   Recumbent Bike L3 x 5 min      Knee/Hip Exercises: Standing   Other Standing Knee Exercises Lateral band walk with red around knees 3 x 20 each      Knee/Hip Exercises: Seated   Long Arc Quad 2 sets;10 reps    Long Arc Quad Limitations red band loop    Hamstring Curl 2 sets;10 reps    Hamstring Limitations red band loop    Sit to [employer] 2 sets;10 reps      Knee/Hip Exercises: Supine   Bridges 2 sets;10 reps    Bridges Limitations 2nd set with march    Straight Leg Raises 2 sets;10 reps      Ankle Exercises: Musician Stretch 3 reps;30 seconds      Ankle Exercises: Standing   Heel Raises  20 reps   2 sets                 PT Education - 03/28/20 1003    Education Details HEP    Person(s) Educated Patient    Methods Explanation    Comprehension Verbalized understanding;Need further instruction            PT Short Term Goals - 03/28/20 1043      PT SHORT TERM GOAL #1   Title pt to be I with inital HEP    Time 3    Period Weeks    Status Achieved    Target Date 03/25/20             PT Long Term Goals - 03/04/20 1131      PT LONG TERM GOAL #1   Title increase R hip strength to >/= 4+/5 to promote hip stability with prolonged standing/ walking activities    Time 6    Period Weeks    Status New    Target Date 04/15/20      PT LONG TERM GOAL #2   Title pt to be able to sit / stand and walk for >/= 60 min with </= 2/10 pain and no report of calf tightness    Time 6    Period Weeks    Status New    Target Date 04/15/20      PT LONG TERM GOAL #3   Title increase FOTO score to </= 35% limited to demo improvement in function    Time 6    Period Weeks    Status New     Target Date 04/15/20      PT LONG TERM GOAL #4   Title pt to return to work and personal exercise with no report of limitations / pain    Time 6    Period Weeks    Status New    Target Date 04/15/20      PT LONG TERM GOAL #5   Title pt to be IND with all HEP given to maintain and progress current LOF IND    Time 6    Period Weeks    Status New    Target Date 04/15/20                 Plan - 03/28/20 1004    Clinical Impression Statement Patient tolerated therapy well with no adverse effects. Continued therapy focus on gross flexibility and strengthening to improve activity tolerance and reduce pain. Progressed HEP and included balance as patient continues to report feeling off balance. He did not report any increase in pain this visit. He would benefit from continued skilled physical therapy to improve hip strength, increase endurance, and maximize overall funciton by addressing the deficits list    PT Treatment/Interventions ADLs/Self Care Home Management;Electrical Stimulation;Cryotherapy;Iontophoresis 4mg /ml Dexamethasone;Moist Heat;Ultrasound;Gait training;Therapeutic exercise;Therapeutic activities;Functional mobility training;Stair training;Balance training;Neuromuscular re-education;Manual techniques;Passive range of motion;Dry needling;Vasopneumatic Device    PT Next Visit Plan Reassess FOTO for 6th visit, review HEP and PRN, continue gross LE and core strengthening (machines, step-ups, etc), balance progression, endurance training    PT Home Exercise Plan [id number] - seated adductor stretch, seated hamstring stretch, standing calf stretch, SLR, marching bridge, seated adductor pillow squeeze, LAQ with red, seated hamstring curl with red, sit to stand, standing heel raises, lateral band walk with red, tandem stance with head turns, tennis ball STM to calves    Consulted and Agree with Plan of Care Patient  Patient will benefit from skilled therapeutic intervention  in order to improve the following deficits and impairments:  Abnormal gait, Decreased strength, Increased muscle spasms, Postural dysfunction, Pain, Decreased endurance, Decreased activity tolerance  Visit Diagnosis: Muscle weakness (generalized)  Muscle spasm of calf  Other abnormalities of gait and mobility     Problem List Patient Active Problem List   Diagnosis Date Noted  . Fever 10/11/2019  . Elevated liver enzymes 10/11/2019  . Sepsis (HCC) 10/10/2019  . Hyperglycemia   . Normocytic anemia 09/30/2019  . Necrotizing soft tissue infection 09/30/2019  . Abscess of multiple sites of perineum 09/27/2019  . Type 2 diabetes mellitus with hyperglycemia (HCC) 11/05/2017  . 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 History of MRSA infection 06/08/2014    Rosana Hoes, PT, DPT, LAT, ATC 03/28/20  10:46 AM Phone: [phone] Fax: [phone]   [facility] Outpatient Rehabilitation [facility] [address] [place], [place], [address] Phone: [phone]   Fax:  [phone]  Name: Logan Weiss MRN: [id number] Date of Birth: 08/08/1970

## 2020-03-30 ENCOUNTER — Other Ambulatory Visit: Payer: Self-pay

## 2020-03-30 ENCOUNTER — Ambulatory Visit: Payer: 59 | Admitting: Physical Therapy

## 2020-03-30 ENCOUNTER — Encounter: Payer: Self-pay | Admitting: Physical Therapy

## 2020-03-30 DIAGNOSIS — M6281 Muscle weakness (generalized): Secondary | ICD-10-CM | POA: Diagnosis not present

## 2020-03-30 DIAGNOSIS — M62831 Muscle spasm of calf: Secondary | ICD-10-CM

## 2020-03-30 DIAGNOSIS — R2689 Other abnormalities of gait and mobility: Secondary | ICD-10-CM

## 2020-03-30 NOTE — Therapy (Signed)
[facility] Outpatient Rehabilitation [facility] [address] [place], [place], [address] Phone: [phone]   Fax:  [phone]  Physical Therapy Treatment  Patient Details  Name: Logan Weiss MRN: [id number] Date of Birth: 1970/09/01 Referring Provider (PT): Geralyn Corwin [place], DO (02/29/2020)   Encounter Date: 03/30/2020   PT End of Session - 03/30/20 0924    Visit Number 6    Number of Visits 13    Date for PT Re-Evaluation 04/15/20    Authorization Type [employer]    PT Start Time 0900   Patient was 15 minutes late   PT Stop Time 0930    PT Time Calculation (min) 30 min    Activity Tolerance Patient tolerated treatment well    Behavior During Therapy [facility] for tasks assessed/performed           Past Medical History:  Diagnosis Date  . Cellulitis 11/2017   of face   . Diabetes (HCC)    new dx  . History of MRSA infection     Past Surgical History:  Procedure Laterality Date  . APPENDECTOMY    . INCISION AND DRAINAGE PERIRECTAL ABSCESS Right 09/27/2019   Procedure: IRRIGATION AND DEBRIDEMENT RIGHT THIGH, PERINEUM, AND SCROTUM;  Surgeon: Diamantina Monks, MD;  Location: [facility] OR;  Service: General;  Laterality: Right;  . IRRIGATION AND DEBRIDEMENT ABSCESS Right 09/29/2019   Procedure: IRRIGATION AND DEBRIDEMENT Right perineum and thigh;  Surgeon: Emelia Loron, MD;  Location: [facility] OR;  Service: General;  Laterality: Right;  . right knee surgery      There were no vitals filed for this visit.   Subjective Assessment - 03/30/20 0902    Subjective Patient reports the wound has been hjurting a little more. It is still tolerable. No deep pain. he was advised to continure to monitor.    Limitations Standing;Walking;Lifting    How long can you sit comfortably? 60 min    How long can you stand comfortably? 15 min    How long can you walk comfortably? 15 min    Patient Stated Goals To get stronger, to know limitations    Currently in Pain? Yes     Pain Score 2     Pain Location Groin    Pain Orientation Right    Pain Descriptors / Indicators Aching    Pain Type Chronic pain    Pain Radiating Towards tightness into the calves at times    Pain Onset More than a month ago    Pain Frequency Intermittent    Aggravating Factors  prolinged standing and walking    Pain Relieving Factors stretching and medication    Effect of Pain on Daily Activities limits standing and walking              [facility] PT Assessment - 03/30/20 0001      Observation/Other Assessments   Focus on Therapeutic Outcomes (FOTO)  Patient was late                          [facility] Adult PT Treatment/Exercise - 03/30/20 0001      Knee/Hip Exercises: Standing   Lateral Step Up 1 set;15 reps;Step Height: 6"    Forward Step Up 1 set;15 reps;Step Height: 6"    Functional Squat Limitations x20     Other Standing Knee Exercises Lateral band walk with red around knees 3 x 20 each      Knee/Hip Exercises: Seated  Long Arc [employer] 2 sets;10 reps    [employer] Limitations green     Hamstring Curl 2 sets;10 reps    Hamstring Limitations green     Abd/Adduction Limitations clamshell 2x10 green     Sit to [employer] 2 sets;10 reps      Knee/Hip Exercises: Supine   Bridges 2 sets;10 reps    Bridges Limitations 2nd set with march    Straight Leg Raises 2 sets;10 reps      Ankle Exercises: Stretches   Slant Board Stretch 3 reps;30 seconds                  PT Education - 03/30/20 [phone]    Education Details reviewed HEP and symptom mangement    Person(s) Educated Patient    Methods Demonstration;Tactile cues;Verbal cues            PT Short Term Goals - 03/28/20 1043      PT SHORT TERM GOAL #1   Title pt to be I with inital HEP    Time 3    Period Weeks    Status Achieved    Target Date 03/25/20             PT Long Term Goals - 03/04/20 1131      PT LONG TERM GOAL #1   Title increase R hip strength to >/= 4+/5 to promote hip  stability with prolonged standing/ walking activities    Time 6    Period Weeks    Status New    Target Date 04/15/20      PT LONG TERM GOAL #2   Title pt to be able to sit / stand and walk for >/= 60 min with </= 2/10 pain and no report of calf tightness    Time 6    Period Weeks    Status New    Target Date 04/15/20      PT LONG TERM GOAL #3   Title increase FOTO score to </= 35% limited to demo improvement in function    Time 6    Period Weeks    Status New    Target Date 04/15/20      PT LONG TERM GOAL #4   Title pt to return to work and personal exercise with no report of limitations / pain    Time 6    Period Weeks    Status New    Target Date 04/15/20      PT LONG TERM GOAL #5   Title pt to be IND with all HEP given to maintain and progress current LOF IND    Time 6    Period Weeks    Status New    Target Date 04/15/20                 Plan - 03/30/20 1257    Clinical Impression Statement Patient continues to tolerate ther-ex well. Therapy added step ups, lateral steps, leg press, and squats. He had no increase in pain. His tightness in his calfs has improved. He did have pain around his scar but it did not change with ther-ex. Therapy will continue to progress as tolerated.    Examination-Activity Limitations Stand;Locomotion Level    Stability/Clinical Decision Making Evolving/Moderate complexity    Clinical Decision Making Moderate    Rehab Potential Good    PT Frequency 2x / week    PT Duration 6 weeks    PT Treatment/Interventions ADLs/Self Care Home Management;Electrical  Stimulation;Cryotherapy;Iontophoresis 4mg /ml Dexamethasone;Moist Heat;Ultrasound;Gait training;Therapeutic exercise;Therapeutic activities;Functional mobility training;Stair training;Balance training;Neuromuscular re-education;Manual techniques;Passive range of motion;Dry needling;Vasopneumatic Device    PT Next Visit Plan Reassess FOTO for 6th visit, review HEP and PRN, continue gross  LE and core strengthening (machines, step-ups, etc), balance progression, endurance training; do FOTO!    PT Home Exercise Plan [id number] - seated adductor stretch, seated hamstring stretch, standing calf stretch, SLR, marching bridge, seated adductor pillow squeeze, LAQ with red, seated hamstring curl with red, sit to stand, standing heel raises, lateral band walk with red, tandem stance with head turns, tennis ball STM to calves           Patient will benefit from skilled therapeutic intervention in order to improve the following deficits and impairments:  Abnormal gait, Decreased strength, Increased muscle spasms, Postural dysfunction, Pain, Decreased endurance, Decreased activity tolerance  Visit Diagnosis: Muscle weakness (generalized)  Muscle spasm of calf  Other abnormalities of gait and mobility     Problem List Patient Active Problem List   Diagnosis Date Noted  . Fever 10/11/2019  . Elevated liver enzymes 10/11/2019  . Sepsis (HCC) 10/10/2019  . Hyperglycemia   . Normocytic anemia 09/30/2019  . Necrotizing soft tissue infection 09/30/2019  . Abscess of multiple sites of perineum 09/27/2019  . Type 2 diabetes mellitus with hyperglycemia (HCC) 11/05/2017  . 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 History of MRSA infection 06/08/2014    14/07/2013 PT DPT  03/30/2020, 1:03 PM  [facility] [address] [place], [place], [place] Phone: [phone]   Fax:  [phone]  Name: KELLIN BARTLING MRN: [name] Date of Birth: Jul 16, 1970

## 2020-04-01 ENCOUNTER — Telehealth: Payer: Self-pay | Admitting: Internal Medicine

## 2020-04-01 NOTE — Telephone Encounter (Signed)
Patient called to say that the wound is completely closed and he is on his third week of therapy and the area around the wound is starting hurt. It looks normal, but patient wants to make sure it's normal to experience pain. Please call patient to advise.

## 2020-04-04 NOTE — Telephone Encounter (Signed)
Called patient regarding the message below.  He stated that he's during better, but he has noticed some pain around the wound area 3 weeks after during the exercises and stretching PT wanted him to do.    He stated he asked at PT if the pain was normal and they informed him that it was.  But he wanted to let Dr. Lou Miner know and make sure it was normal.  I informed the patient that Dr. Mikey Bussing was out of the office and will not return until next week.  Asked him if I could get back with him next week after informing Dr. Mikey Bussing of this message.  He stated that would be fine.//AB/CMA

## 2020-04-05 ENCOUNTER — Encounter: Payer: Self-pay | Admitting: Physical Therapy

## 2020-04-05 ENCOUNTER — Other Ambulatory Visit: Payer: Self-pay

## 2020-04-05 ENCOUNTER — Ambulatory Visit: Payer: 59 | Admitting: Physical Therapy

## 2020-04-05 DIAGNOSIS — M6281 Muscle weakness (generalized): Secondary | ICD-10-CM

## 2020-04-05 DIAGNOSIS — R2689 Other abnormalities of gait and mobility: Secondary | ICD-10-CM

## 2020-04-05 DIAGNOSIS — M62831 Muscle spasm of calf: Secondary | ICD-10-CM

## 2020-04-05 NOTE — Therapy (Signed)
[facility] Outpatient Rehabilitation [facility] [address] [place], [place], [address] Phone: [phone]   Fax:  [phone]  Physical Therapy Treatment  Patient Details  Name: Logan Weiss MRN: [id number] Date of Birth: 1971-03-01 Referring Provider (PT): Geralyn Corwin [place], DO (02/29/2020)   Encounter Date: 04/05/2020   PT End of Session - 04/05/20 1348    Visit Number 7    Number of Visits 13    Date for PT Re-Evaluation 04/15/20    Authorization Type [employer]    PT Start Time [phone]   Patient 15 minutes late   PT Stop Time 1015    PT Time Calculation (min) 39 min    Activity Tolerance Patient tolerated treatment well    Behavior During Therapy [facility] for tasks assessed/performed           Past Medical History:  Diagnosis Date  . Cellulitis 11/2017   of face   . Diabetes (HCC)    new dx  . History of MRSA infection     Past Surgical History:  Procedure Laterality Date  . APPENDECTOMY    . INCISION AND DRAINAGE PERIRECTAL ABSCESS Right 09/27/2019   Procedure: IRRIGATION AND DEBRIDEMENT RIGHT THIGH, PERINEUM, AND SCROTUM;  Surgeon: Diamantina Monks, MD;  Location: [facility] OR;  Service: General;  Laterality: Right;  . IRRIGATION AND DEBRIDEMENT ABSCESS Right 09/29/2019   Procedure: IRRIGATION AND DEBRIDEMENT Right perineum and thigh;  Surgeon: Emelia Loron, MD;  Location: [facility] OR;  Service: General;  Laterality: Right;  . right knee surgery      There were no vitals filed for this visit.   Subjective Assessment - 04/05/20 0942    Subjective Patient took a break over the weekedn and bhis pain decreased. He is feeling good today. he isn;t having pain in the scar today.    Limitations Standing;Walking;Lifting    How long can you sit comfortably? 60 min    How long can you stand comfortably? 15 min    How long can you walk comfortably? 15 min    Diagnostic tests 4/3/2021IMPRESSION:Significant improvement in the degree of inflammatory change in  theright inguinal region and extending into the medial aspect of theright buttock. No definitive recurrent abscess is seen.    Patient Stated Goals To get stronger, to know limitations    Currently in Pain? No/denies    Pain Score 2     Pain Orientation Left    Pain Descriptors / Indicators Aching    Pain Type Chronic pain    Pain Onset More than a month ago    Pain Frequency Intermittent    Aggravating Factors  prolonged walking    Pain Relieving Factors Stretching and medication    Effect of Pain on Daily Activities limits standing and walking                             [facility] Adult PT Treatment/Exercise - 04/05/20 0001      Knee/Hip Exercises: Aerobic   Recumbent Bike L3 x 5 min      Knee/Hip Exercises: Machines for Strengthening   Cybex Leg Press 2x15 60 lbs       Knee/Hip Exercises: Standing   Heel Raises 2 sets;20 reps    Lateral Step Up 1 set;15 reps;Step Height: 6";20 reps;10 reps    Forward Step Up 1 set;Step Height: 6";20 reps    Functional Squat Limitations x20     Other Standing Knee  Exercises Lateral band walk with red around knees 3 x 20 each    Other Standing Knee Exercises kettle bell swing x20       Knee/Hip Exercises: Seated   Long Arc Quad 2 sets;10 reps    Long Arc Quad Limitations 3lb    Hamstring Curl 2 sets;10 reps    Hamstring Limitations green     Abd/Adduction Limitations clamshell 2x10 green       Knee/Hip Exercises: Supine   Bridges 2 sets;10 reps    Bridges Limitations 2nd set with march    Straight Leg Raises 2 sets;10 reps      Ankle Exercises: Standing   Heel Raises 20 reps   2 sets                 PT Education - 04/05/20 0939    Education Details HEP and symptom mangement    Person(s) Educated Patient    Methods Explanation;Demonstration;Tactile cues;Verbal cues    Comprehension Verbalized understanding;Returned demonstration;Verbal cues required;Tactile cues required            PT Short Term Goals -  03/28/20 1043      PT SHORT TERM GOAL #1   Title pt to be I with inital HEP    Time 3    Period Weeks    Status Achieved    Target Date 03/25/20             PT Long Term Goals - 03/04/20 1131      PT LONG TERM GOAL #1   Title increase R hip strength to >/= 4+/5 to promote hip stability with prolonged standing/ walking activities    Time 6    Period Weeks    Status New    Target Date 04/15/20      PT LONG TERM GOAL #2   Title pt to be able to sit / stand and walk for >/= 60 min with </= 2/10 pain and no report of calf tightness    Time 6    Period Weeks    Status New    Target Date 04/15/20      PT LONG TERM GOAL #3   Title increase FOTO score to </= 35% limited to demo improvement in function    Time 6    Period Weeks    Status New    Target Date 04/15/20      PT LONG TERM GOAL #4   Title pt to return to work and personal exercise with no report of limitations / pain    Time 6    Period Weeks    Status New    Target Date 04/15/20      PT LONG TERM GOAL #5   Title pt to be IND with all HEP given to maintain and progress current LOF IND    Time 6    Period Weeks    Status New    Target Date 04/15/20                 Plan - 04/05/20 0944    Clinical Impression Statement Patient is making great progress. He had some fatigue with training but nop pain. Therapy will continue to advanceas tolerabel. Consider D/C next visit if he is doing well.    Personal Factors and Comorbidities Comorbidity 2    Comorbidities hx of MRSA, DM    Examination-Activity Limitations Stand;Locomotion Level    Stability/Clinical Decision Making Evolving/Moderate complexity    Clinical Decision Making  Moderate    Rehab Potential Good    PT Frequency 2x / week    PT Duration 6 weeks    PT Treatment/Interventions ADLs/Self Care Home Management;Electrical Stimulation;Cryotherapy;Iontophoresis 4mg /ml Dexamethasone;Moist Heat;Ultrasound;Gait training;Therapeutic exercise;Therapeutic  activities;Functional mobility training;Stair training;Balance training;Neuromuscular re-education;Manual techniques;Passive range of motion;Dry needling;Vasopneumatic Device    PT Next Visit Plan Reassess FOTO for 6th visit, review HEP and PRN, continue gross LE and core strengthening (machines, step-ups, etc), balance progression, endurance training; do FOTO!    PT Home Exercise Plan [id number] - seated adductor stretch, seated hamstring stretch, standing calf stretch, SLR, marching bridge, seated adductor pillow squeeze, LAQ with red, seated hamstring curl with red, sit to stand, standing heel raises, lateral band walk with red, tandem stance with head turns, tennis ball STM to calves    Consulted and Agree with Plan of Care Patient           Patient will benefit from skilled therapeutic intervention in order to improve the following deficits and impairments:  Abnormal gait, Decreased strength, Increased muscle spasms, Postural dysfunction, Pain, Decreased endurance, Decreased activity tolerance  Visit Diagnosis: Muscle weakness (generalized)  Muscle spasm of calf  Other abnormalities of gait and mobility     Problem List Patient Active Problem List   Diagnosis Date Noted  . Fever 10/11/2019  . Elevated liver enzymes 10/11/2019  . Sepsis (HCC) 10/10/2019  . Hyperglycemia   . Normocytic anemia 09/30/2019  . Necrotizing soft tissue infection 09/30/2019  . Abscess of multiple sites of perineum 09/27/2019  . Type 2 diabetes mellitus with hyperglycemia (HCC) 11/05/2017  . 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 History of MRSA infection 06/08/2014    14/07/2013 PT DPT  04/05/2020, 1:58 PM  [facility] [address] [place], [place], [place] Phone: [phone]   Fax:  [phone]  Name: Logan Weiss MRN: [name] Date of Birth: [birth year]

## 2020-04-13 NOTE — Telephone Encounter (Addendum)
Called and spoke with the patient and informed him that I spoke with Dr. Mikey Bussing regarding his message, and she stated that it is normal.    She stated that since his wound is healed and he feels he needs something for pain he will need to reach out to his PCP or General Surgeon.   Patient verbalized understanding and agreed.//AB/CMA

## 2020-04-14 ENCOUNTER — Encounter: Payer: Self-pay | Admitting: Physical Therapy

## 2020-04-14 ENCOUNTER — Other Ambulatory Visit: Payer: Self-pay

## 2020-04-14 ENCOUNTER — Ambulatory Visit: Payer: 59 | Attending: Internal Medicine | Admitting: Physical Therapy

## 2020-04-14 DIAGNOSIS — M62831 Muscle spasm of calf: Secondary | ICD-10-CM | POA: Diagnosis present

## 2020-04-14 DIAGNOSIS — R2689 Other abnormalities of gait and mobility: Secondary | ICD-10-CM | POA: Diagnosis present

## 2020-04-14 DIAGNOSIS — M6281 Muscle weakness (generalized): Secondary | ICD-10-CM | POA: Insufficient documentation

## 2020-04-14 NOTE — Patient Instructions (Signed)
Access Code: [email or web address] URL: [email or web address] Date: 04/14/2020 Prepared by: Rosana Hoes  Exercises Supine Quadriceps Stretch with Strap on Table - 2 x daily - 7 x weekly - 2 reps - 30 hold Seated Hamstring Stretch - 2 x daily - 7 x weekly - 2 reps - 30 hold Seated Hip Adductor Stretch - 2 x daily - 7 x weekly - 2 reps - 30 hold Gastroc Stretch on Wall - 2 x daily - 7 x weekly - 2 reps - 30 hold Gastroc Stretch with Foot at Wall - 2 x daily - 7 x weekly - 2 reps - 30 hold Supine Active Straight Leg Raise - 1 x daily - 4 x weekly - 3 sets - 10 reps Marching Bridge - 1 x daily - 4 x weekly - 3 sets - 10 reps Seated Knee Extension with Resistance - 1 x daily - 4 x weekly - 2 sets - 10 reps Seated Hamstring Curls with Resistance - 1 x daily - 4 x weekly - 2 sets - 10 reps Goblet Squat with Kettlebell - 1 x daily - 4 x weekly - 3 sets - 10 reps Single Leg Heel Raise with Chair Support - 1 x daily - 4 x weekly - 3 sets - 15 reps Side Stepping with Resistance at Thighs - 1 x daily - 4 x weekly - 3 sets - 20 reps Tandem Stance with Head Rotation - 7 x weekly - 3 sets - 1 minutes hold Single Leg Stance - 1 x daily - 7 x weekly - 3 sets - 1 minutes hold Calf Mobilization with Small Newman Pies

## 2020-04-14 NOTE — Therapy (Signed)
[facility] Outpatient Rehabilitation [facility] [address] [place], [place], [address] Phone: [phone]   Fax:  [phone]  Physical Therapy Treatment / ERO   Progress Note Reporting Period 03/04/2020 to 04/14/2020  See note below for Objective Data and Assessment of Progress/Goals.    Patient Details  Name: Logan Weiss MRN: [id number] Date of Birth: 1970-09-06 Referring Provider (PT): Geralyn Corwin Riggins, DO (02/29/2020)   Encounter Date: 04/14/2020   PT End of Session - 04/14/20 0912    Visit Number 8    Number of Visits 14    Date for PT Re-Evaluation 05/26/20    Authorization Type [employer]    PT Start Time 0915    PT Stop Time 1000    PT Time Calculation (min) 45 min    Activity Tolerance Patient tolerated treatment well    Behavior During Therapy [facility] for tasks assessed/performed           Past Medical History:  Diagnosis Date  . Cellulitis 11/2017   of face   . Diabetes (HCC)    new dx  . History of MRSA infection     Past Surgical History:  Procedure Laterality Date  . APPENDECTOMY    . INCISION AND DRAINAGE PERIRECTAL ABSCESS Right 09/27/2019   Procedure: IRRIGATION AND DEBRIDEMENT RIGHT THIGH, PERINEUM, AND SCROTUM;  Surgeon: Diamantina Monks, MD;  Location: [facility] OR;  Service: General;  Laterality: Right;  . IRRIGATION AND DEBRIDEMENT ABSCESS Right 09/29/2019   Procedure: IRRIGATION AND DEBRIDEMENT Right perineum and thigh;  Surgeon: Emelia Loron, MD;  Location: [facility];  Service: General;  Laterality: Right;  . right knee surgery      There were no vitals filed for this visit.   Subjective Assessment - 04/14/20 0915    Subjective Patient notes he went to [place] for a convention so he was doing more than usual, a lot of walking, so his calves were hurting a little more. The stretching seems to alleviate the tightness a little. His calves are still hurting a little today. He continues to feel unabalanced and notes he has  fallen while putting on socks due to balance. 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    Limitations Standing;Walking;Lifting    How long can you sit comfortably? 4 hours    How long can you stand comfortably? 15 minutes    How long can you walk comfortably? 15 minutes    Patient Stated Goals To get stronger, to know limitations    Currently in Pain? Yes    Pain Score 2     Pain Location Calf    Pain Orientation Right;Left    Pain Descriptors / Indicators Tightness    Pain Type Chronic pain    Pain Onset More than a month ago    Pain Frequency Intermittent    Aggravating Factors  Walking    Pain Relieving Factors Stretching    Effect of Pain on Daily Activities Walking              [facility] PT Assessment - 04/14/20 0001      Assessment   Medical Diagnosis Right groin wound, subsequent encounter S31.109D    Referring Provider (PT) Camelia Phenes, DO (02/29/2020)    Onset Date/Surgical Date --   3/21 and 09/28/2199 debridement   Hand Dominance Left    Prior Therapy None      Precautions   Precautions None  Restrictions   Weight Bearing Restrictions No      Balance Screen   Has the patient fallen in the past 6 months Yes    Has the patient had a decrease in activity level because of a fear of falling?  No    Is the patient reluctant to leave their home because of a fear of falling?  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    Leisure None reported      Cognition   Overall Cognitive Status Within Functional Limits for tasks assessed      Observation/Other Assessments   Observations Patient appears in no apparent distress    Focus on Therapeutic Outcomes (FOTO)  38% limitation      Sensation   Light Touch Appears Intact      Coordination   Gross Motor Movements are Fluid and  Coordinated Yes      Functional Tests   Functional tests Single leg stance      Single Leg Stance   Comments < 5 seconds on right      Posture/Postural Control   Posture Comments Patient demonstrated slouched seated posture      AROM   Overall AROM Comments Lumbar, hip, knee, and ankle AROM grossly WFL and non-painful      Strength   Right Hip Flexion 5/5    Right Hip Extension 4/5    Right Hip ABduction 4-/5    Left Hip Flexion 5/5    Left Hip Extension 4/5    Left Hip ABduction 4/5    Right Knee Flexion 5/5    Right Knee Extension 5/5    Left Knee Flexion 5/5    Left Knee Extension 5/5    Right Ankle Dorsiflexion 5/5    Right Ankle Plantar Flexion 4+/5    Right Ankle Inversion 5/5    Right Ankle Eversion 5/5    Left Ankle Dorsiflexion 5/5    Left Ankle Plantar Flexion 4+/5    Left Ankle Inversion 5/5    Left Ankle Eversion 5/5      Flexibility   Soft Tissue Assessment /Muscle Length yes    Hamstrings Limited bilaterally    Quadriceps Hip flexors limited bilaterally      Transfers   Transfers Independent with all Transfers                         [facility] Adult PT Treatment/Exercise - 04/14/20 0001      Self-Care   Self-Care Other Self-Care Comments    Other Self-Care Comments  Exam findings, unclear etiology of calf tightness but likely deconditioning, FOTO, HEP update, walking program      Neuro Re-ed    Neuro Re-ed Details  SLS x 1 minute      Exercises   Exercises Knee/Hip;Ankle   reviewed all current HEP     Knee/Hip Exercises: Stretches   Hip Flexor Stretch 2 reps;30 seconds    Hip Flexor Stretch Limitations supine edge of table with strap      Knee/Hip Exercises: Aerobic   Recumbent Bike L3 x 5 min      Knee/Hip Exercises: Standing   Functional Squat Limitations Goblet squat to table with 20# 2x10    Other Standing Knee Exercises Lateral band walk with green around knees 2 x 20 each  Knee/Hip Exercises: Supine   Bridges 2  sets;10 reps    Bridges Limitations marching    Straight Leg Raises 10 reps      Ankle Exercises: Stretches   Gastroc Stretch 3 reps;30 seconds    Gastroc Stretch Limitations standing      Ankle Exercises: Standing   Heel Raises 15 reps   2 sets   Heel Raises Limitations single leg                  PT Education - 04/14/20 0912    Education Details Exam findings, unclear etiology of calf tightness but likely deconditioning, FOTO, HEP update, walking program    Person(s) Educated Patient    Methods Explanation;Verbal cues;Handout;Demonstration    Comprehension Verbalized understanding;Need further instruction;Returned demonstration;Verbal cues required            PT Short Term Goals - 03/28/20 1043      PT SHORT TERM GOAL #1   Title pt to be I with inital HEP    Time 3    Period Weeks    Status Achieved    Target Date 03/25/20             PT Long Term Goals - 04/14/20 0930      PT LONG TERM GOAL #1   Title increase R hip strength to >/= 4+/5 to promote hip stability with prolonged standing/ walking activities    Baseline Right hip strength grossly between 4-/5 to 4/5 MMT    Time 6    Period Weeks    Status On-going    Target Date 05/26/20      PT LONG TERM GOAL #2   Title pt to be able to sit / stand and walk for >/= 60 min with </= 2/10 pain and no report of calf tightness    Baseline Patient reports ability to sit 4 hours, only able to walk 15-20 minutes    Time 6    Period Weeks    Status On-going    Target Date 05/26/20      PT LONG TERM GOAL #3   Title increase FOTO score to </= 35% limited to demo improvement in function    Baseline 38% limitation    Time 6    Period Weeks    Status On-going    Target Date 05/26/20      PT LONG TERM GOAL #4   Title pt to return to work and personal exercise with no report of limitations / pain    Baseline Patient reports unable to return to work lifting due to weakness and inbalance    Time 6    Period  Weeks    Status On-going    Target Date 05/26/20      PT LONG TERM GOAL #5   Title pt to be IND with all HEP given to maintain and progress current LOF IND    Baseline HEP updated    Time 6    Period Weeks    Status On-going    Target Date 05/26/20                 Plan - 04/14/20 0913    Clinical Impression Statement Patient tolerated therapy well without adverse effects. He is making progress toward his goals but continues to exhibit deficits mainly in his strength, walking/standing tolerance, and balance. 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 The etiology of his bilateral calf tightness is unclear as his symptoms began following surgery to hip/groin, but it is likely due to deconditioning vs. claudication as his symptoms occur with increased activity and improve with rest. He would benefit from continued skilled PT to progress his strength, balance, and activity tolerance in order to return to work and maximize functional level.    Rehab Potential Good    PT Frequency 1x / week    PT Duration 6 weeks    PT Treatment/Interventions ADLs/Self Care Home Management;Electrical Stimulation;Cryotherapy;Iontophoresis 4mg /ml Dexamethasone;Moist Heat;Ultrasound;Gait training;Therapeutic exercise;Therapeutic activities;Functional mobility training;Stair training;Balance training;Neuromuscular re-education;Manual techniques;Passive range of motion;Dry needling;Vasopneumatic Device;Patient/family education    PT Next Visit Plan Focus mainly on strengthening consisting of lifting, pushing (sled), pulling to prepare patient to return to work, balance progressions    PT Home Exercise Plan [email or web address] - supine hip flexor/quad stretch with strap, seated adductor stretch, seated hamstring stretch, standing calf stretch, SLR, marching bridge, LAQ with red, seated hamstring curl with red,  goblet squat with 20#, standing SL heel raises, lateral band walk with green, tandem stance with head turns, SL stance, tennis ball STM to calves    Consulted and Agree with Plan of Care Patient           Patient will benefit from skilled therapeutic intervention in order to improve the following deficits and impairments:  Abnormal gait, Decreased strength, Increased muscle spasms, Postural dysfunction, Pain, Decreased endurance, Decreased activity tolerance, Decreased balance  Visit Diagnosis: Muscle weakness (generalized)  Muscle spasm of calf  Other abnormalities of gait and mobility     Problem List Patient Active Problem List   Diagnosis Date Noted  . Fever 10/11/2019  . Elevated liver enzymes 10/11/2019  . Sepsis (HCC) 10/10/2019  . Hyperglycemia   . Normocytic anemia 09/30/2019  . Necrotizing soft tissue infection 09/30/2019  . Abscess of multiple sites of perineum 09/27/2019  . Type 2 diabetes mellitus with hyperglycemia (HCC) 11/05/2017  . 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 History of MRSA infection 06/08/2014    14/07/2013, PT, DPT, LAT, ATC 04/14/20  10:23 AM Phone: [phone] Fax: [phone]   [facility] Outpatient Rehabilitation [facility] [address] [place], [place], [place] Phone: [phone]   Fax:  [phone]  Name: RASHED EDLER MRN: [name] Date of Birth: [birth year]

## 2020-04-29 ENCOUNTER — Encounter: Payer: Self-pay | Admitting: Physical Therapy

## 2020-04-29 ENCOUNTER — Ambulatory Visit: Payer: 59 | Admitting: Physical Therapy

## 2020-04-29 ENCOUNTER — Other Ambulatory Visit: Payer: Self-pay

## 2020-04-29 DIAGNOSIS — M6281 Muscle weakness (generalized): Secondary | ICD-10-CM

## 2020-04-29 DIAGNOSIS — R2689 Other abnormalities of gait and mobility: Secondary | ICD-10-CM

## 2020-04-29 DIAGNOSIS — M62831 Muscle spasm of calf: Secondary | ICD-10-CM

## 2020-04-29 NOTE — Therapy (Signed)
[facility] Outpatient Rehabilitation [facility] [address] [place], [place], [address] Phone: [phone]   Fax:  [phone]  Physical Therapy Treatment  Patient Details  Name: Logan Weiss MRN: [id number] Date of Birth: 07-15-1970 Referring Provider (PT): Geralyn Corwin [place], DO (02/29/2020)   Encounter Date: 04/29/2020   PT End of Session - 04/29/20 0750    Visit Number 9    Number of Visits 14    Date for PT Re-Evaluation 05/26/20    Authorization Type [employer]    PT Start Time 0745    PT Stop Time 0827    PT Time Calculation (min) 42 min    Activity Tolerance Patient tolerated treatment well    Behavior During Therapy [facility] for tasks assessed/performed           Past Medical History:  Diagnosis Date  . Cellulitis 11/2017   of face   . Diabetes (HCC)    new dx  . History of MRSA infection     Past Surgical History:  Procedure Laterality Date  . APPENDECTOMY    . INCISION AND DRAINAGE PERIRECTAL ABSCESS Right 09/27/2019   Procedure: IRRIGATION AND DEBRIDEMENT RIGHT THIGH, PERINEUM, AND SCROTUM;  Surgeon: Diamantina Monks, MD;  Location: [facility] OR;  Service: General;  Laterality: Right;  . IRRIGATION AND DEBRIDEMENT ABSCESS Right 09/29/2019   Procedure: IRRIGATION AND DEBRIDEMENT Right perineum and thigh;  Surgeon: Emelia Loron, MD;  Location: [facility] OR;  Service: General;  Laterality: Right;  . right knee surgery      There were no vitals filed for this visit.   Subjective Assessment - 04/29/20 0746    Subjective Patient reports he is doing well, no new issues. He is consistent working on exercises and they are getting a little bit easier. Patient reports he tried to go back to work and when he tried to push some furniture he had some pain in his hip area.    Patient Stated Goals To get stronger, to know limitations    Currently in Pain? Yes    Pain Score 2     Pain Location Calf    Pain Orientation Right;Left    Pain Descriptors /  Indicators Tightness    Pain Type Chronic pain    Pain Onset More than a month ago    Pain Frequency Intermittent              [facility] PT Assessment - 04/29/20 0001      Strength   Right Hip ABduction 4/5    Left Hip ABduction 4/5                         [facility] Adult PT Treatment/Exercise - 04/29/20 0001      Knee/Hip Exercises: Machines for Strengthening   Cybex Leg Press 120# 4 x 8      Knee/Hip Exercises: Standing   Forward Step Up 2 sets;10 reps    Forward Step Up Limitations 12" step, hold bilateral 10# kettlebells    Walking with Sports Cord Lateral band walk with blue below knees 3 x 20 each    Other Standing Knee Exercises Dead lift with 75# 4 x 5   barbell   Other Standing Knee Exercises Sled push-pull 34ft 80# x 4 each      Ankle Exercises: Standing   Heel Raises 15 reps   3 sets   Heel Raises Limitations SL edge of step  PT Education - 04/29/20 0750    Education Details HEP, keeping core engaged with all tasks and using hips to perform heavy tasks    Person(s) Educated Patient    Methods Explanation;Demonstration;Verbal cues    Comprehension Verbalized understanding;Returned demonstration;Verbal cues required;Need further instruction            PT Short Term Goals - 03/28/20 1043      PT SHORT TERM GOAL #1   Title pt to be I with inital HEP    Time 3    Period Weeks    Status Achieved    Target Date 03/25/20             PT Long Term Goals - 04/14/20 0930      PT LONG TERM GOAL #1   Title increase R hip strength to >/= 4+/5 to promote hip stability with prolonged standing/ walking activities    Baseline Right hip strength grossly between 4-/5 to 4/5 MMT    Time 6    Period Weeks    Status On-going    Target Date 05/26/20      PT LONG TERM GOAL #2   Title pt to be able to sit / stand and walk for >/= 60 min with </= 2/10 pain and no report of calf tightness    Baseline Patient reports ability to sit 4  hours, only able to walk 15-20 minutes    Time 6    Period Weeks    Status On-going    Target Date 05/26/20      PT LONG TERM GOAL #3   Title increase FOTO score to </= 35% limited to demo improvement in function    Baseline 38% limitation    Time 6    Period Weeks    Status On-going    Target Date 05/26/20      PT LONG TERM GOAL #4   Title pt to return to work and personal exercise with no report of limitations / pain    Baseline Patient reports unable to return to work lifting due to weakness and inbalance    Time 6    Period Weeks    Status On-going    Target Date 05/26/20      PT LONG TERM GOAL #5   Title pt to be IND with all HEP given to maintain and progress current LOF IND    Baseline HEP updated    Time 6    Period Weeks    Status On-going    Target Date 05/26/20                 Plan - 04/29/20 0752    Clinical Impression Statement Patient tolerated therapy well without adverse effects. Therapy focused on progressing strength with tasks similar to what he would be challenged with at work such as lifting, pulling, pushing heavy objects. He did not report any increase in calf tightness or hip pain this visit. No change to HEP this visit but will consider progress next visit. He would benefit from continued skilled PT to progress his strength, balance, and activity tolerance in order to return to work and maximize functional level.    PT Treatment/Interventions ADLs/Self Care Home Management;Electrical Stimulation;Cryotherapy;Iontophoresis 4mg /ml Dexamethasone;Moist Heat;Ultrasound;Gait training;Therapeutic exercise;Therapeutic activities;Functional mobility training;Stair training;Balance training;Neuromuscular re-education;Manual techniques;Passive range of motion;Dry needling;Vasopneumatic Device;Patient/family education    PT Next Visit Plan Focus mainly on strengthening consisting of lifting, pushing (sled), pulling to prepare patient to return to work, balance  progressions  PT Home Exercise Plan [email or web address] - supine hip flexor/quad stretch with strap, seated adductor stretch, seated hamstring stretch, standing calf stretch, SLR, marching bridge, LAQ with red, seated hamstring curl with red, goblet squat with 20#, standing SL heel raises, lateral band walk with green, tandem stance with head turns, SL stance, tennis ball STM to calves    Consulted and Agree with Plan of Care Patient           Patient will benefit from skilled therapeutic intervention in order to improve the following deficits and impairments:  Abnormal gait, Decreased strength, Increased muscle spasms, Postural dysfunction, Pain, Decreased endurance, Decreased activity tolerance, Decreased balance  Visit Diagnosis: Muscle weakness (generalized)  Muscle spasm of calf  Other abnormalities of gait and mobility     Problem List Patient Active Problem List   Diagnosis Date Noted  . Fever 10/11/2019  . Elevated liver enzymes 10/11/2019  . Sepsis (HCC) 10/10/2019  . Hyperglycemia   . Normocytic anemia 09/30/2019  . Necrotizing soft tissue infection 09/30/2019  . Abscess of multiple sites of perineum 09/27/2019  . Type 2 diabetes mellitus with hyperglycemia (HCC) 11/05/2017  . 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 History of MRSA infection 06/08/2014    Rosana Hoes, PT, DPT, LAT, ATC 04/29/20  8:34 AM Phone: [phone] Fax: [phone]   [facility] Outpatient Rehabilitation [facility] [address] [place], [place], [address] Phone: [phone]   Fax:  [phone]  Name: Logan Weiss MRN: [id number] Date of Birth: 02/03/1971

## 2020-05-02 ENCOUNTER — Encounter: Payer: Self-pay | Admitting: Physical Therapy

## 2020-05-02 ENCOUNTER — Ambulatory Visit: Payer: 59 | Admitting: Physical Therapy

## 2020-05-02 ENCOUNTER — Other Ambulatory Visit: Payer: Self-pay

## 2020-05-02 DIAGNOSIS — R2689 Other abnormalities of gait and mobility: Secondary | ICD-10-CM

## 2020-05-02 DIAGNOSIS — M62831 Muscle spasm of calf: Secondary | ICD-10-CM

## 2020-05-02 DIAGNOSIS — M6281 Muscle weakness (generalized): Secondary | ICD-10-CM

## 2020-05-02 NOTE — Therapy (Signed)
[facility] [address] [place], [place], [address] Phone: [phone]   Fax:  [phone]  Physical Therapy Treatment  Patient Details  Name: Logan Weiss MRN: [id number] Date of Birth: 1970/12/07 Referring Provider (PT): Geralyn Corwin [place], DO (02/29/2020)   Encounter Date: 05/02/2020   PT End of Session - 05/02/20 0835    Visit Number 10    Number of Visits 14    Date for PT Re-Evaluation 05/26/20    Authorization Type [employer]    PT Start Time 0830    PT Stop Time 0910    PT Time Calculation (min) 40 min    Activity Tolerance Patient tolerated treatment well    Behavior During Therapy [facility] for tasks assessed/performed           Past Medical History:  Diagnosis Date  . Cellulitis 11/2017   of face   . Diabetes (HCC)    new dx  . History of MRSA infection     Past Surgical History:  Procedure Laterality Date  . APPENDECTOMY    . INCISION AND DRAINAGE PERIRECTAL ABSCESS Right 09/27/2019   Procedure: IRRIGATION AND DEBRIDEMENT RIGHT THIGH, PERINEUM, AND SCROTUM;  Surgeon: Diamantina Monks, MD;  Location: [facility] OR;  Service: General;  Laterality: Right;  . IRRIGATION AND DEBRIDEMENT ABSCESS Right 09/29/2019   Procedure: IRRIGATION AND DEBRIDEMENT Right perineum and thigh;  Surgeon: Emelia Loron, MD;  Location: [facility] OR;  Service: General;  Laterality: Right;  . right knee surgery      There were no vitals filed for this visit.   Subjective Assessment - 05/02/20 0833    Subjective Patient reports he is doing good. He was sore following last visit.    Patient Stated Goals To get stronger, to know limitations    Currently in Pain? No/denies              [facility] PT Assessment - 05/02/20 0001      Single Leg Stance   Comments ~ 10 seconds bilaterally                         [facility] Adult PT Treatment/Exercise - 05/02/20 0001      Exercises   Exercises Knee/Hip;Ankle      Knee/Hip  Exercises: Aerobic   Recumbent Bike L3 x 5 min      Knee/Hip Exercises: Machines for Strengthening   Cybex Leg Press 130# 4 x 8      Knee/Hip Exercises: Standing   Forward Step Up 2 sets;10 reps    Forward Step Up Limitations runner step-up 8" step, hold bilateral 10# kettlebells    Walking with Sports Cord Lateral band walk with blue at mid-shin 3 x 20 each    Other Standing Knee Exercises Dead lift with 75# 4 x 6    Other Standing Knee Exercises Sled push-pull 65ft 100# x 4 each      Ankle Exercises: Standing   Heel Raises 15 reps   3 sets   Heel Raises Limitations SL edge of step    Other Standing Ankle Exercises FM cable row 13# bilaterally 3 x 10                  PT Education - 05/02/20 0835    Education Details HEP, walking progression    Person(s) Educated Patient    Methods Explanation;Demonstration;Verbal cues    Comprehension Verbalized understanding;Returned demonstration;Verbal cues required;Need further instruction  PT Short Term Goals - 03/28/20 1043      PT SHORT TERM GOAL #1   Title pt to be I with inital HEP    Time 3    Period Weeks    Status Achieved    Target Date 03/25/20             PT Long Term Goals - 04/14/20 0930      PT LONG TERM GOAL #1   Title increase R hip strength to >/= 4+/5 to promote hip stability with prolonged standing/ walking activities    Baseline Right hip strength grossly between 4-/5 to 4/5 MMT    Time 6    Period Weeks    Status On-going    Target Date 05/26/20      PT LONG TERM GOAL #2   Title pt to be able to sit / stand and walk for >/= 60 min with </= 2/10 pain and no report of calf tightness    Baseline Patient reports ability to sit 4 hours, only able to walk 15-20 minutes    Time 6    Period Weeks    Status On-going    Target Date 05/26/20      PT LONG TERM GOAL #3   Title increase FOTO score to </= 35% limited to demo improvement in function    Baseline 38% limitation    Time 6     Period Weeks    Status On-going    Target Date 05/26/20      PT LONG TERM GOAL #4   Title pt to return to work and personal exercise with no report of limitations / pain    Baseline Patient reports unable to return to work lifting due to weakness and inbalance    Time 6    Period Weeks    Status On-going    Target Date 05/26/20      PT LONG TERM GOAL #5   Title pt to be IND with all HEP given to maintain and progress current LOF IND    Baseline HEP updated    Time 6    Period Weeks    Status On-going    Target Date 05/26/20                 Plan - 05/02/20 0836    Clinical Impression Statement Patient tolerated therapy well without adverse effects. Continued focus on strengthening with job related tasks. Patient continued to exhibit gross endurance deficit with exercises and reports difficulty with balance tasks. No pain reported with therapy this visit. Patient was encouraged to continue progressing his walking. He would benefit from continued skilled PT to progress his strength, balance, and activity tolerance in order to return to work and maximize functional level.    PT Treatment/Interventions ADLs/Self Care Home Management;Electrical Stimulation;Cryotherapy;Iontophoresis 4mg /ml Dexamethasone;Moist Heat;Ultrasound;Gait training;Therapeutic exercise;Therapeutic activities;Functional mobility training;Stair training;Balance training;Neuromuscular re-education;Manual techniques;Passive range of motion;Dry needling;Vasopneumatic Device;Patient/family education    PT Next Visit Plan Focus mainly on strengthening consisting of lifting, pushing (sled), pulling to prepare patient to return to work, balance progressions    PT Home Exercise Plan [id number] - supine hip flexor/quad stretch with strap, seated adductor stretch, seated hamstring stretch, standing calf stretch, SLR, marching bridge, LAQ with red, seated hamstring curl with red, goblet squat with 20#, standing SL heel raises,  lateral band walk with green, tandem stance with head turns, SL stance, tennis ball STM to calves    Consulted and Agree with Plan of Care  Patient           Patient will benefit from skilled therapeutic intervention in order to improve the following deficits and impairments:  Abnormal gait, Decreased strength, Increased muscle spasms, Postural dysfunction, Pain, Decreased endurance, Decreased activity tolerance, Decreased balance  Visit Diagnosis: Muscle weakness (generalized)  Muscle spasm of calf  Other abnormalities of gait and mobility     Problem List Patient Active Problem List   Diagnosis Date Noted  . Fever 10/11/2019  . Elevated liver enzymes 10/11/2019  . Sepsis (HCC) 10/10/2019  . Hyperglycemia   . Normocytic anemia 09/30/2019  . Necrotizing soft tissue infection 09/30/2019  . Abscess of multiple sites of perineum 09/27/2019  . Type 2 diabetes mellitus with hyperglycemia (HCC) 11/05/2017  . 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 History of MRSA infection 06/08/2014    Rosana Hoes, PT, DPT, LAT, ATC 05/02/20  9:15 AM Phone: [phone] Fax: [phone]   [facility] [address] [place], [place], [address] Phone: [phone]   Fax:  [phone]  Name: Logan Weiss MRN: [id number] Date of Birth: Jul 26, 1970

## 2020-05-05 ENCOUNTER — Telehealth: Payer: Self-pay | Admitting: Internal Medicine

## 2020-05-05 NOTE — Telephone Encounter (Signed)
Patient called to advise that he still has some red spots on his wound and he wanted to know how to address those areas to help them heal. Please call him to advise. [phone]

## 2020-05-09 ENCOUNTER — Ambulatory Visit: Payer: 59 | Attending: Internal Medicine | Admitting: Physical Therapy

## 2020-05-09 ENCOUNTER — Encounter: Payer: Self-pay | Admitting: Physical Therapy

## 2020-05-09 ENCOUNTER — Telehealth: Payer: Self-pay

## 2020-05-09 ENCOUNTER — Other Ambulatory Visit: Payer: Self-pay

## 2020-05-09 DIAGNOSIS — R2689 Other abnormalities of gait and mobility: Secondary | ICD-10-CM | POA: Diagnosis present

## 2020-05-09 DIAGNOSIS — M6281 Muscle weakness (generalized): Secondary | ICD-10-CM | POA: Diagnosis not present

## 2020-05-09 DIAGNOSIS — M62831 Muscle spasm of calf: Secondary | ICD-10-CM | POA: Diagnosis present

## 2020-05-09 NOTE — Therapy (Signed)
[place], [place], [address] Phone: [phone]   Fax:  [phone]  Physical Therapy Treatment / Discharge  Patient Details  Name: Logan Weiss MRN: [id number] Date of Birth: [birth year] Referring Provider (PT): Kalman Shan [place], DO (02/29/2020)   Encounter Date: 05/09/2020   PT End of Session - 05/09/20 0803    Visit Number 11    Number of Visits 14    Date for PT Re-Evaluation 05/26/20    Authorization Type [employer]    PT Start Time 0802    PT Stop Time 0840    PT Time Calculation (min) 38 min    Activity Tolerance Patient tolerated treatment well    Behavior During Therapy [facility] for tasks assessed/performed           Past Medical History:  Diagnosis Date  . Cellulitis 11/2017   of face   . Diabetes ([place])    new dx  . History of MRSA infection     Past Surgical History:  Procedure Laterality Date  . APPENDECTOMY    . INCISION AND DRAINAGE PERIRECTAL ABSCESS Right 09/27/2019   Procedure: IRRIGATION AND DEBRIDEMENT RIGHT THIGH, PERINEUM, AND SCROTUM;  Surgeon: Jesusita Oka, MD;  Location: [place];  Service: General;  Laterality: Right;  . IRRIGATION AND DEBRIDEMENT ABSCESS Right 09/29/2019   Procedure: IRRIGATION AND DEBRIDEMENT Right perineum and thigh;  Surgeon: Rolm Bookbinder, MD;  Location: [place];  Service: General;  Laterality: Right;  . right knee surgery      There were no vitals filed for this visit.   Subjective Assessment - 05/09/20 0804    Subjective "I returned to work last week,    Currently in Pain? No/denies    Aggravating Factors  doing more movement, walking    Pain Relieving Factors stretching              [facility] PT Assessment - 05/09/20 0807      Assessment   Medical Diagnosis Right groin wound, subsequent encounter S31.109D    Referring Provider (PT) Valinda Party, DO (02/29/2020)      Observation/Other Assessments   Focus on  Therapeutic Outcomes (FOTO)  58% function      Strength   Right Hip Extension 4/5    Right Hip ABduction 4/5                         [facility] Adult PT Treatment/Exercise - 05/09/20 0001      Knee/Hip Exercises: Aerobic   Elliptical L3 x 5 min ramp L1      Knee/Hip Exercises: Standing   Hip ADduction AROM;Strengthening;Both   Counter HHA; blue theraband, to fatigue   Hip Extension Both;AROM;Knee straight;Stengthening   blue theraband to fatigue; counter HHA   Other Standing Knee Exercises Dead lift with 45# 3x10   VCs for bending knees/hips   Other Standing Knee Exercises Sled push 43ft 20#; 40# x 2 each      Ankle Exercises: Stretches   Gastroc Stretch 2 reps;30 seconds   slant board                 PT Education - 05/09/20 0843    Education Details Reviewed HEP and updated today. discussed appropriate progression of strengthening/ endurance training with increased reps/ sets and upgraded resistance. being more proactive vs reactive when having increased calf stiffnes.    Person(s) Educated Patient    Methods Explanation;Verbal cues  Comprehension Verbalized understanding;Verbal cues required            PT Short Term Goals - 03/28/20 1043      PT SHORT TERM GOAL #1   Title pt to be I with inital HEP    Time 3    Period Weeks    Status Achieved    Target Date 03/25/20             PT Long Term Goals - 05/09/20 0812      PT LONG TERM GOAL #1   Title increase R hip strength to >/= 4+/5 to promote hip stability with prolonged standing/ walking activities    Period Weeks    Status Partially Met      PT LONG TERM GOAL #2   Title pt to be able to sit / stand and walk for >/= 60 min with </= 2/10 pain and no report of calf tightness    Status Achieved      PT LONG TERM GOAL #3   Title increase FOTO score to </= 35% limited to demo improvement in function    Status Partially Met      PT LONG TERM GOAL #4   Title pt to return to work and  personal exercise with no report of limitations / pain    Status Achieved      PT LONG TERM GOAL #5   Title pt to be IND with all HEP given to maintain and progress current LOF IND    Status Achieved                 Plan - 05/09/20 0840    Clinical Impression Statement Logan Weiss has made excellent progress with physical therapy increasing hip strength, standing endurance and additionally denis any pain. He does report fatigue mostly in the calves with prolonged standing/ walking and work but it resolve completely with stretching. He was able to do all exercises today with mild soreness in the calves that resolved following stretching. He is able to maintain and progress current level of function independently and will be discharged from PT today.    PT Treatment/Interventions ADLs/Self Care Home Management;Electrical Stimulation;Cryotherapy;Iontophoresis 4mg /ml Dexamethasone;Moist Heat;Ultrasound;Gait training;Therapeutic exercise;Therapeutic activities;Functional mobility training;Stair training;Balance training;Neuromuscular re-education;Manual techniques;Passive range of motion;Dry needling;Vasopneumatic Device;Patient/family education    PT Next Visit Plan D/C today    PT Home Exercise Plan [id number] - supine hip flexor/quad stretch with strap, seated adductor stretch, seated hamstring stretch, standing calf stretch, SLR, marching bridge, LAQ with red, seated hamstring curl with red, goblet squat with 20#, standing SL heel raises, lateral band walk with green, tandem stance with head turns, SL stance, tennis ball STM to calves    Consulted and Agree with Plan of Care Patient           Patient will benefit from skilled therapeutic intervention in order to improve the following deficits and impairments:  Abnormal gait, Decreased strength, Increased muscle spasms, Postural dysfunction, Pain, Decreased endurance, Decreased activity tolerance, Decreased balance  Visit Diagnosis: Muscle  weakness (generalized)  Muscle spasm of calf  Other abnormalities of gait and mobility     Problem List Patient Active Problem List   Diagnosis Date Noted  . Fever 10/11/2019  . Elevated liver enzymes 10/11/2019  . Sepsis ([place]) 10/10/2019  . Hyperglycemia   . Normocytic anemia 09/30/2019  . Necrotizing soft tissue infection 09/30/2019  . Abscess of multiple sites of perineum 09/27/2019  . Type 2 diabetes mellitus with hyperglycemia (HCC)  11/05/2017  . Type 2 diabetes mellitus with diabetic dermatitis ([place]) 06/08/2014  . History of MRSA infection 06/08/2014    Starr Lake PT, DPT, LAT, ATC  05/09/20  8:45 AM      [facility] [address] [place], [place], [address] Phone: [phone]   Fax:  [phone]  Name: Logan Weiss MRN: [id number] Date of Birth: May 12, 1971       PHYSICAL THERAPY DISCHARGE SUMMARY  Visits from Start of Care: 11  Current functional level related to goals / functional outcomes: See goals, FOTO 58% function   Remaining deficits: See assessment    Education / Equipment: HEP, theraband, posture, stretching  Plan: Patient agrees to discharge.  Patient goals were not met. Patient is being discharged due to meeting the stated rehab goals.  ?????        Curt Oatis PT, DPT, LAT, ATC  05/09/20  8:47 AM

## 2020-05-09 NOTE — Telephone Encounter (Signed)
Call to pt regarding the following: Pt reports that he has "opened area" of his right groin wound- his wife was cleaning the wound and noted blood & and small amount of drainage. He states the opening is approx. The width of "yarn" & runs the length of the groin wound. Denies any fever/chills, no n/v or other complications. Pt was d/c from P/T today & he will continue his at home PT/muscle strengthening on his own. I did inform him that Dr. Mikey Bussing is no longer a part of our office staff & that he would most likely be seen here with one of our PA's & may be referred to the [facility] clinic. He did indicate that he is out of the "Silver" dressings ordered by Dr. Mikey Bussing & he would like to try "Collagen"- I told him we can't order these without a f/u visit & a new order. He will clean & use "wet to dry" dressings until we see him. Our scheduler will call to make an appointment. Pt understands the plan of care

## 2020-05-15 NOTE — Progress Notes (Deleted)
   Subjective:     Patient ID: SHIVANSH HARDAWAY, male    DOB: [birth year], 49 y.o.   MRN: [id number]  No chief complaint on file.   HPI: The patient is a 49 y.o. male here for follow-up for his right groin wound.  At last visit with Dr. Mikey Bussing on 9/1 patient had some skin breakdown of the previously closed right groin wound.  Excess sweat and friction to the area were suspected causes of the skin breakdown.  Patient had reported he had been more active over the past week due to renovating his kitchen.  He had been using Kerracel AG and Vaseline daily.  Was directed to use Vaseline daily regardless of an open wound.  Patient call on 11/1, he reported an open area the width of " yarn" that runs the length of the wound.  He has been using wet-to-dry dressings since this call.  Review of Systems   Objective:   Vital Signs There were no vitals taken for this visit. Vital Signs and Nursing Note Reviewed  Physical Exam    Assessment/Plan:     ICD-10-CM   1. Right groin wound, subsequent encounter  S31.109D   2. Type 2 diabetes mellitus with other specified complication, without long-term current use of insulin (HCC)  E11.69     ***   Eldridge Abrahams, PA-C 05/15/2020, 5:24 PM

## 2020-05-16 ENCOUNTER — Encounter: Payer: 59 | Admitting: Physical Therapy

## 2020-05-19 ENCOUNTER — Ambulatory Visit: Payer: 59 | Admitting: Plastic Surgery

## 2020-05-19 DIAGNOSIS — S31109D Unspecified open wound of abdominal wall, unspecified quadrant without penetration into peritoneal cavity, subsequent encounter: Secondary | ICD-10-CM

## 2020-05-19 DIAGNOSIS — E1169 Type 2 diabetes mellitus with other specified complication: Secondary | ICD-10-CM

## 2020-05-23 ENCOUNTER — Encounter: Payer: 59 | Admitting: Physical Therapy

## 2020-06-20 IMAGING — CT CT PELVIS W/ CM
2 of 3 series · 15 of 46 positions shown, 17 images · IV contrast (omnipaque)
Comparison: None.
COMPARISON: None.

Addendum:
CLINICAL DATA: Abscess in upper thigh. Evaluate extension into
perineum.

EXAM:
CT PELVIS WITH CONTRAST
TECHNIQUE: Multidetector CT imaging of the pelvis was performed using the
standard protocol following the bolus administration of intravenous
contrast.
CONTRAST:  100mL OMNIPAQUE IOHEXOL 300 MG/ML  SOLN

[Series 5: soft tissue · axial · 0.78mm/px · z∈[-853,-493]mm · 12 of 208 slices shown, 14 images]
[im 14/208  soft-tissue]
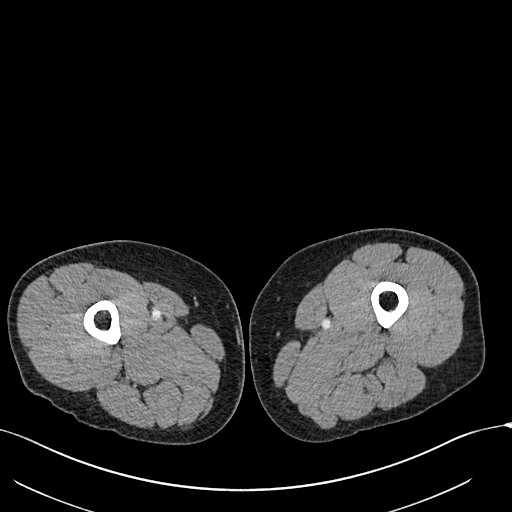
[im 14/208  bone]
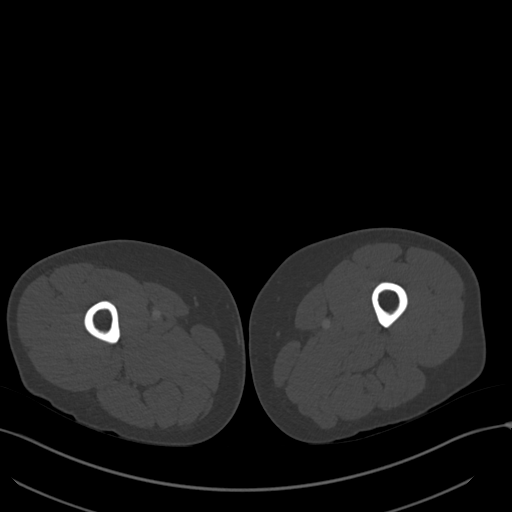
[im 27/208  soft-tissue]
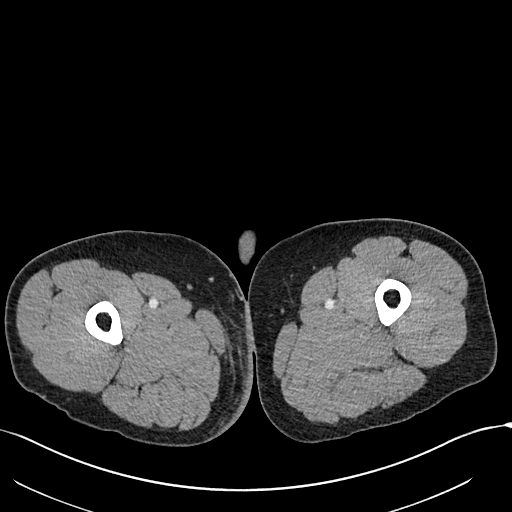
[im 47/208  soft-tissue]
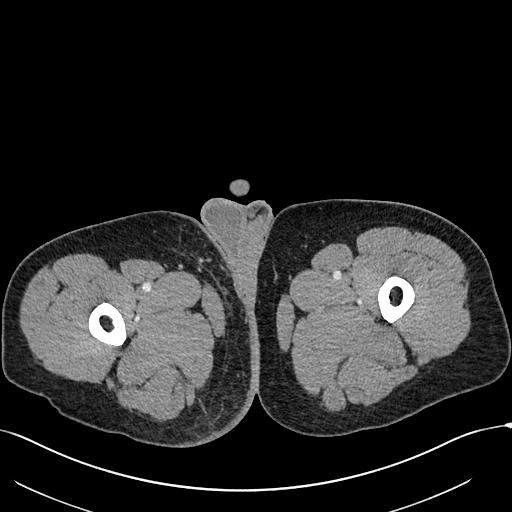
[im 61/208  soft-tissue]
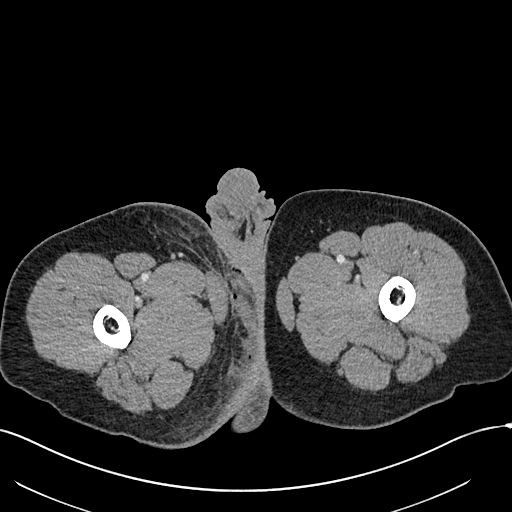
[im 81/208  soft-tissue]
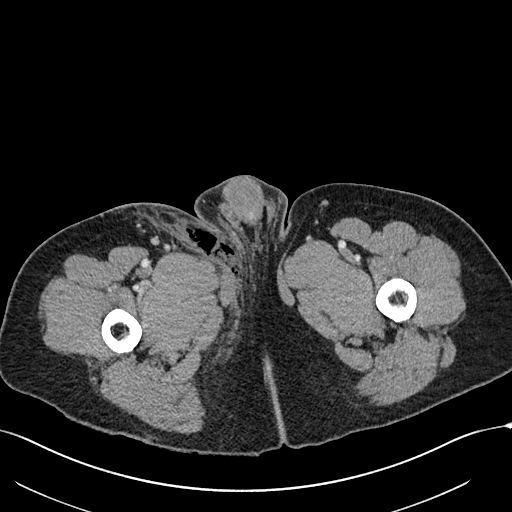
[im 94/208  soft-tissue]
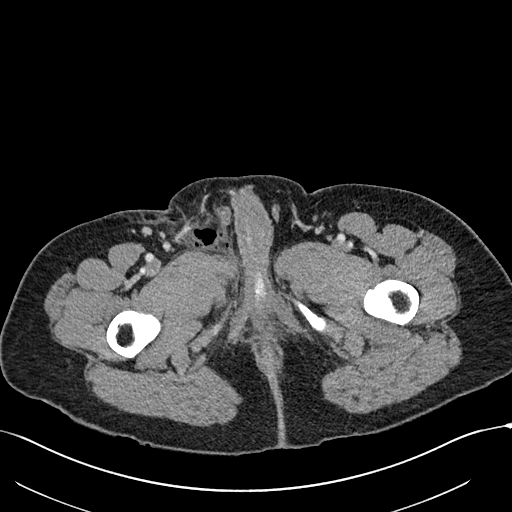
[im 114/208  soft-tissue]
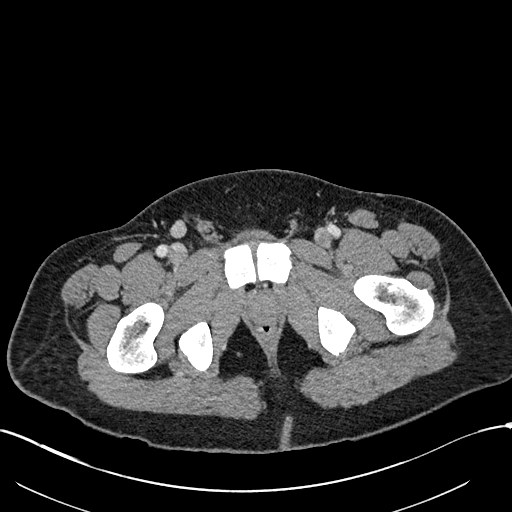
[im 127/208  soft-tissue]
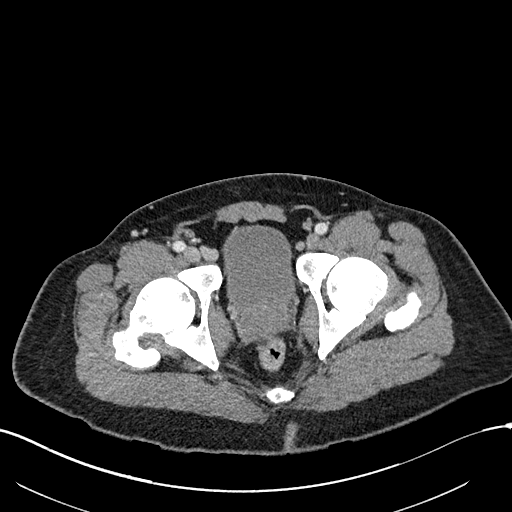
[im 147/208  soft-tissue]
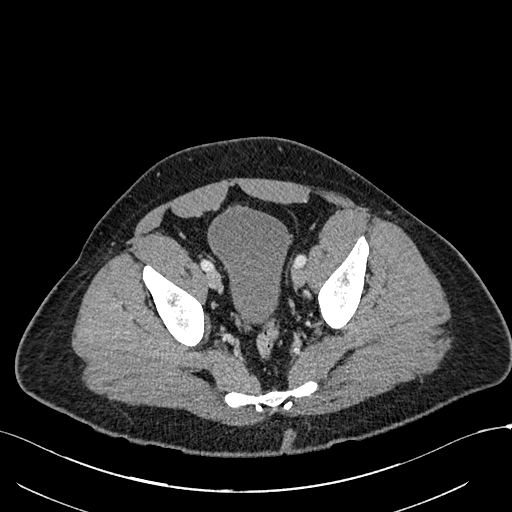
[im 147/208  bone]
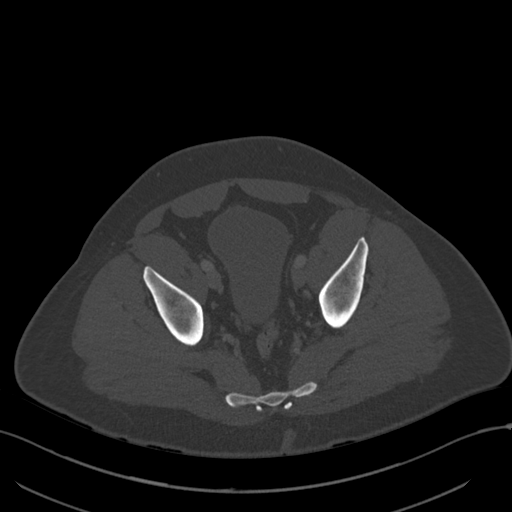
[im 161/208  soft-tissue]
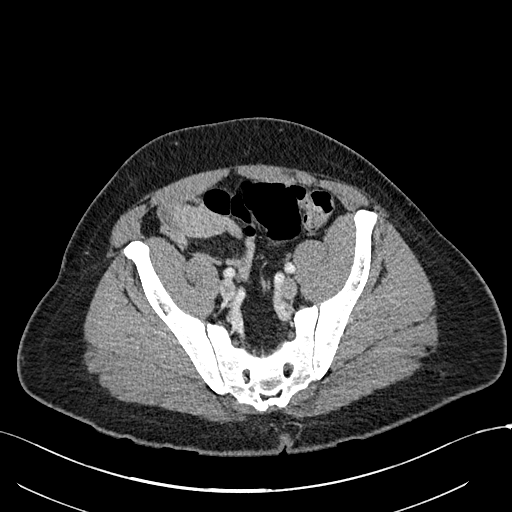
[im 181/208  soft-tissue]
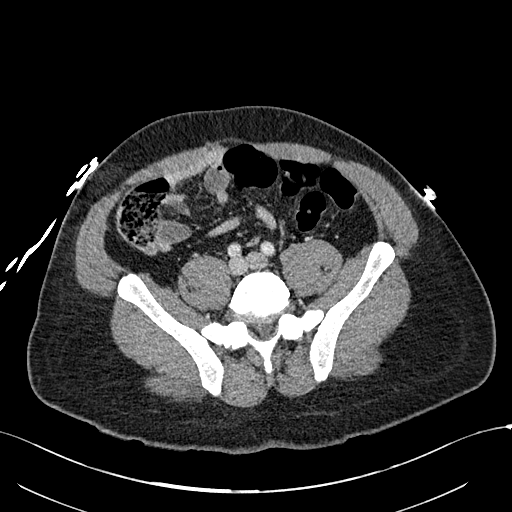
[im 194/208  soft-tissue]
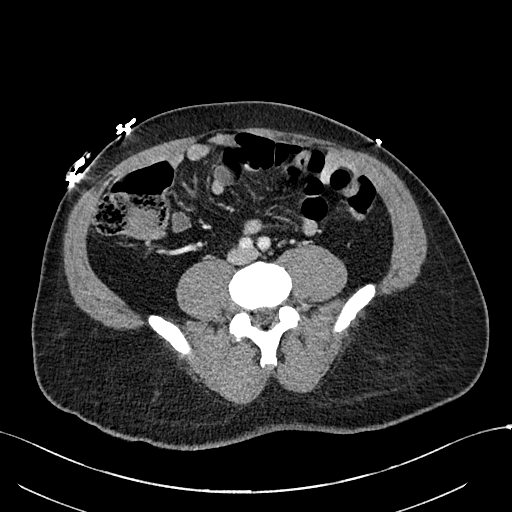

[Series 6: cor soft · coronal · 0.78mm/px · 3 of 143 slices shown]
[im 48/143  soft-tissue]
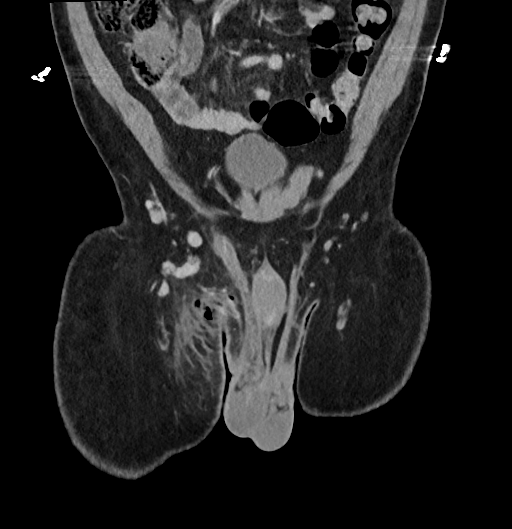
[im 64/143  soft-tissue]
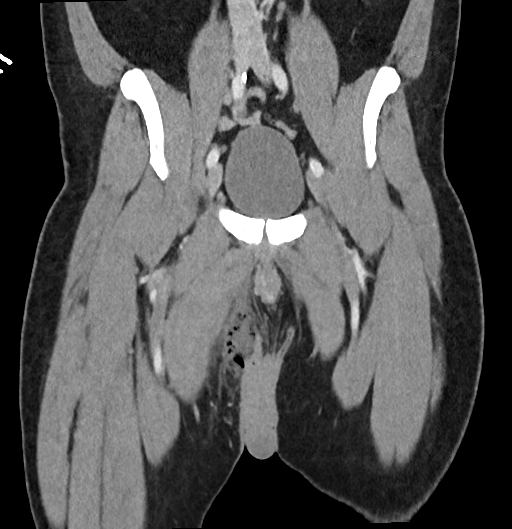
[im 79/143  soft-tissue]
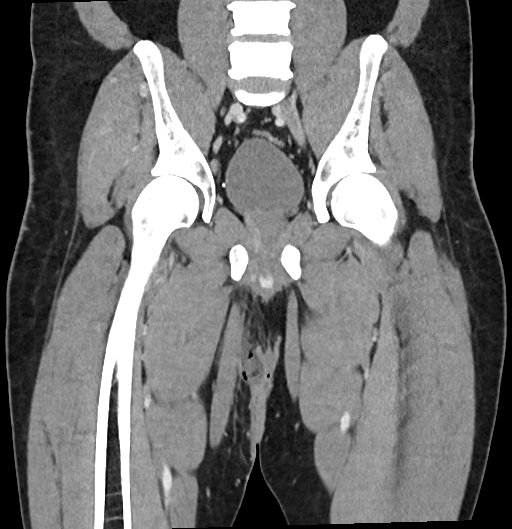

[15 of 46 positions shown; findings below may reference images not displayed]

FINDINGS: Urinary Tract:  No abnormality visualized.

Bowel:  Unremarkable visualized pelvic bowel loops.

Vascular/Lymphatic: No pathologically enlarged lymph nodes. No
significant vascular abnormality seen.

Reproductive:  No mass or other significant abnormality

Other: There is air and fluid in the subcutaneous soft tissues with
overlying skin thickening. The largest collection is seen in the
upper medial thigh. There is extension into the right mons pubis
with surrounding fat stranding. No definite involvement of the right
spermatic cord. The fluid and air extends posteriorly into the right
buttocks with overlying skin thickening. The fluid and air is not
particularly well-defined and relatively diffuse throughout the soft
tissues. There is significant surrounding fat stranding. There is
overlying skin thickening. The fluid and air abuts the medial
musculature of the upper thigh. There is not a clear fat plane
between the fluid and air and the adjacent musculature. No bony
erosion.

Musculoskeletal: No suspicious bone lesions identified.
IMPRESSION: 1. Fluid and air is seen in the right medial thigh extending into
the right mons pubis without definite involvement of the spermatic
cord. The fluid and air extends inferiorly and posteriorly into the
right buttocks, abutting the intergluteal cleft. There is
significant surrounding fat stranding and overlying skin thickening.
The area involved is quite large extending from the anteromedial
right thigh into the posterior right buttocks. No definite
involvement of the scrotum. The fluid and air is not particularly
well-defined. The findings are consistent with an infectious
process. There is not 1 discrete abscess. The findings are otherwise
nonspecific. Necrotizing fasciitis cannot be excluded on CT imaging.
Recommend clinical correlation.
2. No other abnormalities.

ADDENDUM:
Findings called to the patient's provider in the ER, Yohisa Pxndx.

*** End of Addendum ***
FINDINGS: Urinary Tract:  No abnormality visualized.

Bowel:  Unremarkable visualized pelvic bowel loops.

Vascular/Lymphatic: No pathologically enlarged lymph nodes. No
significant vascular abnormality seen.

Reproductive:  No mass or other significant abnormality

Other: There is air and fluid in the subcutaneous soft tissues with
overlying skin thickening. The largest collection is seen in the
upper medial thigh. There is extension into the right mons pubis
with surrounding fat stranding. No definite involvement of the right
spermatic cord. The fluid and air extends posteriorly into the right
buttocks with overlying skin thickening. The fluid and air is not
particularly well-defined and relatively diffuse throughout the soft
tissues. There is significant surrounding fat stranding. There is
overlying skin thickening. The fluid and air abuts the medial
musculature of the upper thigh. There is not a clear fat plane
between the fluid and air and the adjacent musculature. No bony
erosion.

Musculoskeletal: No suspicious bone lesions identified.
IMPRESSION: 1. Fluid and air is seen in the right medial thigh extending into
the right mons pubis without definite involvement of the spermatic
cord. The fluid and air extends inferiorly and posteriorly into the
right buttocks, abutting the intergluteal cleft. There is
significant surrounding fat stranding and overlying skin thickening.
The area involved is quite large extending from the anteromedial
right thigh into the posterior right buttocks. No definite
involvement of the scrotum. The fluid and air is not particularly
well-defined. The findings are consistent with an infectious
process. There is not 1 discrete abscess. The findings are otherwise
nonspecific. Necrotizing fasciitis cannot be excluded on CT imaging.
Recommend clinical correlation.
2. No other abnormalities.

## 2020-08-10 ENCOUNTER — Other Ambulatory Visit: Payer: Self-pay | Admitting: *Deleted

## 2020-08-10 NOTE — Patient Outreach (Signed)
[facility]) Care Management  08/10/2020  LAKER THOMPSON 07-19-1970 [id number]   Referral Date: 2/2 Referral Source: Self Referral Reason: Diabetes management Insurance: [facility]   Outreach attempt #1, unsuccessful, HIPAA compliant voice message left.   Plan: RN CM will send unsuccessful outreach letter and follow up within the next 3-4 business days.  Kemper Durie, [place], MSN [facility] Care Management  [facility] Manager [phone]

## 2020-08-15 ENCOUNTER — Other Ambulatory Visit: Payer: Self-pay | Admitting: *Deleted

## 2020-08-15 ENCOUNTER — Encounter: Payer: Self-pay | Admitting: *Deleted

## 2020-08-16 ENCOUNTER — Ambulatory Visit: Payer: Self-pay | Admitting: *Deleted

## 2020-08-16 NOTE — Patient Outreach (Signed)
[facility]) Care Management  [facility] Care Manager  08/16/2020   Logan Weiss March 28, 1971 [id number]     Referral Date: 2/2 Referral Source: Self Referral Reason: Diabetes management Insurance: [facility]  Outreach attempt #2, successful.  Identity verified.  This care manager introduced self and stated purpose of call.  [facility] care management services explained.    Social: Lives with wife, independent in all ADL's.  Was active with [facility] in the past, worked hard to decrease A1C to 7.  He admits that he has not stayed compliant, A1C increased back to 8.9, would like to re-engage in services.  He relates this to not only being inconsistent, but has also had issues with grief over the past year.  Report between he and his wife, they have lost at least 15 loved ones in the last year alone.  Agrees to counseling.  Conditions: Per chart, history of diabetes and hyperlipidemia.  He is monitoring blood sugars, range high 100's - 200's.  Today's reading was 154.  He also report weight gain from 175 pounds last year to 225 currently.    Medications: Reviewed with member, state he has been compliant but is not taking Lantus currently due to cost.  Report there were changes in pharmacy vendors through [facility], Novolog is now over $100 and Lantus over $200, interested in medication assistance.    Appointments:  Was seen by PCP on 1/6, will follow up in 3 months.  Having new issues with right arm/hand pain/discomfort, had MRI done, will see neurologist on 2/10.  Able to drive self to appointments.    Denies any urgent concerns, encouraged to contact this care manager with questions.    Encounter Medications:  Outpatient Encounter Medications as of 08/15/2020  Medication Sig Note  . ascorbic acid (VITAMIN C) 500 MG tablet Take 500 mg by mouth daily.   Marland Kitchen atorvastatin (LIPITOR) 20 MG tablet Take 20 mg by mouth daily.   . Cholecalciferol (VITAMIN D-400 PO) Take by mouth.   . GlucoCom  Lancets MISC Check blood sugar TID & QHS   . glucose blood (CHOICE DM FORA G20 TEST STRIPS) test strip Use as instructed   . insulin aspart (NOVOLOG) 100 UNIT/ML FlexPen Inject 12 Units into the skin 3 (three) times daily with meals. (Patient taking differently: Inject 8 Units into the skin 3 (three) times daily with meals.)   . insulin glargine (LANTUS) 100 UNIT/ML Solostar Pen Inject 35 Units into the skin at bedtime. (Patient taking differently: Inject 27 Units into the skin at bedtime.) 08/15/2020: Increased to 46 units at bedtime  . Insulin Pen Needle 32G X 4 MM MISC use to inject insulin 4 times a day   . Vitamin D, Cholecalciferol, 10 MCG (400 UNIT) CAPS Take by mouth.   . vitamin E 1000 UNIT capsule Take 1,000 Units by mouth daily.   Marland Kitchen zinc gluconate 50 MG tablet Take 50 mg by mouth daily.    No facility-administered encounter medications on file as of 08/15/2020.    Functional Status:  In your present state of health, do you have any difficulty performing the following activities: 11/10/2019 09/28/2019  Hearing? N N  Vision? N N  Difficulty concentrating or making decisions? N N  Walking or climbing stairs? N N  Dressing or bathing? N N  Doing errands, shopping? N N  Preparing Food and eating ? N -  Using the Toilet? N -  In the past six months, have you accidently leaked  urine? N -  Do you have problems with loss of bowel control? N -  Managing your Medications? N -  Managing your Finances? N -  Housekeeping or managing your Housekeeping? N -  Some recent data might be hidden    Fall/Depression Screening: Fall Risk  10/29/2019  Falls in the past year? 0  Number falls in past yr: 0  Injury with Fall? 0  Risk for fall due to : Other (Comment)  Risk for fall due to: Comment Dizzy episode no fall  Follow up Education provided   [facility] 2/9 Scores 08/15/2020 10/29/2019  PHQ - 2 Score 2 0  PHQ- 9 Score 3 -    Assessment:  Goals Addressed            This Visit's Progress   .  Monitor and Manage My Blood Sugar-Diabetes Type 2       Timeframe:  Short-Term Goal Priority:  Medium Start Date:        08/15/2020                     Expected End Date:     09/12/2020                  Follow Up Date 08/29/2020   - check blood sugar at prescribed times - check blood sugar if I feel it is too high or too low - enter blood sugar readings and medication or insulin into daily log    Why is this important?    Checking your blood sugar at home helps to keep it from getting very high or very low.   Writing the results in a diary or log helps the doctor know how to care for you.   Your blood sugar log should have the time, date and the results.   Also, write down the amount of insulin or other medicine that you take.   Other information, like what you ate, exercise done and how you were feeling, will also be helpful.     Notes:     . Set My Target A1C-Diabetes Type 2       Timeframe:  Long-Range Goal Priority:  High Start Date:        08/15/2020                     Expected End Date:     11/12/2020                  Follow Up Date 08/29/2009   - set target A1C    Why is this important?    Your target A1C is decided together by you and your doctor.   It is based on several things like your age and other health issues.    Notes:        Plan:  Follow-up:  Patient agrees to Care Plan and Follow-up. Will place referral to CSW for grief counseling.  Will place referral to [facility] pharmacy team for medication assistance.  Will send member education on diabetes management.  Will follow up within the next 2 weeks.

## 2020-08-16 NOTE — Patient Instructions (Signed)
Diabetes Mellitus and Nutrition, Adult When you have diabetes, or diabetes mellitus, it is very important to have healthy eating habits because your blood sugar (glucose) levels are greatly affected by what you eat and drink. Eating healthy foods in the right amounts, at about the same times every day, can help you:  Control your blood glucose.  Lower your risk of heart disease.  Improve your blood pressure.  Reach or maintain a healthy weight. What can affect my meal plan? Every person with diabetes is different, and each person has different needs for a meal plan. Your health care provider may recommend that you work with a dietitian to make a meal plan that is best for you. Your meal plan may vary depending on factors such as:  The calories you need.  The medicines you take.  Your weight.  Your blood glucose, blood pressure, and cholesterol levels.  Your activity level.  Other health conditions you have, such as heart or kidney disease. How do carbohydrates affect me? Carbohydrates, also called carbs, affect your blood glucose level more than any other type of food. Eating carbs naturally raises the amount of glucose in your blood. Carb counting is a method for keeping track of how many carbs you eat. Counting carbs is important to keep your blood glucose at a healthy level, especially if you use insulin or take certain oral diabetes medicines. It is important to know how many carbs you can safely have in each meal. This is different for every person. Your dietitian can help you calculate how many carbs you should have at each meal and for each snack. How does alcohol affect me? Alcohol can cause a sudden decrease in blood glucose (hypoglycemia), especially if you use insulin or take certain oral diabetes medicines. Hypoglycemia can be a life-threatening condition. Symptoms of hypoglycemia, such as sleepiness, dizziness, and confusion, are similar to symptoms of having too much  alcohol.  Do not drink alcohol if: ? Your health care provider tells you not to drink. ? You are pregnant, may be pregnant, or are planning to become pregnant.  If you drink alcohol: ? Do not drink on an empty stomach. ? Limit how much you use to:  0-1 drink a day for women.  0-2 drinks a day for men. ? Be aware of how much alcohol is in your drink. In the [place] one drink equals one 12 oz bottle of beer (355 mL), one 5 oz glass of wine (148 mL), or one 1 oz glass of hard liquor (44 mL). ? Keep yourself hydrated with water, diet soda, or unsweetened iced tea.  Keep in mind that regular soda, juice, and other mixers may contain a lot of sugar and must be counted as carbs. What are tips for following this plan? Reading food labels  Start by checking the serving size on the "Nutrition Facts" label of packaged foods and drinks. The amount of calories, carbs, fats, and other nutrients listed on the label is based on one serving of the item. Many items contain more than one serving per package.  Check the total grams (g) of carbs in one serving. You can calculate the number of servings of carbs in one serving by dividing the total carbs by 15. For example, if a food has 30 g of total carbs per serving, it would be equal to 2 servings of carbs.  Check the number of grams (g) of saturated fats and trans fats in one serving. Choose foods that have   a low amount or none of these fats.  Check the number of milligrams (mg) of salt (sodium) in one serving. Most people should limit total sodium intake to less than 2,300 mg per day.  Always check the nutrition information of foods labeled as "low-fat" or "nonfat." These foods may be higher in added sugar or refined carbs and should be avoided.  Talk to your dietitian to identify your daily goals for nutrients listed on the label. Shopping  Avoid buying canned, pre-made, or processed foods. These foods tend to be high in fat, sodium, and added  sugar.  Shop around the outside edge of the grocery store. This is where you will most often find fresh fruits and vegetables, bulk grains, fresh meats, and fresh dairy. Cooking  Use low-heat cooking methods, such as baking, instead of high-heat cooking methods like deep frying.  Cook using healthy oils, such as olive, canola, or sunflower oil.  Avoid cooking with butter, cream, or high-fat meats. Meal planning  Eat meals and snacks regularly, preferably at the same times every day. Avoid going long periods of time without eating.  Eat foods that are high in fiber, such as fresh fruits, vegetables, beans, and whole grains. Talk with your dietitian about how many servings of carbs you can eat at each meal.  Eat 4-6 oz (112-168 g) of lean protein each day, such as lean meat, chicken, fish, eggs, or tofu. One ounce (oz) of lean protein is equal to: ? 1 oz (28 g) of meat, chicken, or fish. ? 1 egg. ?  cup (62 g) of tofu.  Eat some foods each day that contain healthy fats, such as avocado, nuts, seeds, and fish.   What foods should I eat? Fruits Berries. Apples. Oranges. Peaches. Apricots. Plums. Grapes. Mango. Papaya. Pomegranate. Kiwi. Cherries. Vegetables Lettuce. Spinach. Leafy greens, including kale, chard, collard greens, and mustard greens. Beets. Cauliflower. Cabbage. Broccoli. Carrots. Green beans. Tomatoes. Peppers. Onions. Cucumbers. Brussels sprouts. Grains Whole grains, such as whole-wheat or whole-grain bread, crackers, tortillas, cereal, and pasta. Unsweetened oatmeal. Quinoa. Brown or wild rice. Meats and other proteins Seafood. Poultry without skin. Lean cuts of poultry and beef. Tofu. Nuts. Seeds. Dairy Low-fat or fat-free dairy products such as milk, yogurt, and cheese. The items listed above may not be a complete list of foods and beverages you can eat. Contact a dietitian for more information. What foods should I avoid? Fruits Fruits canned with  syrup. Vegetables Canned vegetables. Frozen vegetables with butter or cream sauce. Grains Refined white flour and flour products such as bread, pasta, snack foods, and cereals. Avoid all processed foods. Meats and other proteins Fatty cuts of meat. Poultry with skin. Breaded or fried meats. Processed meat. Avoid saturated fats. Dairy Full-fat yogurt, cheese, or milk. Beverages Sweetened drinks, such as soda or iced tea. The items listed above may not be a complete list of foods and beverages you should avoid. Contact a dietitian for more information. Questions to ask a health care provider  Do I need to meet with a diabetes educator?  Do I need to meet with a dietitian?  What number can I call if I have questions?  When are the best times to check my blood glucose? Where to find more information:  American Diabetes Association: diabetes.org  Academy of Nutrition and Dietetics: www.eatright.org  National Institute of Diabetes and Digestive and Kidney Diseases: www.niddk.nih.gov  Association of Diabetes Care and Education Specialists: www.diabeteseducator.org Summary  It is important to have healthy eating   habits because your blood sugar (glucose) levels are greatly affected by what you eat and drink.  A healthy meal plan will help you control your blood glucose and maintain a healthy lifestyle.  Your health care provider may recommend that you work with a dietitian to make a meal plan that is best for you.  Keep in mind that carbohydrates (carbs) and alcohol have immediate effects on your blood glucose levels. It is important to count carbs and to use alcohol carefully. This information is not intended to replace advice given to you by your health care provider. Make sure you discuss any questions you have with your health care provider. Document Revised: 06/02/2019 Document Reviewed: 06/02/2019 Elsevier Patient Education  2021 [place]. Diabetes Mellitus and  Exercise Exercising regularly is important for overall health, especially for people who have diabetes mellitus. Exercising is not only about losing weight. It has many other health benefits, such as increasing muscle strength and bone density and reducing body fat and stress. This leads to improved fitness, flexibility, and endurance, all of which result in better overall health. What are the benefits of exercise if I have diabetes? Exercise has many benefits for people with diabetes. They include:  Helping to lower and control blood sugar (glucose).  Helping the body to respond better to the hormone insulin by improving insulin sensitivity.  Reducing how much insulin the body needs.  Lowering the risk for heart disease by: ? Lowering "bad" cholesterol and triglyceride levels. ? Increasing "good" cholesterol levels. ? Lowering blood pressure. ? Lowering blood glucose levels. What is my activity plan? Your health care provider or certified diabetes educator can help you make a plan for the type and frequency of exercise that works for you. This is called your activity plan. Be sure to:  Get at least 150 minutes of medium-intensity or high-intensity exercise each week. Exercises may include brisk walking, biking, or water aerobics.  Do stretching and strengthening exercises, such as yoga or weight lifting, at least 2 times a week.  Spread out your activity over at least 3 days of the week.  Get some form of physical activity each day. ? Do not go more than 2 days in a row without some kind of physical activity. ? Avoid being inactive for more than 90 minutes at a time. Take frequent breaks to walk or stretch.  Choose exercises or activities that you enjoy. Set realistic goals.  Start slowly and gradually increase your exercise intensity over time.   How do I manage my diabetes during exercise? Monitor your blood glucose  Check your blood glucose before and after exercising. If your  blood glucose is: ? 240 mg/dL (13.3 mmol/L) or higher before you exercise, check your urine for ketones. These are chemicals created by the liver. If you have ketones in your urine, do not exercise until your blood glucose returns to normal. ? 100 mg/dL (5.6 mmol/L) or lower, eat a snack containing 15-20 grams of carbohydrate. Check your blood glucose 15 minutes after the snack to make sure that your glucose level is above 100 mg/dL (5.6 mmol/L) before you start your exercise.  Know the symptoms of low blood glucose (hypoglycemia) and how to treat it. Your risk for hypoglycemia increases during and after exercise. Follow these tips and your health care provider's instructions  Keep a carbohydrate snack that is fast-acting for use before, during, and after exercise to help prevent or treat hypoglycemia.  Avoid injecting insulin into areas of the body  that are going to be exercised. For example, avoid injecting insulin into: ? Your arms, when you are about to play tennis. ? Your legs, when you are about to go jogging.  Keep records of your exercise habits. Doing this can help you and your health care provider adjust your diabetes management plan as needed. Write down: ? Food that you eat before and after you exercise. ? Blood glucose levels before and after you exercise. ? The type and amount of exercise you have done.  Work with your health care provider when you start a new exercise or activity. He or she may need to: ? Make sure that the activity is safe for you. ? Adjust your insulin, other medicines, and food that you eat.  Drink plenty of water while you exercise. This prevents loss of water (dehydration) and problems caused by a lot of heat in the body (heat stroke).   Where to find more information  American Diabetes Association: www.diabetes.org Summary  Exercising regularly is important for overall health, especially for people who have diabetes mellitus.  Exercising has many  health benefits. It increases muscle strength and bone density and reduces body fat and stress. It also lowers and controls blood glucose.  Your health care provider or certified diabetes educator can help you make an activity plan for the type and frequency of exercise that works for you.  Work with your health care provider to make sure any new activity is safe for you. Also work with your health care provider to adjust your insulin, other medicines, and the food you eat. This information is not intended to replace advice given to you by your health care provider. Make sure you discuss any questions you have with your health care provider. Document Revised: 03/23/2019 Document Reviewed: 03/23/2019 Elsevier Patient Education  2021 Elsevier Inc.     Goals Addressed            This Visit's Progress   . Monitor and Manage My Blood Sugar-Diabetes Type 2       Timeframe:  Short-Term Goal Priority:  Medium Start Date:        08/15/2020                     Expected End Date:     09/12/2020                  Follow Up Date 08/29/2020   - check blood sugar at prescribed times - check blood sugar if I feel it is too high or too low - enter blood sugar readings and medication or insulin into daily log    Why is this important?    Checking your blood sugar at home helps to keep it from getting very high or very low.   Writing the results in a diary or log helps the doctor know how to care for you.   Your blood sugar log should have the time, date and the results.   Also, write down the amount of insulin or other medicine that you take.   Other information, like what you ate, exercise done and how you were feeling, will also be helpful.     Notes:     . Set My Target A1C-Diabetes Type 2       Timeframe:  Long-Range Goal Priority:  High Start Date:        08/15/2020  Expected End Date:     11/12/2020                  Follow Up Date 08/29/2009   - set target A1C     Why is this important?    Your target A1C is decided together by you and your doctor.   It is based on several things like your age and other health issues.    Notes:

## 2020-08-17 ENCOUNTER — Telehealth: Payer: Self-pay | Admitting: *Deleted

## 2020-08-17 NOTE — Patient Outreach (Signed)
[facility]) Care Management  08/17/2020  Logan Weiss 02/14/1971 [id number]  CSW received consult and was able to connect with pt by phone today. Pt's identity was confirmed and CSW then introduced self, role and reason for call.  Pt acknowledges he and his family have lost a lot of family members/loved ones recently and "it has taken its toll".  CSW discussed the Bereavement counseling program at local hospice agency and he is interested in pursuing this.  Pt requests that CSW mail material to him.   CSW explained the services provided and that they are free of charge.    CSW will mail info to pt and ask [facility] RNCM to contact CSW if further needs or concerns arise. Pt declined further questions or needs.  Reece Levy, MSW, LCSW Clinical Social Worker  [facility] [employer] [phone]

## 2020-08-17 NOTE — Patient Outreach (Signed)
[facility] Customer service manager [facility]) Care Management  08/17/2020  STILES MAXCY 1970/11/08 [id number]  Referral for medication assistance from [facility], RN sent to [facility].  Baruch Gouty [facility] Management Assistant [phone]

## 2020-08-29 ENCOUNTER — Other Ambulatory Visit: Payer: Self-pay | Admitting: *Deleted

## 2020-08-29 NOTE — Patient Outreach (Signed)
[facility]) Care Management  08/29/2020  CHOZEN LATULIPPE 1970/08/25 [id number]   Call placed to member to follow up on diabetes management, no answer, HIPAA compliant voice message left.  Will follow up within the next 3-4 business days.  Kemper Durie, [place], MSN [facility] Care Management  [facility] Manager [phone]

## 2020-08-30 ENCOUNTER — Other Ambulatory Visit: Payer: Self-pay | Admitting: *Deleted

## 2020-08-30 NOTE — Patient Outreach (Signed)
[facility]) Care Management  08/30/2020  Logan Weiss [birth year] [id number]   Incoming call received from member after missed outreach yesterday.  State he has been doing better, blood sugars are now decreasing (this morning was 102) as he has been able to get his bedtime insulin at a better price.  His mealtime insulin is still costly but confirms that he is working with pharmacy team to obtain.  He does have some currently but will run out soon.  Denies any urgent concerns, encouraged to contact this care manager with questions.  Agrees to care plan and follow up within the next month.  Goals Addressed            This Visit's Progress   . Monitor and Manage My Blood Sugar-Diabetes Type 2   On track    Timeframe:  Short-Term Goal Priority:  Medium Start Date:        08/15/2020                     Expected End Date:     09/12/2020                  Follow Up Date 08/29/2020   - check blood sugar at prescribed times - check blood sugar if I feel it is too high or too low - enter blood sugar readings and medication or insulin into daily log    Why is this important?    Checking your blood sugar at home helps to keep it from getting very high or very low.   Writing the results in a diary or log helps the doctor know how to care for you.   Your blood sugar log should have the time, date and the results.   Also, write down the amount of insulin or other medicine that you take.   Other information, like what you ate, exercise done and how you were feeling, will also be helpful.     Notes:   2/22 - Confirmed member is working with [facility] pharmacy team to obtain insulin at a better price    . Set My Target A1C-Diabetes Type 2   On track    Timeframe:  Long-Range Goal Priority:  High Start Date:        08/15/2020                     Expected End Date:     11/12/2020                  Follow Up Date 08/29/2009   - set target A1C    Why is this important?    Your target  A1C is decided together by you and your doctor.   It is based on several things like your age and other health issues.    Notes:       Kemper Durie, RN, MSN [facility] Care Management  [facility] Manager [phone]

## 2020-09-01 ENCOUNTER — Ambulatory Visit: Payer: Self-pay | Admitting: *Deleted

## 2020-09-27 ENCOUNTER — Other Ambulatory Visit: Payer: Self-pay | Admitting: *Deleted

## 2020-09-27 NOTE — Patient Outreach (Signed)
[facility]) Care Management  09/27/2020  JONLUKE COBBINS 05/08/1971 [id number]   Outgoing call placed to member, no answer, HIPAA compliant voice message left.  Will follow up within the next 3-4 business days.  Kemper Durie, [place], MSN [facility] Care Management  [facility] Manager [phone]

## 2020-09-30 ENCOUNTER — Other Ambulatory Visit: Payer: Self-pay | Admitting: *Deleted

## 2020-09-30 NOTE — Patient Outreach (Addendum)
[facility]) Care Management  09/30/2020  Logan Weiss 1970-09-28 [id number]   Outreach attempt #2, unsuccessful, HIPAA compliant voice message left.  Will send outreach letter and follow up within the next 3-4 business days.     Update (late entry):  Incoming call received back from member.  State he is doing well but still having trouble with obtaining affordable insulin.  He ran out of his mealtime insulin a couple weeks ago, provider now placed him on Ozempic with coupons.  Blood sugar today was 128.  He will follow up with [facility] pharmacist as he is unsure how long the coupons will last.  Follow up with PCP on 4/6.  Denies any urgent concerns, encouraged to contact this care manager with questions.  Agrees to follow up within the next month.   Kemper Durie, [place], MSN [facility] Care Management  [facility] Manager [phone]

## 2020-10-28 ENCOUNTER — Other Ambulatory Visit: Payer: Self-pay | Admitting: *Deleted

## 2020-10-28 NOTE — Patient Outreach (Addendum)
[facility]) Care Management  10/28/2020  DANIELA HERNAN Sep 26, 1970 [id number]   Outgoing call placed to member, no answer, HIPAA compliant voice message left.  Will follow up within the next 3-4 business days.    Update:    Incoming call received from member, state he is doing well however still has not heard from [facility] pharmacist.  Encouraged to call again, this care manager will also follow up.  Since starting Ozempic, state blood sugars have decreased.  Denies any urgent concerns, encouraged to contact this care manager with questions.  Agrees to follow up within the next month.   Goals Addressed            This Visit's Progress   . Monitor and Manage My Blood Sugar-Diabetes Type 2   On track    Timeframe:  Short-Term Goal Priority:  Medium Start Date:        08/15/2020                     Expected End Date:     09/12/2020                  Follow Up Date 08/29/2020   - check blood sugar at prescribed times - check blood sugar if I feel it is too high or too low - enter blood sugar readings and medication or insulin into daily log    Why is this important?    Checking your blood sugar at home helps to keep it from getting very high or very low.   Writing the results in a diary or log helps the doctor know how to care for you.   Your blood sugar log should have the time, date and the results.   Also, write down the amount of insulin or other medicine that you take.   Other information, like what you ate, exercise done and how you were feeling, will also be helpful.     Notes:   2/22 - Confirmed member is working with [facility] pharmacy team to obtain insulin at a better price  3/25 - blood sugars improved, today 128  4/22 - Blood sugars range low 100's-140's.      . Set My Target A1C-Diabetes Type 2   On track    Timeframe:  Long-Range Goal Priority:  High Start Date:        08/15/2020                     Expected End Date:     11/12/2020                      - set target A1C  - <7   Why is this important?    Your target A1C is decided together by you and your doctor.   It is based on several things like your age and other health issues.    Notes:   4/22 - Encouraged to reach out to [facility] pharmacist regarding cost of insulin.  This RNCM will continue collaboration with pharmacy team       Kemper Durie, RN, MSN [facility] Care Management  [facility] Manager [phone]

## 2020-11-02 ENCOUNTER — Ambulatory Visit: Payer: Self-pay | Admitting: *Deleted

## 2020-11-25 ENCOUNTER — Other Ambulatory Visit: Payer: Self-pay | Admitting: *Deleted

## 2020-11-25 NOTE — Patient Outreach (Signed)
[facility]) Care Management  11/25/2020  MERVILLE HIJAZI Jun 28, 1971 [id number]   Outgoing call placed to member to follow up on diabetes management, no answer, HIPAA compliant voice message left.  Will follow up within the next 3-4 business days.    Update:  Incoming call received back from member.   State he is doing well, was seen by the foot specialist last week, will continue proper foot care and remain active with podiatry.  Report he received voice message from [facility] pharmacist, will call back as he will be in need of more coupons to keep medications affordable.  Denies any urgent concerns, encouraged to contact this care manager with questions.  Agrees to follow up within the next 3 months as member has remained stable.  Goals Addressed            This Visit's Progress   . Monitor and Manage My Blood Sugar-Diabetes Type 2   On track    Timeframe:  Short-Term Goal Priority:  Medium Start Date:        5/20  (goal extended,medication changes)                Expected End Date:     6/20               Barriers: Knowledge  - check blood sugar at prescribed times - check blood sugar if I feel it is too high or too low - enter blood sugar readings and medication or insulin into daily log    Why is this important?    Checking your blood sugar at home helps to keep it from getting very high or very low.   Writing the results in a diary or log helps the doctor know how to care for you.   Your blood sugar log should have the time, date and the results.   Also, write down the amount of insulin or other medicine that you take.   Other information, like what you ate, exercise done and how you were feeling, will also be helpful.     Notes:   2/22 - Confirmed member is working with [facility] pharmacy team to obtain insulin at a better price  3/25 - blood sugars improved, today 128  4/22 - Blood sugars range low 100's-140's.    5/20 - Report blood sugar today was 93,  state it has remained less than 200 since medication changes, Will continue to monitor and adhere to medications    . Set My Target A1C-Diabetes Type 2   On track    Timeframe:  Long-Range Goal Priority:  High Start Date:        5/20 (goal extended)              Expected End Date:     8/16  Barriers: Financial - working with pharmacy team for financial assistance           - set target A1C  - <7   Why is this important?    Your target A1C is decided together by you and your doctor.   It is based on several things like your age and other health issues.    Notes:   4/22 - Encouraged to reach out to [facility] pharmacist regarding cost of insulin.  This RNCM will continue collaboration with pharmacy team  5/20 - Advised to remain active with pharmacy team in effort to maintain affordable insulin       Kemper Durie, Charity fundraiser, MSN  [place] [phone]

## 2020-12-12 ENCOUNTER — Other Ambulatory Visit: Payer: Self-pay

## 2020-12-12 ENCOUNTER — Encounter (HOSPITAL_COMMUNITY): Payer: Self-pay | Admitting: Emergency Medicine

## 2020-12-12 ENCOUNTER — Emergency Department (HOSPITAL_COMMUNITY): Payer: 59

## 2020-12-12 ENCOUNTER — Emergency Department (HOSPITAL_COMMUNITY)
Admission: EM | Admit: 2020-12-12 | Discharge: 2020-12-13 | Disposition: A | Discharge status: Home / Self Care | Payer: 59 | Attending: Emergency Medicine | Admitting: Emergency Medicine

## 2020-12-12 DIAGNOSIS — U071 COVID-19: Secondary | ICD-10-CM | POA: Diagnosis not present

## 2020-12-12 DIAGNOSIS — R079 Chest pain, unspecified: Secondary | ICD-10-CM

## 2020-12-12 DIAGNOSIS — Z794 Long term (current) use of insulin: Secondary | ICD-10-CM | POA: Diagnosis not present

## 2020-12-12 DIAGNOSIS — E119 Type 2 diabetes mellitus without complications: Secondary | ICD-10-CM | POA: Insufficient documentation

## 2020-12-12 DIAGNOSIS — Z87891 Personal history of nicotine dependence: Secondary | ICD-10-CM | POA: Diagnosis not present

## 2020-12-12 DIAGNOSIS — R0602 Shortness of breath: Secondary | ICD-10-CM | POA: Diagnosis present

## 2020-12-12 LAB — CBC
HCT: 48.6 % (ref 39.0–52.0)
Hemoglobin: 16.2 g/dL (ref 13.0–17.0)
MCH: 31.1 pg (ref 26.0–34.0)
MCHC: 33.3 g/dL (ref 30.0–36.0)
MCV: 93.3 fL (ref 80.0–100.0)
Platelets: 169 10*3/uL (ref 150–400)
RBC: 5.21 MIL/uL (ref 4.22–5.81)
RDW: 13 % (ref 11.5–15.5)
WBC: 7.8 10*3/uL (ref 4.0–10.5)
nRBC: 0 % (ref 0.0–0.2)

## 2020-12-12 LAB — COMPREHENSIVE METABOLIC PANEL
ALT: 45 U/L — ABNORMAL HIGH (ref 0–44)
AST: 32 U/L (ref 15–41)
Albumin: 3.9 g/dL (ref 3.5–5.0)
Alkaline Phosphatase: 108 U/L (ref 38–126)
Anion gap: 9 (ref 5–15)
BUN: 7 mg/dL (ref 6–20)
CO2: 29 mmol/L (ref 22–32)
Calcium: 9.4 mg/dL (ref 8.9–10.3)
Chloride: 99 mmol/L (ref 98–111)
Creatinine, Ser: 0.9 mg/dL (ref 0.61–1.24)
GFR, Estimated: >60 mL/min (ref >60)
Glucose, Bld: 168 mg/dL — ABNORMAL HIGH (ref 70–99)
Potassium: 3.5 mmol/L (ref 3.5–5.1)
Sodium: 137 mmol/L (ref 135–145)
Total Bilirubin: 0.5 mg/dL (ref 0.3–1.2)
Total Protein: 7.8 g/dL (ref 6.5–8.1)

## 2020-12-12 LAB — TROPONIN I (HIGH SENSITIVITY): Troponin I (High Sensitivity): 6 ng/L (ref <18)

## 2020-12-12 NOTE — ED Provider Notes (Signed)
Emergency Medicine Provider Triage Evaluation Note  Logan Weiss , a 50 y.o. male  was evaluated in triage.  Pt complains of pain and shortness of breath.  Patient was diagnosed with COVID 6 days ago.  His fevers have improved but he has had worsening chest pain and shortness of breath and was told by his PCP to come in for evaluation.  Review of Systems  Positive: Chest pain, shortness of breath Negative: Unilateral leg swelling  Physical Exam  BP 133/81 (BP Location: Right Arm)   Pulse 80   Temp 98.5 F (36.9 C) (Oral)   Resp 18   SpO2 97%  Gen:   Awake, no distress   Resp:  Normal effort  MSK:   Moves extremities without difficulty  Other:  No respiratory distress  Medical Decision Making  Medically screening exam initiated at 9:05 PM.  Appropriate orders placed.  Logan Weiss was informed that the remainder of the evaluation will be completed by another provider, this initial triage assessment does not replace that evaluation, and the importance of remaining in the ED until their evaluation is complete.  COVID-positive, labs and imaging ordered   Arthor Captain, Cordelia Poche 12/12/20 2106    LongArlyss Repress, MD 12/12/20 [id number]

## 2020-12-12 NOTE — ED Triage Notes (Signed)
Patient states test positive for COVID 12/07/2020 and continue to have chest pain and shortness of breath. Had a e-visit instructed patient to go to the ED for evaluation.

## 2020-12-13 LAB — TROPONIN I (HIGH SENSITIVITY): Troponin I (High Sensitivity): 6 ng/L (ref <18)

## 2020-12-13 NOTE — Discharge Instructions (Signed)
Try pepcid or tagamet up to twice a day.  Try to avoid things that may make this worse, most commonly these are spicy foods tomato based products fatty foods chocolate and peppermint.  Alcohol and tobacco can also make this worse.  Return to the emergency department for sudden worsening pain fever or inability to eat or drink.  

## 2020-12-13 NOTE — ED Provider Notes (Signed)
[name] EMERGENCY DEPARTMENT Provider Note   CSN: [id number] Arrival date & time: 12/12/20  1832     History Chief Complaint  Patient presents with  . Chest Pain  . Shortness of Breath  . Covid Positive    Logan Weiss is a 50 y.o. male.  50 yo M with a cc of chest pain.  Has has covid for about 6 days.  Saw his doc virtually.  Recommended he come to the ED for ACS rule out.  He has a discomfort to the center of his chest.  Nothing seems to make this better or worse.  He has been coughing but that is gotten a bit better.  Has had some posterior nasal drip and sore throat.  No nausea vomiting no loss of smell.  Denies history of MI.  Has a history of hypertension hyperlipidemia and diabetes.  Denies smoking denies family history of MI.  The history is provided by the patient.  Chest Pain Pain location:  Substernal area Pain quality: aching   Pain radiates to:  Does not radiate Pain severity:  Moderate Onset quality:  Gradual Duration:  2 days Timing:  Constant Progression:  Worsening Chronicity:  New Relieved by:  Nothing Worsened by:  Nothing Ineffective treatments:  None tried Associated symptoms: cough and shortness of breath   Associated symptoms: no abdominal pain, no fever, no headache, no palpitations and no vomiting   Shortness of Breath Associated symptoms: chest pain and cough   Associated symptoms: no abdominal pain, no fever, no headaches, no rash and no vomiting        Past Medical History:  Diagnosis Date  . Cellulitis 11/2017   of face   . Diabetes (HCC)    new dx  . History of MRSA infection     Patient Active Problem List   Diagnosis Date Noted  . Fever 10/11/2019  . Elevated liver enzymes 10/11/2019  . Sepsis (HCC) 10/10/2019  . Hyperglycemia   . Normocytic anemia 09/30/2019  . Necrotizing soft tissue infection 09/30/2019  . Abscess of multiple sites of perineum 09/27/2019  . Type 2 diabetes mellitus with  hyperglycemia (HCC) 11/05/2017  . 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 History of MRSA infection 06/08/2014    Past Surgical History:  Procedure Laterality Date  . APPENDECTOMY    . INCISION AND DRAINAGE PERIRECTAL ABSCESS Right 09/27/2019   Procedure: IRRIGATION AND DEBRIDEMENT RIGHT THIGH, PERINEUM, AND SCROTUM;  Surgeon: Diamantina Monks, MD;  Location: [facility] OR;  Service: General;  Laterality: Right;  . IRRIGATION AND DEBRIDEMENT ABSCESS Right 09/29/2019   Procedure: IRRIGATION AND DEBRIDEMENT Right perineum and thigh;  Surgeon: Emelia Loron, MD;  Location: [facility] OR;  Service: General;  Laterality: Right;  . right knee surgery         Family History  Problem Relation Age of Onset  . Hyperlipidemia Mother   . Mental retardation Mother   . Diabetes Mother   . Diabetes Father   . Hyperlipidemia Father   . Hypertension Father   . Diabetes Maternal Grandmother   . Diabetes Maternal Grandfather   . Diabetes Paternal Grandmother   . Diabetes Paternal Grandfather     Social History   Tobacco Use  . Smoking status: Former Smoker    Packs/day: 0.00    Years: 5.00    Pack years: 0.00    Types: Cigarettes    Quit date: 09/18/2019    Years since quitting: 1.2  .  Smokeless tobacco: Never Used  Vaping Use  . Vaping Use: Never used  Substance Use Topics  . Alcohol use: No    Alcohol/week: 0.0 standard drinks    Comment: socially  . Drug use: No    Home Medications Prior to Admission medications   Medication Sig Start Date End Date Taking? Authorizing Provider  ascorbic acid (VITAMIN C) 500 MG tablet Take 500 mg by mouth daily.    [provider]  atorvastatin (LIPITOR) 20 MG tablet Take 20 mg by mouth daily. 09/04/19   [provider]  Cholecalciferol (VITAMIN D-400 PO) Take by mouth.    [provider]  GlucoCom Lancets MISC Check blood sugar TID & QHS 06/22/14   Advani, Ayesha Rumpfeepak, MD  glucose blood (CHOICE DM FORA  G20 TEST STRIPS) test strip Use as instructed 06/22/14   Advani, Ayesha Rumpfeepak, MD  insulin aspart (NOVOLOG) 100 UNIT/ML FlexPen Inject 12 Units into the skin 3 (three) times daily with meals. Patient taking differently: Inject 8 Units into the skin 3 (three) times daily with meals. 10/04/19   Seawell, Jaimie A, DO  insulin glargine (LANTUS) 100 UNIT/ML Solostar Pen Inject 35 Units into the skin at bedtime. Patient taking differently: Inject 27 Units into the skin at bedtime. 10/04/19   Seawell, Jaimie A, DO  Insulin Pen Needle 32G X 4 MM MISC use to inject insulin 4 times a day 10/23/19   Seawell, Jaimie A, DO  Vitamin D, Cholecalciferol, 10 MCG (400 UNIT) CAPS Take by mouth.    [provider]  vitamin E 1000 UNIT capsule Take 1,000 Units by mouth daily.    [provider]  zinc gluconate 50 MG tablet Take 50 mg by mouth daily.    [provider]    Allergies    Metformin and related and Doxycycline  Review of Systems   Review of Systems  Constitutional: Negative for chills and fever.  HENT: Negative for congestion and facial swelling.   Eyes: Negative for discharge and visual disturbance.  Respiratory: Positive for cough and shortness of breath.   Cardiovascular: Positive for chest pain. Negative for palpitations.  Gastrointestinal: Negative for abdominal pain, diarrhea and vomiting.  Musculoskeletal: Negative for arthralgias and myalgias.  Skin: Negative for color change and rash.  Neurological: Negative for tremors, syncope and headaches.  Psychiatric/Behavioral: Negative for confusion and dysphoric mood.    Physical Exam Updated Vital Signs BP (!) 148/97 (BP Location: Left Arm)   Pulse 80   Temp 98.4 F (36.9 C) (Oral)   Resp 18   Ht 5\' 6"  (1.676 m)   Wt 100.7 kg   SpO2 97%   BMI 35.83 kg/m   Physical Exam Vitals and nursing note reviewed.  Constitutional:      Appearance: He is well-developed.  HENT:     Head: Normocephalic and atraumatic.  Eyes:      Pupils: Pupils are equal, round, and reactive to light.  Neck:     Vascular: No JVD.  Cardiovascular:     Rate and Rhythm: Normal rate and regular rhythm.     Heart sounds: No murmur heard. No friction rub. No gallop.   Pulmonary:     Effort: No respiratory distress.     Breath sounds: No wheezing.  Abdominal:     General: There is no distension.     Tenderness: There is no guarding or rebound.  Musculoskeletal:        General: Normal range of motion.     Cervical  back: Normal range of motion and neck supple.  Skin:    Coloration: Skin is not pale.     Findings: No rash.  Neurological:     Mental Status: He is alert and oriented to person, place, and time.  Psychiatric:        Behavior: Behavior normal.     ED Results / Procedures / Treatments   Labs (all labs ordered are listed, but only abnormal results are displayed) Labs Reviewed  COMPREHENSIVE METABOLIC PANEL - Abnormal; Notable for the following components:      Result Value   Glucose, Bld 168 (*)    ALT 45 (*)    All other components within normal limits  CBC  TROPONIN I (HIGH SENSITIVITY)  TROPONIN I (HIGH SENSITIVITY)    EKG EKG Interpretation  Date/Time:  Monday December 12 2020 21:05:20 EDT Ventricular Rate:  80 PR Interval:  150 QRS Duration: 80 QT Interval:  332 QTC Calculation: 382 R Axis:   5 Text Interpretation: Normal sinus rhythm with sinus arrhythmia Minimal voltage criteria for LVH, may be normal variant ( R in aVL ) T wave abnormality, consider inferior ischemia Abnormal ECG flipped t wave in lead III seen on prior though more pronounced Otherwise no significant change Confirmed by Melene Plan [phone]) on 12/13/2020 4:39:05 AM   Radiology DG Chest Portable 1 View  Result Date: 12/12/2020 CLINICAL DATA:  Dyspnea EXAM: PORTABLE CHEST 1 VIEW COMPARISON:  10/10/2019 FINDINGS: The heart size and mediastinal contours are within normal limits. Both lungs are clear. The visualized skeletal structures  are unremarkable. IMPRESSION: No active disease. Electronically Signed   By: Helyn Numbers MD   On: 12/12/2020 22:10    Procedures Procedures   Medications Ordered in ED Medications - No data to display  ED Course  I have reviewed the triage vital signs and the nursing notes.  Pertinent labs & imaging results that were available during my care of the patient were reviewed by me and considered in my medical decision making (see chart for details).    MDM Rules/Calculators/A&P                          50 yo M with a chief complaint of chest pain.  This is atypical in nature going on for a few days now in conjunction with the coronavirus.  There is likely the patient has chest wall pain from coughing or potentially reflux from posterior nasal drip.  He is well-appearing nontoxic.  EKG without any significant change from baseline.  His troponins are negative x2.  Chest x-ray viewed by me without focal infiltrate or pneumothorax.    Feel no further work-up is needed.  No significant anemia no significant electrolyte abnormality. Completely atypical of PE. Will discharge patient home.  PCP follow-up.  5:01 AM:  I have discussed the diagnosis/risks/treatment options with the patient and believe the pt to be eligible for discharge home to follow-up with PCP. We also discussed returning to the ED immediately if new or worsening sx occur. We discussed the sx which are most concerning (e.g., sudden worsening pain, fever, inability to tolerate by mouth) that necessitate immediate return. Medications administered to the patient during their visit and any new prescriptions provided to the patient are listed below.  Medications given during this visit Medications - No data to display   The patient appears reasonably screen and/or stabilized for discharge and I doubt any other medical condition or other  EMC requiring further screening, evaluation, or treatment in the ED at this time prior to discharge.    Final Clinical Impression(s) / ED Diagnoses Final diagnoses:  Nonspecific chest pain    Rx / DC Orders ED Discharge Orders    None       Melene Plan, DO 12/13/20 0501

## 2021-02-21 ENCOUNTER — Ambulatory Visit: Payer: Self-pay | Admitting: *Deleted

## 2021-02-22 ENCOUNTER — Other Ambulatory Visit: Payer: Self-pay | Admitting: *Deleted

## 2021-02-22 NOTE — Patient Outreach (Signed)
[facility]) Care Management  02/22/2021  ROMARIO TITH 1971-01-03 [id number]   Outgoing call placed to member, no answer, HIPAA compliant voice message left. Will follow up within the next 3-4 business days.  Kemper Durie, [place], MSN [facility] Care Management  [facility] Manager [phone]

## 2021-02-28 ENCOUNTER — Other Ambulatory Visit: Payer: Self-pay | Admitting: *Deleted

## 2021-02-28 NOTE — Patient Outreach (Signed)
[facility]) Care Management  02/28/2021  Logan Weiss 09-23-1970 [id number]   Outreach attempt #2, unsuccessful, unable to leave voice message as mailbox is full.  Will send outreach letter and follow up within the next 3-4 business days.  Kemper Durie, [place], MSN [facility] Care Management  [facility] Manager [phone]

## 2021-03-03 ENCOUNTER — Other Ambulatory Visit: Payer: Self-pay | Admitting: *Deleted

## 2021-03-03 NOTE — Patient Outreach (Signed)
[facility]) Care Management  03/03/2021  Logan Weiss 1971-01-21 [id number]   Outreach attempt #3, unsuccessful, unable to leave voice message as mailbox is full.  Will make 4th and final attempt within the next 4 weeks.  If remain unsuccessful, will close case due to inability to maintain contact.  Kemper Durie, [place], MSN [facility] Care Management  [facility] Manager [phone]

## 2021-03-31 ENCOUNTER — Other Ambulatory Visit: Payer: Self-pay | Admitting: *Deleted

## 2021-03-31 NOTE — Patient Outreach (Signed)
[facility]) Care Management  03/31/2021  Logan Weiss Nov 07, 1970 [id number]   Outreach attempt #4, initially unsuccessful however member returned call.  Denies any urgent concerns, encouraged to contact this care manager with questions.  Will place referral to health coach for ongoing disease management.   Goals Addressed             This Visit's Progress    COMPLETED: Monitor and Manage My Blood Sugar-Diabetes Type 2   On track    Timeframe:  Short-Term Goal Priority:  Medium Start Date:        5/20  (goal extended,medication changes)                Expected End Date:     6/20               Barriers: Knowledge  - check blood sugar at prescribed times - check blood sugar if I feel it is too high or too low - enter blood sugar readings and medication or insulin into daily log    Why is this important?   Checking your blood sugar at home helps to keep it from getting very high or very low.  Writing the results in a diary or log helps the doctor know how to care for you.  Your blood sugar log should have the time, date and the results.  Also, write down the amount of insulin or other medicine that you take.  Other information, like what you ate, exercise done and how you were feeling, will also be helpful.     Notes:   2/22 - Confirmed member is working with [facility] pharmacy team to obtain insulin at a better price  3/25 - blood sugars improved, today 128  4/22 - Blood sugars range low 100's-140's.    5/20 - Report blood sugar today was 93, state it has remained less than 200 since medication changes, Will continue to monitor and adhere to medications     Set My Target A1C-Diabetes Type 2   On track    Timeframe:  Long-Range Goal Priority:  High Start Date:        9/23 (goal reset)              Expected End Date:     12/23  Barriers: Financial - working with pharmacy team for financial assistance           - set target A1C  - <7   Why is this  important?   Your target A1C is decided together by you and your doctor.  It is based on several things like your age and other health issues.    Notes:   4/22 - Encouraged to reach out to [facility] pharmacist regarding cost of insulin.  This RNCM will continue collaboration with pharmacy team  5/20 - Advised to remain active with pharmacy team in effort to maintain affordable insulin  9/23 - A1C decreased to 7.6, but remains higher than goal.  Will continue to work on diet and exercise.  Open to ongoing disease management with health coach       Kemper Durie, RN, MSN [facility] Care Management  [facility] Manager [phone]

## 2021-04-11 ENCOUNTER — Other Ambulatory Visit: Payer: Self-pay | Admitting: *Deleted

## 2021-04-11 NOTE — Patient Outreach (Signed)
[facility]) Care Management  04/11/2021  Logan Weiss December 23, 1970 [id number]   Notified that member is no longer eligible for [facility] services as PCP is with [facility].  Call placed to notify member, no answer, HIPAA compliant voice message left.  Will send case closure letter to member and PCP.  Kemper Durie, [place], MSN [facility] Care Management  [facility] Manager [phone]

## 2021-11-19 ENCOUNTER — Other Ambulatory Visit: Payer: Self-pay

## 2021-11-19 ENCOUNTER — Encounter (HOSPITAL_COMMUNITY): Payer: Self-pay

## 2021-11-19 ENCOUNTER — Emergency Department (HOSPITAL_COMMUNITY)
Admission: EM | Admit: 2021-11-19 | Discharge: 2021-11-20 | Disposition: A | Discharge status: Home / Self Care | Payer: BLUE CROSS/BLUE SHIELD | Attending: Emergency Medicine | Admitting: Emergency Medicine

## 2021-11-19 ENCOUNTER — Encounter (HOSPITAL_COMMUNITY): Payer: Self-pay | Admitting: Emergency Medicine

## 2021-11-19 ENCOUNTER — Emergency Department (HOSPITAL_COMMUNITY): Payer: BLUE CROSS/BLUE SHIELD

## 2021-11-19 ENCOUNTER — Ambulatory Visit (HOSPITAL_COMMUNITY)
Admission: EM | Admit: 2021-11-19 | Discharge: 2021-11-19 | Disposition: A | Discharge status: Home / Self Care | Payer: BLUE CROSS/BLUE SHIELD | Attending: Family Medicine | Admitting: Family Medicine

## 2021-11-19 DIAGNOSIS — R0981 Nasal congestion: Secondary | ICD-10-CM | POA: Diagnosis not present

## 2021-11-19 DIAGNOSIS — X58XXXA Exposure to other specified factors, initial encounter: Secondary | ICD-10-CM | POA: Insufficient documentation

## 2021-11-19 DIAGNOSIS — T18128A Food in esophagus causing other injury, initial encounter: Secondary | ICD-10-CM | POA: Diagnosis not present

## 2021-11-19 DIAGNOSIS — R739 Hyperglycemia, unspecified: Secondary | ICD-10-CM | POA: Diagnosis not present

## 2021-11-19 DIAGNOSIS — T18108A Unspecified foreign body in esophagus causing other injury, initial encounter: Secondary | ICD-10-CM

## 2021-11-19 DIAGNOSIS — Z794 Long term (current) use of insulin: Secondary | ICD-10-CM | POA: Insufficient documentation

## 2021-11-19 DIAGNOSIS — R1310 Dysphagia, unspecified: Secondary | ICD-10-CM | POA: Diagnosis present

## 2021-11-19 DIAGNOSIS — Z20822 Contact with and (suspected) exposure to covid-19: Secondary | ICD-10-CM | POA: Diagnosis not present

## 2021-11-19 LAB — CBC WITH DIFFERENTIAL/PLATELET
Abs Immature Granulocytes: 0.02 10*3/uL (ref 0.00–0.07)
Basophils Absolute: 0 10*3/uL (ref 0.0–0.1)
Basophils Relative: 1 %
Eosinophils Absolute: 0.1 10*3/uL (ref 0.0–0.5)
Eosinophils Relative: 2 %
HCT: 44.7 % (ref 39.0–52.0)
Hemoglobin: 14.8 g/dL (ref 13.0–17.0)
Immature Granulocytes: 0 %
Lymphocytes Relative: 31 %
Lymphs Abs: 1.9 10*3/uL (ref 0.7–4.0)
MCH: 31.3 pg (ref 26.0–34.0)
MCHC: 33.1 g/dL (ref 30.0–36.0)
MCV: 94.5 fL (ref 80.0–100.0)
Monocytes Absolute: 0.3 10*3/uL (ref 0.1–1.0)
Monocytes Relative: 6 %
Neutro Abs: 3.7 10*3/uL (ref 1.7–7.7)
Neutrophils Relative %: 60 %
Platelets: 190 10*3/uL (ref 150–400)
RBC: 4.73 MIL/uL (ref 4.22–5.81)
RDW: 13.6 % (ref 11.5–15.5)
WBC: 6.1 10*3/uL (ref 4.0–10.5)
nRBC: 0 % (ref 0.0–0.2)

## 2021-11-19 LAB — RESP PANEL BY RT-PCR (FLU A&B, COVID) ARPGX2
Influenza A by PCR: NEGATIVE
Influenza B by PCR: NEGATIVE
SARS Coronavirus 2 by RT PCR: NEGATIVE

## 2021-11-19 LAB — BASIC METABOLIC PANEL
Anion gap: 10 (ref 5–15)
BUN: 8 mg/dL (ref 6–20)
CO2: 26 mmol/L (ref 22–32)
Calcium: 9.3 mg/dL (ref 8.9–10.3)
Chloride: 102 mmol/L (ref 98–111)
Creatinine, Ser: 0.89 mg/dL (ref 0.61–1.24)
GFR, Estimated: >60 mL/min (ref >60)
Glucose, Bld: 246 mg/dL — ABNORMAL HIGH (ref 70–99)
Potassium: 3.3 mmol/L — ABNORMAL LOW (ref 3.5–5.1)
Sodium: 138 mmol/L (ref 135–145)

## 2021-11-19 MED ORDER — SODIUM CHLORIDE 0.9 % IV SOLN: INTRAVENOUS | Status: DC

## 2021-11-19 MED ORDER — GLUCAGON HCL RDNA (DIAGNOSTIC) 1 MG IJ SOLR
1.0000 mg | Freq: Once | INTRAMUSCULAR | Status: DC
  Filled 2021-11-19: qty 1

## 2021-11-19 NOTE — ED Triage Notes (Signed)
Pt presents after getting a piece of pork chop stuck in throat. Pt able to speak in full sentences. States it feels difficult to breath. Sating 98% on room air.  ?

## 2021-11-19 NOTE — ED Provider Notes (Signed)
?[name] EMERGENCY DEPARTMENT ?Provider Note ? ? ?CSN: [id number] ?Arrival date & time: 11/19/21  1702 ? ?  ? ?History ? ?Chief Complaint  ?Patient presents with  ? Swallowed Foreign Body  ? ? ?Logan Weiss is a 51 y.o. male. ? ?Patient was sent to the emergency department from urgent care with difficulty swallowing.  Patient reports that he was eating dinner tonight and ate a piece of pork chop and it has been stuck in his throat and is unable to swallow spit since then.  He states that he has a history of food getting stuck in his throat and he was supposed to see a gastroenterologist about this but the co-pay was too high so he has not been able to do this.  He reports that when food normally gets stuck in his throat he was able to eat crackers and it will cause it to goes down but did not work this time.  He has been retching/coughing/dry heaving but has been unable to get anything out. ? ? ?  ? ?Home Medications ?Prior to Admission medications   ?Medication Sig Start Date End Date Taking? Authorizing Provider  ?ascorbic acid (VITAMIN C) 500 MG tablet Take 500 mg by mouth daily.    [provider]  ?atorvastatin (LIPITOR) 20 MG tablet Take 20 mg by mouth daily. 09/04/19   [provider]  ?Cholecalciferol (VITAMIN D-400 PO) Take by mouth.    [provider]  ?GlucoCom Lancets MISC Check blood sugar TID & QHS 06/22/14   Advani, Ayesha Rumpf, MD  ?glucose blood (CHOICE DM FORA G20 TEST STRIPS) test strip Use as instructed 06/22/14   Advani, Ayesha Rumpf, MD  ?insulin aspart (NOVOLOG) 100 UNIT/ML FlexPen Inject 12 Units into the skin 3 (three) times daily with meals. ?Patient taking differently: Inject 8 Units into the skin 3 (three) times daily with meals. 10/04/19   Seawell, Jaimie A, DO  ?insulin glargine (LANTUS) 100 UNIT/ML Solostar Pen Inject 35 Units into the skin at bedtime. ?Patient taking differently: Inject 27 Units into the skin at bedtime. 10/04/19   Seawell, Jaimie A,  DO  ?Insulin Pen Needle 32G X 4 MM MISC use to inject insulin 4 times a day 10/23/19   Seawell, Jaimie A, DO  ?Vitamin D, Cholecalciferol, 10 MCG (400 UNIT) CAPS Take by mouth.    [provider]  ?vitamin E 1000 UNIT capsule Take 1,000 Units by mouth daily.    [provider]  ?zinc gluconate 50 MG tablet Take 50 mg by mouth daily.    [provider]  ?   ? ?Allergies    ?Metformin and related and Doxycycline   ? ?Review of Systems   ?Review of Systems  ?Constitutional:  Negative for chills and fever.  ?HENT:  Negative for congestion and rhinorrhea.   ?Eyes:  Negative for visual disturbance.  ?Respiratory:  Positive for choking. Negative for cough and shortness of breath.   ?Cardiovascular:  Negative for chest pain.  ?Gastrointestinal:  Positive for nausea and vomiting. Negative for abdominal pain, constipation and diarrhea.  ?Endocrine: Negative for polyuria.  ?Genitourinary:  Negative for decreased urine volume and dysuria.  ?Skin:  Negative for rash.  ?Neurological:  Negative for weakness and headaches.  ?All other systems reviewed and are negative. ? ?Physical Exam ?Updated Vital Signs ?BP (!) 144/100   Pulse 84   Temp 98.8 ?F (37.1 ?C) (Oral)   Resp 20   Ht 5\' 6"  (1.676 m)   Wt  99 kg   SpO2 100%   BMI 35.23 kg/m?  ?Physical Exam ?Vitals and nursing note reviewed.  ?Constitutional:   ?   General: He is not in acute distress. ?   Appearance: He is well-developed.  ?HENT:  ?   Head: Normocephalic and atraumatic.  ?   Nose: Congestion present.  ?   Mouth/Throat:  ?   Mouth: Mucous membranes are moist.  ?   Comments: Patient with considerable amount of saliva, unable to swallow any of this. ?Eyes:  ?   Conjunctiva/sclera: Conjunctivae normal.  ?Cardiovascular:  ?   Rate and Rhythm: Normal rate and regular rhythm.  ?   Heart sounds: No murmur heard. ?Pulmonary:  ?   Effort: Pulmonary effort is normal. No respiratory distress.  ?   Breath sounds: Normal breath sounds.  ?Abdominal:  ?    Palpations: Abdomen is soft.  ?   Tenderness: There is no abdominal tenderness.  ?Musculoskeletal:     ?   General: No swelling.  ?   Cervical back: Neck supple.  ?Skin: ?   General: Skin is warm and dry.  ?   Capillary Refill: Capillary refill takes less than 2 seconds.  ?Neurological:  ?   Mental Status: He is alert.  ?Psychiatric:     ?   Mood and Affect: Mood normal.  ? ? ?ED Results / Procedures / Treatments   ?Labs ?(all labs ordered are listed, but only abnormal results are displayed) ?Labs Reviewed  ?BASIC METABOLIC PANEL - Abnormal; Notable for the following components:  ?    Result Value  ? Potassium 3.3 (*)   ? Glucose, Bld 246 (*)   ? All other components within normal limits  ?RESP PANEL BY RT-PCR (FLU A&B, COVID) [id number]  ?CBC WITH DIFFERENTIAL/PLATELET  ? ? ?EKG ?None ? ?Radiology ?DG Chest 2 View ? ?Result Date: 11/19/2021 ?CLINICAL DATA:  suspected esophageal impaction, patient reports shortness of breath EXAM: CHEST - 2 VIEW COMPARISON:  Chest radiograph dated December 12, 2020; CT dated April 13, 2018 FINDINGS: The cardiomediastinal silhouette is normal in contour. No pleural effusion. No pneumothorax. No acute pleuroparenchymal abnormality. Visualized abdomen is unremarkable. Unchanged mild wedging of a vertebral body at the thoracolumbar junction. IMPRESSION: No acute cardiopulmonary abnormality. Electronically Signed   By: Meda Klinefelter M.D.   On: 11/19/2021 18:47   ? ?Procedures ?Procedures  ? ? ?Medications Ordered in ED ?Medications  ?glucagon (human recombinant) (GLUCAGEN) injection 1 mg (1 mg Intravenous Not Given 11/19/21 2206)  ?0.9 %  sodium chloride infusion (has no administration in time range)  ? ? ?ED Course/ Medical Decision Making/ A&P ?  ?                        ?Medical Decision Making ?Patient with difficulty swallowing while eating dinner tonight.  Feels like a piece of pork chop is stuck in his throat.  Is unable to swallow saliva.  Normal work of breathing on evaluation.   Vital signs within normal limit.  Likely because of inability to swallow his stricture.  Discussed case with on-call GI provider who reports they will set patient up for endoscopy in the morning and to monitor overnight in the emergency department.  Lab work within normal limits other than elevated blood sugar at 246.  Patient started on normal saline drip.  Patient handed off to nighttime provider. ? ? ?Final Clinical Impression(s) / ED Diagnoses ?Final diagnoses:  ?None  ? ? ?  Rx / DC Orders ?ED Discharge Orders   ? ? None  ? ?  ? ? ?  ?Derrel Nipresenzo, Victor, MD ?11/19/21 2243 ? ?  ?Blane OharaZavitz, Joshua, MD ?11/20/21 0018 ? ?

## 2021-11-19 NOTE — ED Notes (Signed)
Patient is being discharged from the Urgent Care and sent to the Emergency Department via POV . Per Dr.Banister, patient is in need of higher level of care due to complexity of care and need for futher evaluation.  Patient is aware and verbalizes understanding of plan of care.  ?Vitals:  ? 11/19/21 1650  ?BP: (!) 133/91  ?Pulse: 87  ?Resp: 20  ?SpO2: 98%  ?  ?

## 2021-11-19 NOTE — ED Triage Notes (Signed)
Pt arrived POV from urgent care stating he was eating a pork chop and swallowed a piece with bone and now it is stuck. Pt unable to speak, endorses SHOB, pt is gagging and spitting up a lot.  ?

## 2021-11-19 NOTE — ED Provider Triage Note (Signed)
Emergency Medicine Provider Triage Evaluation Note ? ?Logan Weiss , a 51 y.o. male  was evaluated in triage.  Pt complains of concern for esophageal food obstruction. ?He was eating meat and felt like it got stuck.  He went to urgent care who referred him here. ?He feels like he has been becoming short of breath since.  He is able to speak in complete sentences ? ? ? ?Physical Exam  ?BP (!) 158/97 (BP Location: Left Arm)   Pulse 95   Temp 98.8 ?F (37.1 ?C) (Oral)   Resp 20   Ht 5\' 6"  (1.676 m)   Wt 99 kg   SpO2 100%   BMI 35.23 kg/m?  ?Gen:   Awake spitting secretions into a bag ?Resp:  Normal effort, lung sounds present bilaterally without stridor ?MSK:   Moves extremities without difficulty  ?Other:  Not tolerating his saliva,  ? ?Medical Decision Making  ?Medically screening exam initiated at 6:12 PM.  Appropriate orders placed.  Logan Weiss was informed that the remainder of the evaluation will be completed by another provider, this initial triage assessment does not replace that evaluation, and the importance of remaining in the ED until their evaluation is complete. ? ?Patient clinically has a esophageal food obstruction. ?Given his reports of feeling short of breath we will check an exercise chest x-ray, however as his breath sounds are present bilaterally without stridor and clinically patient does not have evidence of airway obstruction. ? ?Orders for basic labs, flu and COVID test, chest x-ray, saline lock and IV glucagon is placed.   ? ? ?  ?Logan Weiss, Cristina Gong ?11/19/21 1815 ? ?

## 2021-11-19 NOTE — ED Provider Notes (Signed)
?[facility] ? ? ? ?CSN: [id number] ?Arrival date & time: 11/19/21  1638 ? ? ?  ? ?History   ?Chief Complaint ?Chief Complaint  ?Patient presents with  ? Food Object Stuck in Throat  ? ? ?HPI ?Logan Weiss is a 51 y.o. male.  ? ?HPI ?Here for having a pork chop bone stuck in his throat.  He was eating an hour or 2 ago and when he was swallowing a bite of pork chop he realized he was swallowing a piece of bone and now it is stuck and he cannot get it up.  He has been retching, but cannot throw up any fluid or liquid or food.  He does feel short of breath. ? ?Past Medical History:  ?Diagnosis Date  ? Cellulitis 11/2017  ? of face   ? Diabetes (HCC)   ? new dx  ? History of MRSA infection   ? ? ?Patient Active Problem List  ? Diagnosis Date Noted  ? Fever 10/11/2019  ? Elevated liver enzymes 10/11/2019  ? Sepsis (HCC) 10/10/2019  ? Hyperglycemia   ? Normocytic anemia 09/30/2019  ? Necrotizing soft tissue infection 09/30/2019  ? Abscess of multiple sites of perineum 09/27/2019  ? Type 2 diabetes mellitus with hyperglycemia (HCC) 11/05/2017  ? 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 History of MRSA infection 06/08/2014  ? ? ?Past Surgical History:  ?Procedure Laterality Date  ? APPENDECTOMY    ? INCISION AND DRAINAGE PERIRECTAL ABSCESS Right 09/27/2019  ? Procedure: IRRIGATION AND DEBRIDEMENT RIGHT THIGH, PERINEUM, AND SCROTUM;  Surgeon: Diamantina MonksLovick, Ayesha N, MD;  Location: [facility] OR;  Service: General;  Laterality: Right;  ? IRRIGATION AND DEBRIDEMENT ABSCESS Right 09/29/2019  ? Procedure: IRRIGATION AND DEBRIDEMENT Right perineum and thigh;  Surgeon: Emelia LoronWakefield, Matthew, MD;  [facility];  Service: General;  Laterality: Right;  ? right knee surgery    ? ? ? ? ? ?Home Medications   ? ?Prior to Admission medications   ?Medication Sig Start Date End Date Taking? Authorizing Provider  ?ascorbic acid (VITAMIN C) 500 MG tablet Take 500 mg by mouth daily.    [provider]   ?atorvastatin (LIPITOR) 20 MG tablet Take 20 mg by mouth daily. 09/04/19   [provider]  ?Cholecalciferol (VITAMIN D-400 PO) Take by mouth.    [provider]  ?GlucoCom Lancets MISC Check blood sugar TID & QHS 06/22/14   Advani, Ayesha Rumpfeepak, MD  ?glucose blood (CHOICE DM FORA G20 TEST STRIPS) test strip Use as instructed 06/22/14   Advani, Ayesha Rumpfeepak, MD  ?insulin aspart (NOVOLOG) 100 UNIT/ML FlexPen Inject 12 Units into the skin 3 (three) times daily with meals. ?Patient taking differently: Inject 8 Units into the skin 3 (three) times daily with meals. 10/04/19   Seawell, Jaimie A, DO  ?insulin glargine (LANTUS) 100 UNIT/ML Solostar Pen Inject 35 Units into the skin at bedtime. ?Patient taking differently: Inject 27 Units into the skin at bedtime. 10/04/19   Seawell, Jaimie A, DO  ?Insulin Pen Needle 32G X 4 MM MISC use to inject insulin 4 times a day 10/23/19   Seawell, Jaimie A, DO  ?Vitamin D, Cholecalciferol, 10 MCG (400 UNIT) CAPS Take by mouth.    [provider]  ?vitamin E 1000 UNIT capsule Take 1,000 Units by mouth daily.    [provider]  ?zinc gluconate 50 MG tablet Take 50 mg by mouth daily.    [provider]  ? ? ?Family  History ?Family History  ?Problem Relation Age of Onset  ? Hyperlipidemia Mother   ? Mental retardation Mother   ? Diabetes Mother   ? Diabetes Father   ? Hyperlipidemia Father   ? Hypertension Father   ? Diabetes Maternal Grandmother   ? Diabetes Maternal Grandfather   ? Diabetes Paternal Grandmother   ? Diabetes Paternal Grandfather   ? ? ?Social History ?Social History  ? ?Tobacco Use  ? Smoking status: Former  ?  Packs/day: 0.00  ?  Years: 5.00  ?  Pack years: 0.00  ?  Types: Cigarettes  ?  Quit date: 09/18/2019  ?  Years since quitting: 2.1  ? Smokeless tobacco: Never  ?Vaping Use  ? Vaping Use: Never used  ?Substance Use Topics  ? Alcohol use: No  ?  Alcohol/week: 0.0 standard drinks  ?  Comment: socially  ? Drug use: No  ? ? ? ?Allergies    ?Metformin and related and Doxycycline ? ? ?Review of Systems ?Review of Systems ? ? ?Physical Exam ?Triage Vital Signs ?ED Triage Vitals  ?Enc Vitals Group  ?   BP 11/19/21 1650 (!) 133/91  ?   Pulse Rate 11/19/21 1650 87  ?   Resp 11/19/21 1650 20  ?   Temp --   ?   Temp src --   ?   SpO2 11/19/21 1650 98 %  ?   Weight 11/19/21 1649 222 lb 0.1 oz (100.7 kg)  ?   Height 11/19/21 1649 5\' 6"  (1.676 m)  ?   Head Circumference --   ?   Peak Flow --   ?   Pain Score 11/19/21 1649 7  ?   Pain Loc --   ?   Pain Edu? --   ?   Excl. in GC? --   ? ?No data found. ? ?Updated Vital Signs ?BP (!) 133/91 (BP Location: Left Arm)   Pulse 87   Resp 20   Ht 5\' 6"  (1.676 m)   Wt 100.7 kg   SpO2 98%   BMI 35.83 kg/m?  ? ?Visual Acuity ?Right Eye Distance:   ?Left Eye Distance:   ?Bilateral Distance:   ? ?Right Eye Near:   ?Left Eye Near:    ?Bilateral Near:    ? ?Physical Exam ?Constitutional:   ?   Comments: Obviously uncomfortable, but breathing normally  ?HENT:  ?   Mouth/Throat:  ?   Mouth: Mucous membranes are moist.  ?   Pharynx: No oropharyngeal exudate or posterior oropharyngeal erythema.  ?Eyes:  ?   Extraocular Movements: Extraocular movements intact.  ?   Pupils: Pupils are equal, round, and reactive to light.  ?Cardiovascular:  ?   Rate and Rhythm: Normal rate and regular rhythm.  ?   Heart sounds: No murmur heard. ?Pulmonary:  ?   Effort: Pulmonary effort is normal.  ?   Breath sounds: Normal breath sounds. No stridor. No wheezing, rhonchi or rales.  ?Neurological:  ?   Mental Status: He is alert.  ? ? ? ?UC Treatments / Results  ?Labs ?(all labs ordered are listed, but only abnormal results are displayed) ?Labs Reviewed - No data to display ? ?EKG ? ? ?Radiology ?No results found. ? ?Procedures ?Procedures (including critical care time) ? ?Medications Ordered in UC ?Medications - No data to display ? ?Initial Impression / Assessment and Plan / UC Course  ?I have reviewed the triage vital signs and the nursing  notes. ? ?Pertinent labs &  imaging results that were available during my care of the patient were reviewed by me and considered in my medical decision making (see chart for details). ? ?  ? ?His vital signs are fairly reassuring.  He is not any respiratory distress, and has a normal lung exam.  It appears that this object is stuck in his esophagus.  I have asked him and his family to proceed to the emergency room for urgent evaluation and probably definitive treatment. ?Final Clinical Impressions(s) / UC Diagnoses  ? ?Final diagnoses:  ?Foreign body in esophagus, initial encounter  ? ? ? ?Discharge Instructions   ? ?  ?Please proceed to the ER ? ? ? ? ?ED Prescriptions   ?None ?  ? ?PDMP not reviewed this encounter. ?  ?Zenia Resides, MD ?11/19/21 1700 ? ?

## 2021-11-19 NOTE — Discharge Instructions (Addendum)
Please proceed to the ER 

## 2021-11-20 LAB — CBG MONITORING, ED
Glucose-Capillary: 146 mg/dL — ABNORMAL HIGH (ref 70–99)
Glucose-Capillary: 152 mg/dL — ABNORMAL HIGH (ref 70–99)

## 2021-11-20 MED ORDER — PANTOPRAZOLE SODIUM 20 MG PO TBEC: 20.0000 mg | DELAYED_RELEASE_TABLET | Freq: Every day | ORAL | 0 refills | Status: DC

## 2021-11-20 NOTE — ED Notes (Signed)
Pt stated that he thinks he was able to swallow the foreign body on his own. MD notified and instructed to give pt soda to fluid challenge pt. Pt currently drinking soda with no issues.  ?

## 2021-11-20 NOTE — ED Provider Notes (Signed)
I assumed care of this patient.  Please see previous provider note for further details of Hx, PE.  Briefly patient is a 51 y.o. male who presented for food bolus stuck in the distal esophagus.  GI consulted and planning to do EGD in the morning. ? ?During patient's stay his food bolus passed and he was able to tolerate p.o. ?Dr. Marina Goodell from GI was reconsulted and updated.  ?He agreed patient can be discharged home. ?Recommends PPI and clear liquid diet for the next 2 days. ?Outpatient follow-up. ? ?The patient appears reasonably screened and/or stabilized for discharge and I doubt any other medical condition or other [facility] requiring further screening, evaluation, or treatment in the ED at this time prior to discharge. Safe for discharge with strict return precautions. ? ?Disposition: Discharge ? ?Condition: Good ? ?I have discussed the results, Dx and Tx plan with the patient/family who expressed understanding and agree(s) with the plan. Discharge instructions discussed at length. The patient/family was given strict return precautions who verbalized understanding of the instructions. No further questions at time of discharge.  ? ? ?ED Discharge Orders   ? ?      Ordered  ?  pantoprazole (PROTONIX) 20 MG tablet  Daily       ? 11/20/21 0417  ? ?  ?  ? ?  ? ? ?Follow Up: ?Hilarie Fredrickson, MD ?[address] ?[place] [address] ?[phone] ? ?Call  ?to schedule an appointment for close follow up ? ?Malka So., MD ?[address] ?[address] ?[place] [address] ?[phone] ? ?Call  ?as needed ? ? ? ? ? ? ?  ?Nira Conn, MD ?11/20/21 [phone] ? ?

## 2022-08-13 IMAGING — CR DG CHEST 2V
2 series · 2 of 2 positions shown · non-contrast
Comparison: Chest radiograph dated December 12, 2020; CT dated April 13, 2018

CLINICAL DATA: suspected esophageal impaction, patient reports
shortness of breath

EXAM:
CHEST - 2 VIEW

[chest pa]
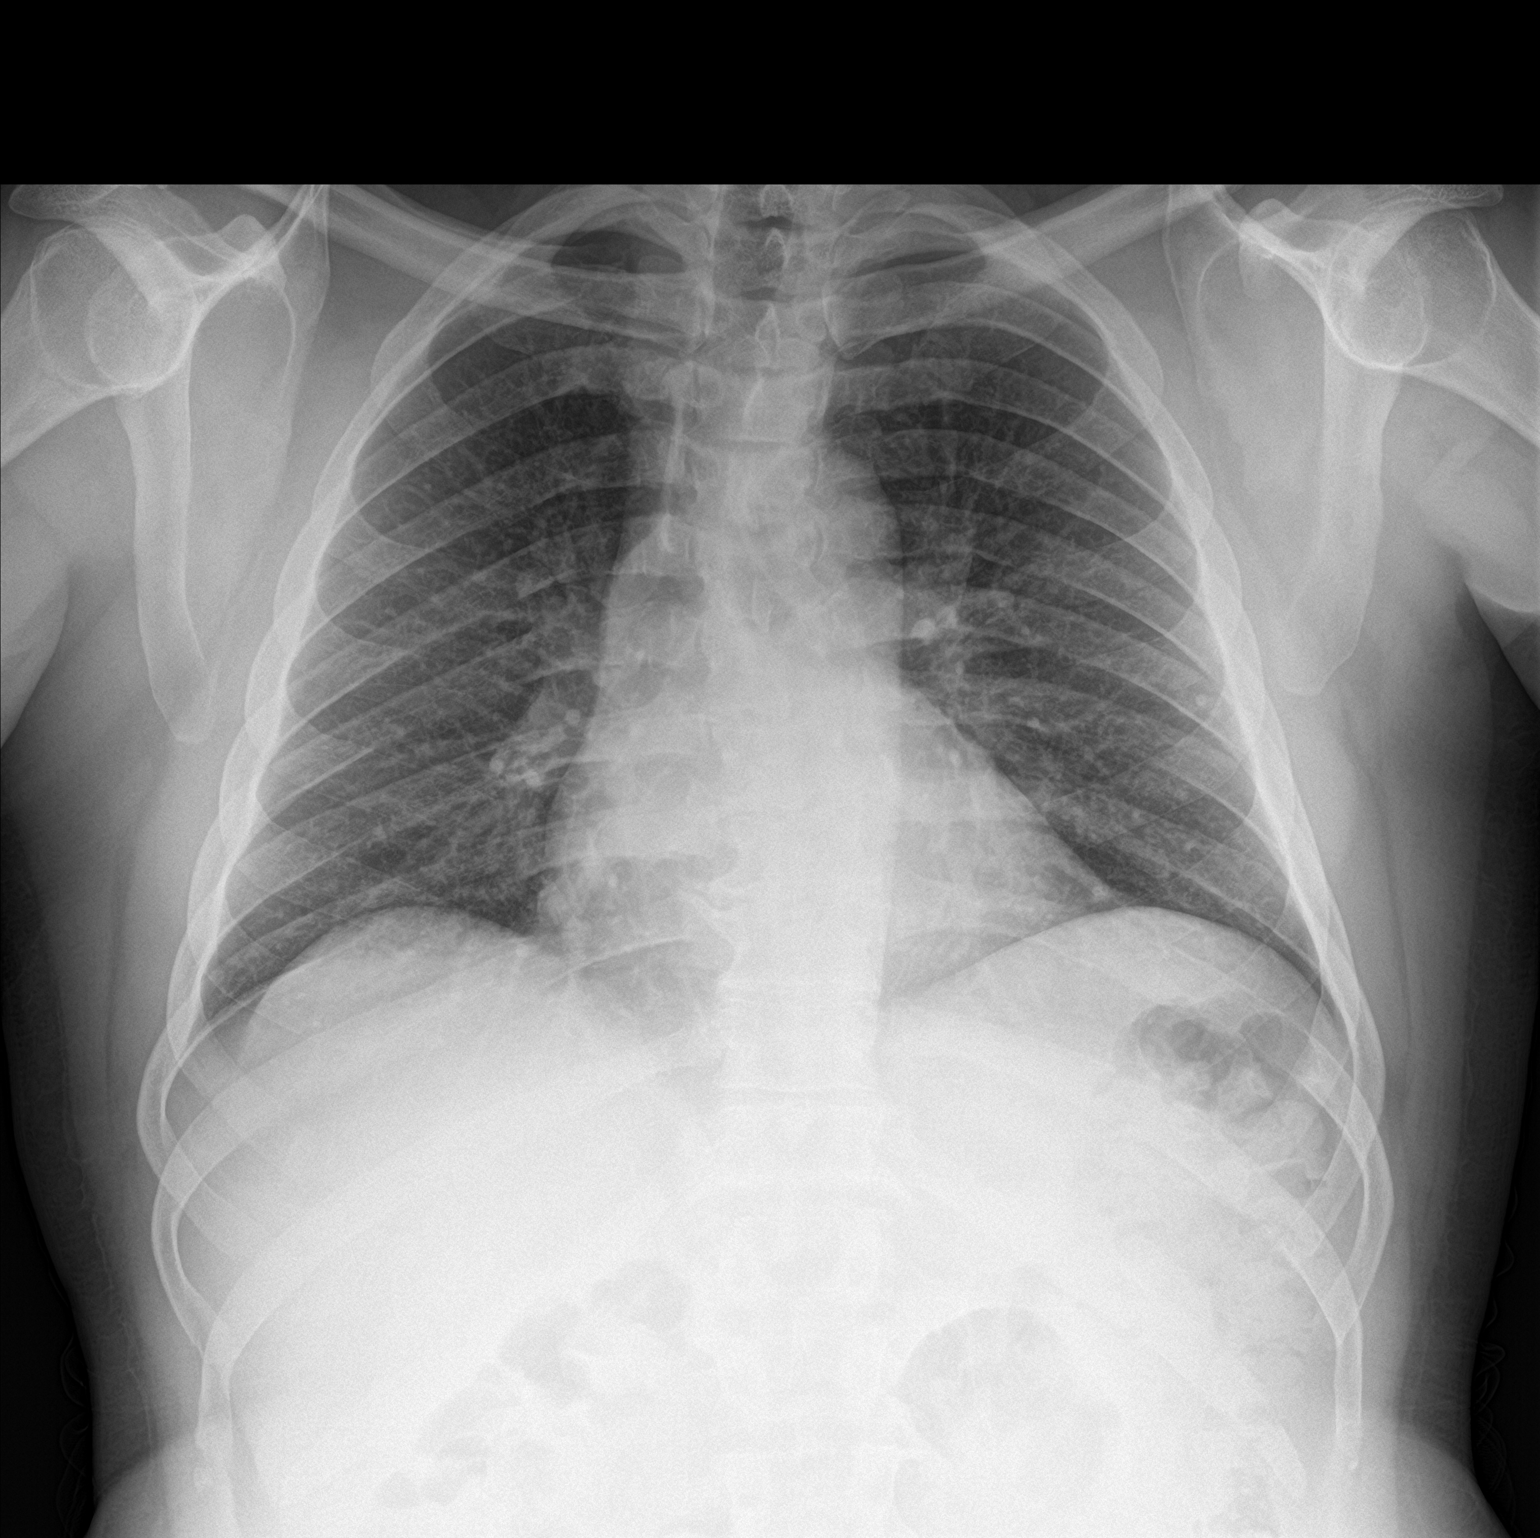

[chest lat]
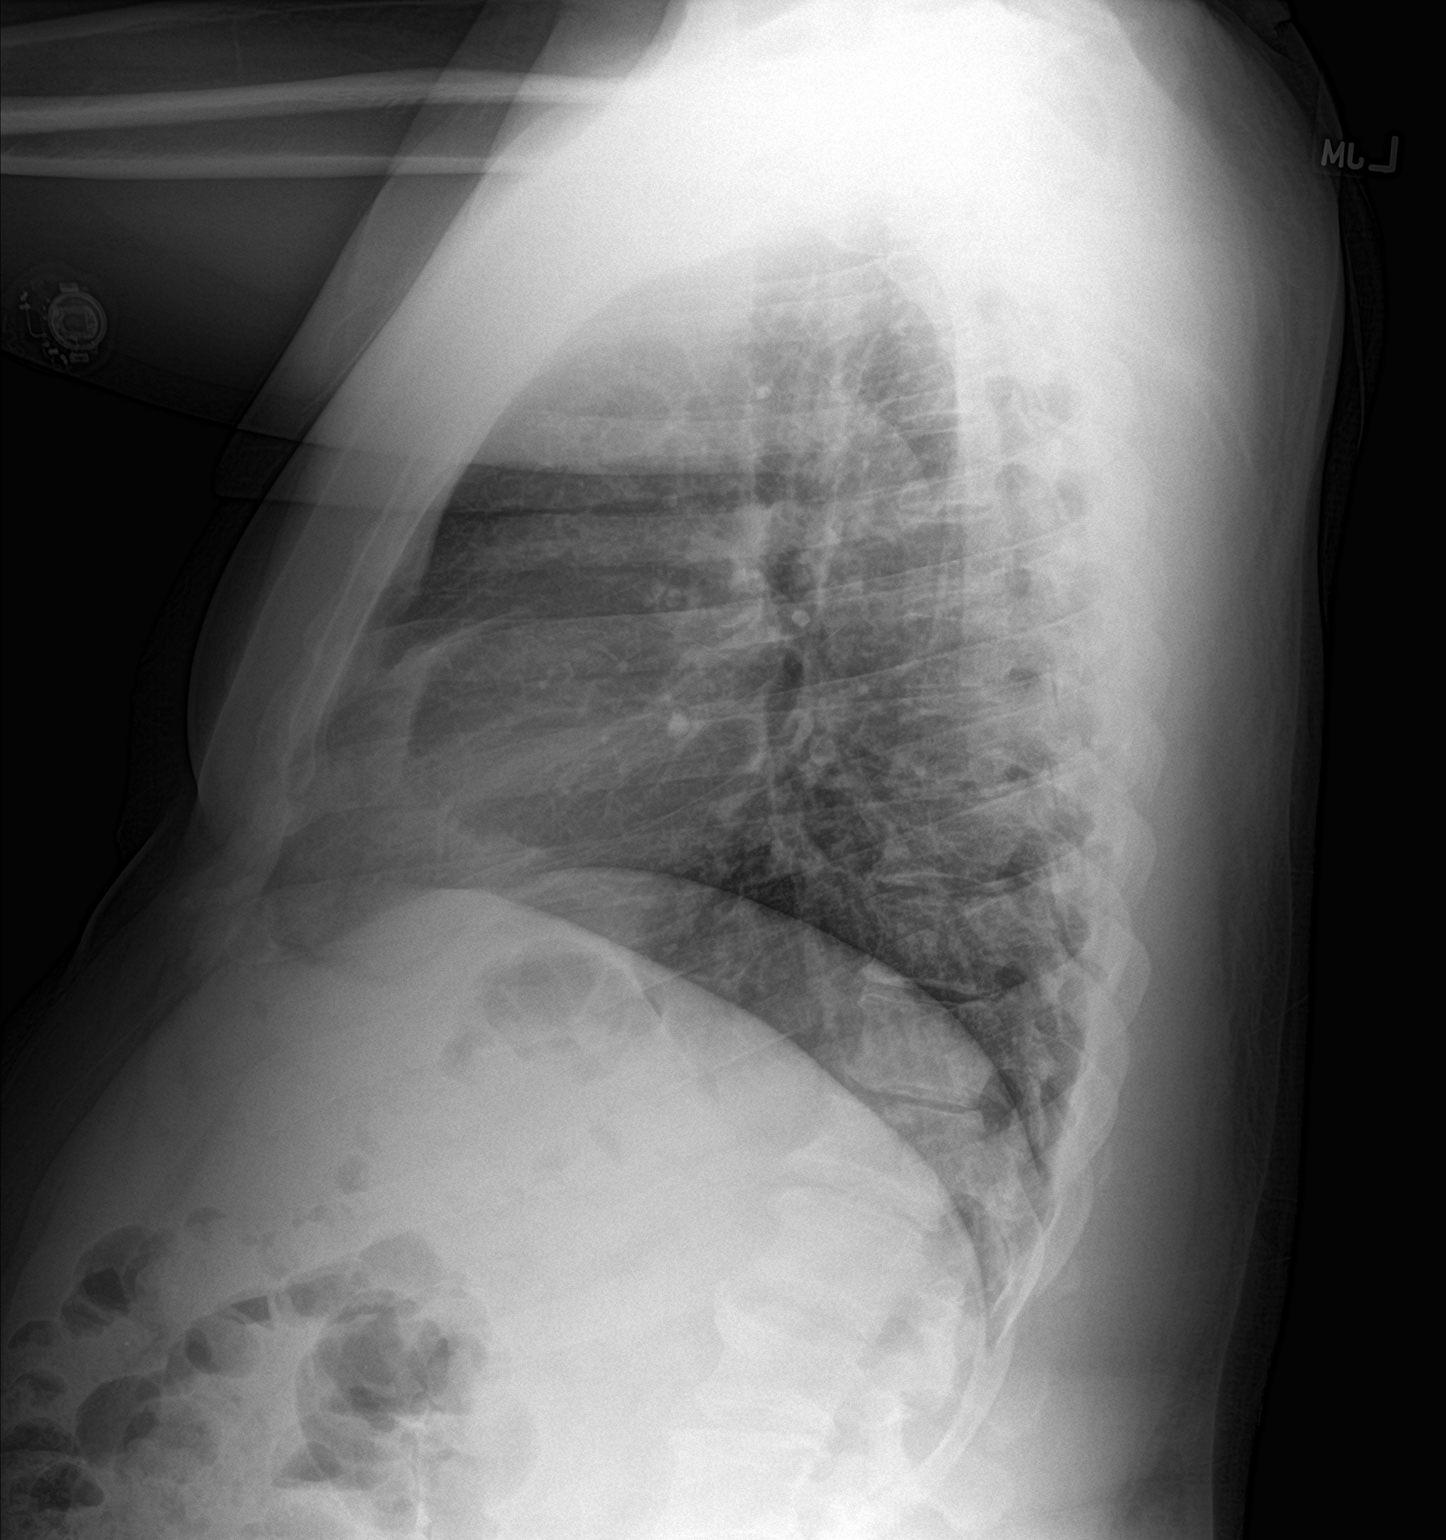

[2 of 2 positions shown; findings below may reference images not displayed]

FINDINGS: The cardiomediastinal silhouette is normal in contour. No pleural
effusion. No pneumothorax. No acute pleuroparenchymal abnormality.
Visualized abdomen is unremarkable. Unchanged mild wedging of a
vertebral body at the thoracolumbar junction.
IMPRESSION: No acute cardiopulmonary abnormality.

## 2022-11-08 ENCOUNTER — Encounter (HOSPITAL_COMMUNITY): Payer: Self-pay

## 2022-11-08 ENCOUNTER — Other Ambulatory Visit: Payer: Self-pay

## 2022-11-08 ENCOUNTER — Emergency Department (HOSPITAL_COMMUNITY)
Admission: EM | Admit: 2022-11-08 | Discharge: 2022-11-09 | Disposition: A | Discharge status: Home / Self Care | Payer: BLUE CROSS/BLUE SHIELD | Attending: Emergency Medicine | Admitting: Emergency Medicine

## 2022-11-08 ENCOUNTER — Emergency Department (HOSPITAL_COMMUNITY): Payer: BLUE CROSS/BLUE SHIELD

## 2022-11-08 DIAGNOSIS — E1165 Type 2 diabetes mellitus with hyperglycemia: Secondary | ICD-10-CM | POA: Diagnosis not present

## 2022-11-08 DIAGNOSIS — R1030 Lower abdominal pain, unspecified: Secondary | ICD-10-CM | POA: Diagnosis present

## 2022-11-08 DIAGNOSIS — Z794 Long term (current) use of insulin: Secondary | ICD-10-CM | POA: Diagnosis not present

## 2022-11-08 DIAGNOSIS — R739 Hyperglycemia, unspecified: Secondary | ICD-10-CM

## 2022-11-08 DIAGNOSIS — L02214 Cutaneous abscess of groin: Secondary | ICD-10-CM | POA: Diagnosis not present

## 2022-11-08 LAB — CBC WITH DIFFERENTIAL/PLATELET
Abs Immature Granulocytes: 0.01 10*3/uL (ref 0.00–0.07)
Basophils Absolute: 0 10*3/uL (ref 0.0–0.1)
Basophils Relative: 0 %
Eosinophils Absolute: 0.1 10*3/uL (ref 0.0–0.5)
Eosinophils Relative: 2 %
HCT: 46 % (ref 39.0–52.0)
Hemoglobin: 16.1 g/dL (ref 13.0–17.0)
Immature Granulocytes: 0 %
Lymphocytes Relative: 22 %
Lymphs Abs: 1.5 10*3/uL (ref 0.7–4.0)
MCH: 31.4 pg (ref 26.0–34.0)
MCHC: 35 g/dL (ref 30.0–36.0)
MCV: 89.8 fL (ref 80.0–100.0)
Monocytes Absolute: 0.3 10*3/uL (ref 0.1–1.0)
Monocytes Relative: 5 %
Neutro Abs: 4.8 10*3/uL (ref 1.7–7.7)
Neutrophils Relative %: 71 %
Platelets: 192 10*3/uL (ref 150–400)
RBC: 5.12 MIL/uL (ref 4.22–5.81)
RDW: 11.8 % (ref 11.5–15.5)
WBC: 6.7 10*3/uL (ref 4.0–10.5)
nRBC: 0 % (ref 0.0–0.2)

## 2022-11-08 LAB — COMPREHENSIVE METABOLIC PANEL
ALT: 23 U/L (ref 0–44)
AST: 21 U/L (ref 15–41)
Albumin: 3.8 g/dL (ref 3.5–5.0)
Alkaline Phosphatase: 93 U/L (ref 38–126)
Anion gap: 9 (ref 5–15)
BUN: 9 mg/dL (ref 6–20)
CO2: 23 mmol/L (ref 22–32)
Calcium: 9.3 mg/dL (ref 8.9–10.3)
Chloride: 98 mmol/L (ref 98–111)
Creatinine, Ser: 0.93 mg/dL (ref 0.61–1.24)
GFR, Estimated: >60 mL/min (ref >60)
Glucose, Bld: 470 mg/dL — ABNORMAL HIGH (ref 70–99)
Potassium: 3.8 mmol/L (ref 3.5–5.1)
Sodium: 130 mmol/L — ABNORMAL LOW (ref 135–145)
Total Bilirubin: 0.7 mg/dL (ref 0.3–1.2)
Total Protein: 7.6 g/dL (ref 6.5–8.1)

## 2022-11-08 LAB — URINALYSIS, ROUTINE W REFLEX MICROSCOPIC
Bacteria, UA: NONE SEEN
Bilirubin Urine: NEGATIVE
Glucose, UA: >=500 mg/dL — AB
Hgb urine dipstick: NEGATIVE
Ketones, ur: 5 mg/dL — AB
Leukocytes,Ua: NEGATIVE
Nitrite: NEGATIVE
Protein, ur: NEGATIVE mg/dL
Specific Gravity, Urine: 1.039 — ABNORMAL HIGH (ref 1.005–1.030)
pH: 6 (ref 5.0–8.0)

## 2022-11-08 LAB — CBG MONITORING, ED
Glucose-Capillary: 412 mg/dL — ABNORMAL HIGH (ref 70–99)
Glucose-Capillary: 226 mg/dL — ABNORMAL HIGH (ref 70–99)
Glucose-Capillary: 186 mg/dL — ABNORMAL HIGH (ref 70–99)

## 2022-11-08 LAB — BETA-HYDROXYBUTYRIC ACID: Beta-Hydroxybutyric Acid: 0.12 mmol/L (ref 0.05–0.27)

## 2022-11-08 MED ORDER — SULFAMETHOXAZOLE-TRIMETHOPRIM 800-160 MG PO TABS: 1.0000 | ORAL_TABLET | Freq: Two times a day (BID) | ORAL | 0 refills | Status: AC

## 2022-11-08 MED ORDER — LIDOCAINE-EPINEPHRINE (PF) 2 %-1:200000 IJ SOLN
10.0000 mL | Freq: Once | INTRAMUSCULAR | Status: AC
  Administered 2022-11-08: 10 mL
  Filled 2022-11-08: qty 20

## 2022-11-08 MED ORDER — IOHEXOL 350 MG/ML SOLN
75.0000 mL | Freq: Once | INTRAVENOUS | Status: AC | PRN
  Administered 2022-11-08: 75 mL via INTRAVENOUS

## 2022-11-08 MED ORDER — CEPHALEXIN 500 MG PO CAPS: 500.0000 mg | ORAL_CAPSULE | Freq: Four times a day (QID) | ORAL | 0 refills | Status: DC

## 2022-11-08 MED ORDER — LACTATED RINGERS IV BOLUS
2000.0000 mL | Freq: Once | INTRAVENOUS | Status: AC
  Administered 2022-11-08: 2000 mL via INTRAVENOUS

## 2022-11-08 MED ORDER — SULFAMETHOXAZOLE-TRIMETHOPRIM 800-160 MG PO TABS
1.0000 | ORAL_TABLET | Freq: Once | ORAL | Status: AC
  Administered 2022-11-08: 1 via ORAL
  Filled 2022-11-08: qty 1

## 2022-11-08 MED ORDER — CEPHALEXIN 250 MG PO CAPS
500.0000 mg | ORAL_CAPSULE | Freq: Once | ORAL | Status: AC
  Administered 2022-11-08: 500 mg via ORAL
  Filled 2022-11-08: qty 2

## 2022-11-08 MED ORDER — SEMGLEE (YFGN) 100 UNIT/ML ~~LOC~~ SOPN
20.0000 [IU] | PEN_INJECTOR | Freq: Every day | SUBCUTANEOUS | 0 refills | Status: DC
Start: 2022-11-08 — End: 2023-06-06

## 2022-11-08 NOTE — ED Notes (Signed)
Pt is a&ox4, warm and dry to touch. Pt complains of blood sugar being high. Pt denies any cp/shob. Pt is already in a gown, attached to monitor/vitals. Side rails up x 2, call light within reach.

## 2022-11-08 NOTE — Discharge Instructions (Signed)
Please read and follow all provided instructions.  Your diagnoses today include:  1. Abscess of right groin   2. Hyperglycemia     Tests performed today include: Vital signs. See below for your results today.   Medications prescribed:  Keflex (cephalexin) - antibiotic  You have been prescribed an antibiotic medicine: take the entire course of medicine even if you are feeling better. Stopping early can cause the antibiotic not to work.  Bactrim (trimethoprim/sulfamethoxazole) - antibiotic  You have been prescribed an antibiotic medicine: take the entire course of medicine even if you are feeling better. Stopping early can cause the antibiotic not to work.  Take any prescribed medications only as directed.   Home care instructions:  Follow any educational materials contained in this packet  Follow-up instructions: See your doctor or return in 48 hours for recheck of symptoms.  Return instructions:  Return to the Emergency Department if you have: Fever Worsening symptoms Worsening pain Worsening swelling Redness of the skin that moves away from the affected area, especially if it streaks away from the affected area  Any other emergent concerns  Your vital signs today were: BP (!) 147/90 (BP Location: Right Arm)   Pulse 89   Temp 97.9 F (36.6 C) (Oral)   Resp 14   Ht 5\' 6"  (1.676 m)   Wt 98.9 kg   SpO2 100%   BMI 35.19 kg/m  If your blood pressure (BP) was elevated above 135/85 this visit, please have this repeated by your doctor within one month. --------------

## 2022-11-08 NOTE — ED Provider Triage Note (Signed)
Emergency Medicine Provider Triage Evaluation Note  Logan Weiss , a 52 y.o. male  was evaluated in triage.  Pt complains of hyperglycemia.  Patient reports has been out of his diabetic medications now for several months.  Previous history of abscesses in the groin typically complicated due to poor blood sugar control.  Patient does report that he has a small abscess forming in his groin at this time.  Denies any drainage from this area.  Review of Systems  Positive: As above Negative: As above  Physical Exam  BP (!) 123/90 (BP Location: Right Arm)   Pulse (!) 104   Temp 98.6 F (37 C) (Oral)   Resp 18   Ht 5\' 6"  (1.676 m)   Wt 98.9 kg   SpO2 99%   BMI 35.19 kg/m  Gen:   Awake, no distress   Resp:  Normal effort MSK:   Moves extremities without difficulty  Other:    Medical Decision Making  Medically screening exam initiated at 3:00 PM.  Appropriate orders placed.  Logan Weiss was informed that the remainder of the evaluation will be completed by another provider, this initial triage assessment does not replace that evaluation, and the importance of remaining in the ED until their evaluation is complete.     Smitty Knudsen, PA-C 11/08/22 1501

## 2022-11-08 NOTE — Discharge Planning (Signed)
Transition of Care [facility]) - Emergency Department Mini Assessment   Patient Details  Name: Logan Weiss MRN: [id number] Date of Birth: 03-21-1971  [facility] of [facility]) CM/SW Contact:    Lavenia Atlas, RN Phone Number: 11/08/2022, 10:18 PM   Clinical Narrative:  Received TOC consult for medication assistance. Per chart review patient is not eligible for MATCH due to having medical insurance. This RNCM attempted to speak with patient however unable to get an answer on cell phone or room phone. During attempt to reach patient, he was  transferred from room 30 to 46. Notified RN, EDP.  -10:20pm This RNCM spoke with patient who reports his insurance will not pay for his medications of $932 Ozempic and Semglee. Patient is unaware as to why his insurance will not pay. This RNCM encouraged patient to call his insurance to get alternative or clear understanding as to why insurance will not pay. Patient reports he attempted to make any appointment with endocrinologist who has not called him back, resulting in coming to [facility] ED. Due to being after hours this RNCM unable to assist further. Will request daytime RNCM to follow up with patient tomorrow.     TOC will follow post discharge. ED Mini Assessment: What brought you to the Emergency Department? : abscess on right groin and hyperglycemia  Barriers to Discharge: Continued Medical Work up, ED Medication assistance  Barrier interventions: attempt to coordinate medication assistance  Means of departure: Car  Interventions which prevented an admission or readmission: Medication Review    Patient Contact and Communications        ,            CMS Medicare.gov Compare Post Acute Care list provided to:: Patient Choice offered to / list presented to : Patient  Admission diagnosis:  Hyperglycemia Patient Active Problem List   Diagnosis Date Noted   Fever 10/11/2019   Elevated liver enzymes 10/11/2019   Sepsis (HCC) 10/10/2019    Hyperglycemia    Normocytic anemia 09/30/2019   Necrotizing soft tissue infection 09/30/2019   Abscess of multiple sites of perineum 09/27/2019   Type 2 diabetes mellitus with hyperglycemia (HCC) 11/05/2017   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 ([address]), [place] [address] [place] ([place]) [place] [address] Phone: [phone] Fax: [phone]

## 2022-11-08 NOTE — ED Triage Notes (Signed)
Reports has been out of his diabetes medication for a few months and doesn't have an appt with endocrinologist for awhile and his cbg has been running in 300's and now has developed in right groin.

## 2022-11-08 NOTE — ED Provider Notes (Addendum)
[place] EMERGENCY DEPARTMENT AT [facility] Provider Note   CSN: [id number] Arrival date & time: 11/08/22  1402     History  Chief Complaint  Patient presents with   Hyperglycemia    Logan Weiss is a 52 y.o. male.  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  Patient denies associated fevers, nausea or vomiting.  No chest pain, shortness of breath or cough.  No abdominal pain or diarrhea.  No drainage from the area in the groin.  He states that he wanted to be seen today because he did not want the infection to progress like it had in the past.  Patient has been out of Ozempic for several months.  I can see that he contacted his doctor today for refills.  He is nearing the end of his other insulin medication.       Home Medications Prior to Admission medications   Medication Sig Start Date End Date Taking? Authorizing Provider  acetaminophen (TYLENOL) 500 MG tablet Take 1,000 mg by mouth every 6 (six) hours as needed for moderate pain or headache.    [provider]  atorvastatin (LIPITOR) 40 MG tablet Take 40 mg by mouth every evening. 11/17/21   [provider]  GlucoCom Lancets MISC Check blood sugar TID & QHS 06/22/14   Advani, Ayesha Rumpf, MD  glucose blood (CHOICE DM FORA G20 TEST STRIPS) test strip Use as instructed 06/22/14   Doris Cheadle, MD  ibuprofen (ADVIL) 200 MG tablet Take 400 mg by mouth every 6 (six) hours as needed for headache or moderate pain.    [provider]  insulin aspart (NOVOLOG) 100 UNIT/ML FlexPen Inject 12 Units into the skin 3 (three) times daily with meals. Patient not taking: Reported on 11/20/2021 10/04/19   Guinevere Scarlet A, DO  insulin glargine (LANTUS) 100 UNIT/ML Solostar Pen Inject 35 Units  into the skin at bedtime. Patient not taking: Reported on 11/20/2021 10/04/19   Guinevere Scarlet A, DO  Insulin Pen Needle 32G X 4 MM MISC use to inject insulin 4 times a day 10/23/19   Seawell, Jaimie A, DO  Multiple Vitamins-Minerals (MULTI ADULT GUMMIES PO) Take 1 tablet by mouth daily.    [provider]  pantoprazole (PROTONIX) 20 MG tablet Take 1 tablet (20 mg total) by mouth daily. 11/20/21   Cardama, Amadeo Garnet, MD  SEMGLEE, YFGN, 100 UNIT/ML Pen Inject 20 Units into the skin at bedtime. 11/03/21   [provider]      Allergies    Metformin and related and Doxycycline    Review of Systems   Review of Systems  Physical Exam Updated Vital Signs BP (!) 123/90 (BP Location: Right Arm)   Pulse (!) 104   Temp 98.6 F (37 C) (Oral)   Resp 18   Ht 5\' 6"  (1.676 m)   Wt 98.9 kg   SpO2 99%   BMI 35.19 kg/m   Physical Exam Vitals and nursing note reviewed.  Constitutional:      General: He is not in acute distress.    Appearance: He is well-developed.  HENT:     Head: Normocephalic and atraumatic.  Eyes:     General:        Right eye: No discharge.        Left  eye: No discharge.     Conjunctiva/sclera: Conjunctivae normal.  Cardiovascular:     Rate and Rhythm: Normal rate and regular rhythm.     Heart sounds: Normal heart sounds.  Pulmonary:     Effort: Pulmonary effort is normal.     Breath sounds: Normal breath sounds.  Abdominal:     Palpations: Abdomen is soft.     Tenderness: There is no abdominal tenderness.  Musculoskeletal:     Cervical back: Normal range of motion and neck supple.  Skin:    General: Skin is warm and dry.     Comments: Patient with a 2 cm area of swelling induration to the right groin crease with mild tenderness.  No active drainage.  No significant cellulitis.  No crepitus.  Neurological:     Mental Status: He is alert.     ED Results / Procedures / Treatments   Labs (all labs ordered are listed, but only abnormal  results are displayed) Labs Reviewed  COMPREHENSIVE METABOLIC PANEL - Abnormal; Notable for the following components:      Result Value   Sodium 130 (*)    Glucose, Bld 470 (*)    All other components within normal limits  URINALYSIS, ROUTINE W REFLEX MICROSCOPIC - Abnormal; Notable for the following components:   Specific Gravity, Urine 1.039 (*)    Glucose, UA >=500 (*)    Ketones, ur 5 (*)    All other components within normal limits  CBG MONITORING, ED - Abnormal; Notable for the following components:   Glucose-Capillary 412 (*)    All other components within normal limits  CBC WITH DIFFERENTIAL/PLATELET  BETA-HYDROXYBUTYRIC ACID    EKG None  Radiology CT PELVIS W CONTRAST  Result Date: 11/08/2022 CLINICAL DATA:  Suspect right groin abscess. History of necrotizing infection. EXAM: CT PELVIS WITH CONTRAST TECHNIQUE: Multidetector CT imaging of the pelvis was performed using the standard protocol following the bolus administration of intravenous contrast. RADIATION DOSE REDUCTION: This exam was performed according to the departmental dose-optimization program which includes automated exposure control, adjustment of the mA and/or kV according to patient size and/or use of iterative reconstruction technique. CONTRAST:  75mL OMNIPAQUE IOHEXOL 350 MG/ML SOLN COMPARISON:  CT abdomen and pelvis 10/10/2019 FINDINGS: Urinary Tract: Subcentimeter cortical cysts are identified in the lower pole of the right kidney. The visualized ureters and bladder are within normal limits. Bowel: Unremarkable visualized pelvic bowel loops. No perianal inflammation, abscess or fistula identified. Vascular/Lymphatic: No enlarged pelvic lymph nodes or acute vascular abnormality. Reproductive:  The prostate gland is enlarged. Other: There is no ascites or focal abdominal wall hernia. There are prominent right inguinal lymph nodes measuring up to 9 mm which are similar to the prior study. There is skin thickening and  inflammation in the inferior right inguinal region which has mildly decreased compared to prior. There is a nonenhancing low-density collection just beneath the skin surface in the low right inguinal region medially measuring 1.9 x 0.9 x 1.8 cm image 6/146. No enhancing fluid collections or soft tissue gas. Musculoskeletal: Degenerative changes affect the spine. IMPRESSION: 1. Right inguinal cellulitis has mildly decreased compared to prior. There is a nonenhancing low-density collection just beneath the skin surface in the low right inguinal region measuring 1.9 x 0.9 x 1.8 cm, nonspecific. Findings may be related to phlegmon or focal edema. No enhancing fluid collections or soft tissue gas. 2. Stable prominent right inguinal lymph nodes, likely reactive. 3. No perianal inflammation, abscess or fistula identified. 4.  Prostatomegaly. 5. Right renal cortical cysts.  No follow-up necessary. Electronically Signed   By: Darliss Cheney M.D.   On: 11/08/2022 18:28    Procedures .Marland KitchenIncision and Drainage  Date/Time: 11/08/2022 7:34 PM  Performed by: Renne Crigler, PA-C Authorized by: Renne Crigler, PA-C   Consent:    Consent obtained:  Verbal   Consent given by:  Patient   Risks discussed:  Bleeding, damage to other organs, infection, pain and incomplete drainage Universal protocol:    Patient identity confirmed:  Verbally with patient and provided demographic data Location:    Type:  Abscess   Size:  2cm Pre-procedure details:    Skin preparation:  Povidone-iodine Anesthesia:    Anesthesia method:  Local infiltration   Local anesthetic:  Lidocaine 2% WITH epi Procedure type:    Complexity:  Complex Procedure details:    Incision types:  Stab incision   Incision depth:  Subcutaneous   Wound management:  Probed and deloculated   Drainage:  Purulent   Drainage amount:  Moderate   Wound treatment:  Wound left open   Packing materials:  None Post-procedure details:    Procedure completion:   Tolerated well, no immediate complications     Medications Ordered in ED Medications  lactated ringers bolus 2,000 mL (has no administration in time range)    ED Course/ Medical Decision Making/ A&P    Patient seen and examined. History obtained directly from patient. Work-up including labs, imaging, EKG ordered in triage, if performed, were reviewed.    Labs/EKG: Independently reviewed and interpreted.  This included: CBC with diff normal; CMP sodium slightly low at 130, glucose 470, normal bicarb and anion gap; betahydroxybuterate is normal; UA without compelling signs of infection, concentrated, glucose 500, ketones minimally elevated at 5.   Imaging: Independently visualized and interpreted.  This included: None ordered.  I added CT pelvis with contrast to evaluate extent of infection given his history of necrotizing infection.  Medications/Fluids: Ordered: LR 2L  Most recent vital signs reviewed and are as follows: BP (!) 123/90 (BP Location: Right Arm)   Pulse (!) 104   Temp 98.6 F (37 C) (Oral)   Resp 18   Ht 5\' 6"  (1.676 m)   Wt 98.9 kg   SpO2 99%   BMI 35.19 kg/m   Initial impression: Groin abscess, hyperglycemia  9:34 PM Reassessment performed. Patient appears stable.  I&D performed without complications.  Labs personally reviewed and interpreted including: Glucose down to 226 with IV fluids.  Imaging personally visualized and interpreted including: CT pelvis with contrast shows a small fluid collection, appearance not consistent with extensive cellulitis or gas-forming cellulitis similar to what he has had in the past.  Reviewed pertinent lab work and imaging with patient at bedside. Questions answered.   Most current vital signs reviewed and are as follows: BP (!) 147/90 (BP Location: Right Arm)   Pulse 89   Temp 97.9 F (36.6 C) (Oral)   Resp 14   Ht 5\' 6"  (1.676 m)   Wt 98.9 kg   SpO2 100%   BMI 35.19 kg/m   Plan: Discharge to home.    Prescriptions written for: Keflex, Flagyl.  Patient also provided with a prescription for diabetic medications.  Other home care instructions discussed: Warm soaks, antibiotics, close monitoring.  The patient was urged to return to the Emergency Department urgently with worsening pain, swelling, expanding erythema especially if it streaks away from the affected area, fever, or if they have any other concerns.  The patient was urged to return to the Emergency Department or go to their PCP in 48 hours for wound recheck if the area is not significantly improved.  The patient verbalized understanding and stated agreement with this plan.   10:46 PM Patient placed in contact with TOC prior to d/c due to his request to help get medications for his diabetes. He is concerned that he will have to come back without help obtaining medications.                            Medical Decision Making Amount and/or Complexity of Data Reviewed Radiology: ordered.  Risk Prescription drug management.   Patient here with right groin abscess as well as hyperglycemia.  He has been off of one of his diabetic medications recently.  I can see that he has contacted his endocrinologist office.  No evident of DKA today.  Patient does have a history of gas-forming infection in the groin that required surgical debridement.  CT today shows an isolated abscess which was drained at bedside.  No concerning features of the infection noted on CT today.  Low concern for necrotizing fasciitis or Fournier's gangrene at this time.  Also no fevers, white blood cell count normal.  Patient clinically improved with IV fluids his blood sugars down into the low 200s at this time.  The patient's vital signs, pertinent lab work and imaging were reviewed and interpreted as discussed in the ED course. Hospitalization was considered for further testing, treatments, or serial exams/observation. However as patient is well-appearing, has a  stable exam, and reassuring studies today, I do not feel that they warrant admission at this time. This plan was discussed with the patient who verbalizes agreement and comfort with this plan and seems reliable and able to return to the Emergency Department with worsening or changing symptoms.    Final Clinical Impression(s) / ED Diagnoses Final diagnoses:  Abscess of right groin  Hyperglycemia    Rx / DC Orders ED Discharge Orders          Ordered    cephALEXin (KEFLEX) 500 MG capsule  4 times daily        11/08/22 2124    sulfamethoxazole-trimethoprim (BACTRIM DS) 800-160 MG tablet  2 times daily        11/08/22 2124    SEMGLEE, YFGN, 100 UNIT/ML Pen  Daily at bedtime        11/08/22 2124              Renne Crigler, PA-C 11/08/22 2136    Melene Plan, DO 11/08/22 2143    Renne Crigler, PA-C 11/08/22 2247    Melene Plan, DO 11/08/22 2309

## 2022-11-14 ENCOUNTER — Telehealth: Payer: Self-pay

## 2022-11-14 NOTE — Telephone Encounter (Signed)
This RNCM received voicemail message from patient requesting a call back.   This RNCM called patient back and left a voicemail. Plan while patient was in the [facility] ED was him to follow up with his insurance company re: affording his DM medications. Patient has medical insurance and TOC, patient not eligible for [facility] program.

## 2023-06-06 ENCOUNTER — Emergency Department (HOSPITAL_COMMUNITY): Payer: BLUE CROSS/BLUE SHIELD

## 2023-06-06 ENCOUNTER — Encounter (HOSPITAL_COMMUNITY): Payer: Self-pay | Admitting: Emergency Medicine

## 2023-06-06 ENCOUNTER — Emergency Department (HOSPITAL_COMMUNITY)
Admission: EM | Admit: 2023-06-06 | Discharge: 2023-06-06 | Disposition: A | Discharge status: Home / Self Care | Payer: BLUE CROSS/BLUE SHIELD | Attending: Emergency Medicine | Admitting: Emergency Medicine

## 2023-06-06 DIAGNOSIS — Z794 Long term (current) use of insulin: Secondary | ICD-10-CM | POA: Insufficient documentation

## 2023-06-06 DIAGNOSIS — W010XXA Fall on same level from slipping, tripping and stumbling without subsequent striking against object, initial encounter: Secondary | ICD-10-CM | POA: Diagnosis not present

## 2023-06-06 DIAGNOSIS — S83412A Sprain of medial collateral ligament of left knee, initial encounter: Secondary | ICD-10-CM | POA: Diagnosis not present

## 2023-06-06 DIAGNOSIS — M25562 Pain in left knee: Secondary | ICD-10-CM | POA: Diagnosis present

## 2023-06-06 NOTE — ED Triage Notes (Signed)
Pt here from home with c/o left knee pain after a slip and fall , knee does have some swelling to the inside of the knee

## 2023-06-06 NOTE — ED Notes (Signed)
Patient transported to X-ray 

## 2023-06-06 NOTE — ED Provider Notes (Signed)
[place] EMERGENCY DEPARTMENT AT [facility] Provider Note   CSN: [id number] Arrival date & time: 06/06/23  4098     History  Chief Complaint  Patient presents with   Knee Pain    Logan Weiss is a 52 y.o. male.   Knee Pain Patient's fell last night.  Left knee pain.  States he iced it but continues to have pain.  No other injury.     Home Medications Prior to Admission medications   Medication Sig Start Date End Date Taking? Authorizing Provider  acetaminophen (TYLENOL) 500 MG tablet Take 1,500 mg by mouth 2 (two) times daily as needed for moderate pain (pain score 4-6) or headache.   Yes [provider]  ibuprofen (ADVIL) 200 MG tablet Take 400 mg by mouth 2 (two) times daily as needed for headache or moderate pain (pain score 4-6).   Yes [provider]  insulin lispro (HUMALOG) 100 UNIT/ML KwikPen Inject 8 Units into the skin with breakfast, with lunch, and with evening meal.   Yes [provider]  Multiple Vitamins-Minerals (MULTIVITAMIN MEN) TABS Take 1 tablet by mouth daily.   Yes [provider]  GlucoCom Lancets MISC Check blood sugar TID & QHS 06/22/14   Advani, Ayesha Rumpf, MD  glucose blood (CHOICE DM FORA G20 TEST STRIPS) test strip Use as instructed 06/22/14   Doris Cheadle, MD  Insulin Pen Needle 32G X 4 MM MISC use to inject insulin 4 times a day 10/23/19   Seawell, Jaimie A, DO      Allergies    Metformin and related and Vibramycin [doxycycline]    Review of Systems   Review of Systems  Physical Exam Updated Vital Signs BP (!) 151/91 (BP Location: Right Arm)   Pulse 77   Temp 98.3 F (36.8 C) (Oral)   Resp 20   SpO2 96%  Physical Exam Vitals and nursing note reviewed.  Abdominal:     Tenderness: There is no abdominal tenderness.  Musculoskeletal:        General: Tenderness present.     Comments: Tenderness left knee medially.  Somewhat decreased flexion at knee.  Does have pain with valgus strain.  No  real lateral pain.  Mild anterior pain.  Neurovascularly intact distally.  No hip or ankle tenderness.  Neurological:     Mental Status: He is alert.     ED Results / Procedures / Treatments   Labs (all labs ordered are listed, but only abnormal results are displayed) Labs Reviewed - No data to display  EKG None  Radiology DG Knee Complete 4 Views Left  Result Date: 06/06/2023 CLINICAL DATA:  Fall, left knee pain EXAM: LEFT KNEE - COMPLETE 4+ VIEW COMPARISON:  None Available. FINDINGS: No evidence of fracture or dislocation. Small knee joint effusion. Moderate medial compartment joint space narrowing. Prominent ossification arising superiorly from the tibial tuberosity, likely sequela of Osgood-Schlatter disease. Soft tissues are unremarkable. IMPRESSION: 1. No acute fracture or dislocation of the left knee. 2. Small knee joint effusion. 3. Moderate medial compartment joint space narrowing. Electronically Signed   By: Duanne Guess D.O.   On: 06/06/2023 10:46    Procedures Procedures    Medications Ordered in ED Medications - No data to display  ED Course/ Medical Decision Making/ A&P                                 Medical Decision  Making Amount and/or Complexity of Data Reviewed Radiology: ordered.   Patient was left knee pain after fall.  Tenderness medially.  Differential diagnosis includes MCL sprain and fracture such as tibial plateau fracture.  Will get x-ray.  X-ray shows no fracture.  Small effusion.  Immobilizer given.  Outpatient follow-up as needed.        Final Clinical Impression(s) / ED Diagnoses Final diagnoses:  Sprain of medial collateral ligament of left knee, initial encounter    Rx / DC Orders ED Discharge Orders     None         Benjiman Core, MD 06/07/23 1429

## 2023-06-06 NOTE — Progress Notes (Signed)
Orthopedic Tech Progress Note Patient Details:  Logan Weiss 04-20-1971 [id number]  Ortho Devices Type of Ortho Device: Knee Immobilizer, Crutches Ortho Device/Splint Location: LLE Ortho Device/Splint Interventions: Application, Adjustment, Ordered   Post Interventions Patient Tolerated: Well Instructions Provided: Adjustment of device  Doyel Mulkern A [name] 06/06/2023, 12:21 PM

## 2023-06-08 ENCOUNTER — Telehealth: Payer: Self-pay

## 2023-06-08 NOTE — Telephone Encounter (Signed)
Patient called to state that he was seen in the ED but still having pain. Wondered if something more could be prescribed. Messaged with Provider of record, no message returned today. Called patient back this afternoon to follow up with PCP on Monday or if he feels he needs to be seen again to come back if needed.

## 2023-06-18 ENCOUNTER — Other Ambulatory Visit: Payer: Self-pay

## 2023-06-18 ENCOUNTER — Emergency Department (HOSPITAL_COMMUNITY)
Admission: EM | Admit: 2023-06-18 | Discharge: 2023-06-18 | Disposition: A | Discharge status: Home / Self Care | Payer: BLUE CROSS/BLUE SHIELD

## 2023-06-18 DIAGNOSIS — X58XXXA Exposure to other specified factors, initial encounter: Secondary | ICD-10-CM | POA: Diagnosis not present

## 2023-06-18 DIAGNOSIS — Z794 Long term (current) use of insulin: Secondary | ICD-10-CM | POA: Diagnosis not present

## 2023-06-18 DIAGNOSIS — S83412D Sprain of medial collateral ligament of left knee, subsequent encounter: Secondary | ICD-10-CM | POA: Insufficient documentation

## 2023-06-18 DIAGNOSIS — S8992XA Unspecified injury of left lower leg, initial encounter: Secondary | ICD-10-CM | POA: Diagnosis present

## 2023-06-18 MED ORDER — NAPROXEN 500 MG PO TABS: 500.0000 mg | ORAL_TABLET | Freq: Two times a day (BID) | ORAL | 0 refills | Status: AC | PRN

## 2023-06-18 NOTE — Discharge Instructions (Signed)
You have an MCL sprain.  Please follow-up with your orthopedic doctor.  Return if develop fevers, chills, knee becomes swollen, red/hot, severe pain, or you develop any new or worsening symptoms that are concerning to you.  You may continue to take over-the-counter Tylenol alternating with ibuprofen for your pain.  Continue to wear the brace as instructed by your orthopedic doctor.

## 2023-06-18 NOTE — ED Triage Notes (Signed)
Pt. Stated, Ive had left knee pain since Thanksgiving, I fell. It is still is hurting and I went to another Dr. But I need a MRI. Ive had xrays and a knee brace but Im no better.

## 2023-06-18 NOTE — ED Provider Notes (Signed)
[place] EMERGENCY DEPARTMENT AT [facility] Provider Note   CSN: [id number] Arrival date & time: 06/18/23  1036     History  Chief Complaint  Patient presents with   Knee Pain    Logan Weiss is a 52 y.o. male.  This is a 52 year old male presenting emergency department with left knee pain.  Had a fall on Thanksgiving was diagnosed with MCL sprain.  Went to orthopedic doctor was told he has an MCL sprain.  Was taken out of knee immobilizer and put in hinged knee brace.  He reports that he continues to have some sharp pain to his knee.  No fevers no chills no numbness tingling changes in sensation.  No redness or swelling to the knee.  He states that he wants a definitive diagnosis and advanced imaging at this time.   Knee Pain      Home Medications Prior to Admission medications   Medication Sig Start Date End Date Taking? Authorizing Provider  acetaminophen (TYLENOL) 500 MG tablet Take 1,500 mg by mouth 2 (two) times daily as needed for moderate pain (pain score 4-6) or headache.    [provider]  GlucoCom Lancets MISC Check blood sugar TID & QHS 06/22/14   Advani, Ayesha Rumpf, MD  glucose blood (CHOICE DM FORA G20 TEST STRIPS) test strip Use as instructed 06/22/14   Doris Cheadle, MD  ibuprofen (ADVIL) 200 MG tablet Take 400 mg by mouth 2 (two) times daily as needed for headache or moderate pain (pain score 4-6).    [provider]  insulin lispro (HUMALOG) 100 UNIT/ML KwikPen Inject 8 Units into the skin with breakfast, with lunch, and with evening meal.    [provider]  Insulin Pen Needle 32G X 4 MM MISC use to inject insulin 4 times a day 10/23/19   Seawell, Jaimie A, DO  Multiple Vitamins-Minerals (MULTIVITAMIN MEN) TABS Take 1 tablet by mouth daily.    [provider]      Allergies    Metformin and related and Vibramycin [doxycycline]    Review of Systems   Review of Systems  Physical Exam Updated Vital Signs BP  (!) 129/95 (BP Location: Right Arm)   Pulse 89   Temp 98.1 F (36.7 C)   Resp 16   SpO2 98%  Physical Exam Vitals and nursing note reviewed.  HENT:     Head: Normocephalic.     Nose: Nose normal.  Eyes:     Conjunctiva/sclera: Conjunctivae normal.  Cardiovascular:     Rate and Rhythm: Normal rate.  Pulmonary:     Effort: Pulmonary effort is normal.  Musculoskeletal:     Comments: Left knee with no swelling, erythema or warmth.  No significant tenderness to palpation.  Full ROM  Neurological:     Mental Status: He is alert and oriented to person, place, and time.  Psychiatric:        Mood and Affect: Mood normal.        Behavior: Behavior normal.     ED Results / Procedures / Treatments   Labs (all labs ordered are listed, but only abnormal results are displayed) Labs Reviewed - No data to display  EKG None  Radiology No results found.  Procedures Procedures    Medications Ordered in ED Medications - No data to display  ED Course/ Medical Decision Making/ A&P Clinical Course as of 06/18/23 1344  Tue Jun 18, 2023  1325 Saw ortho on 06/15/23 per my chart review.  Per their note: "Assessment: MCL sprain left  Plan: I had a detailed discussion with patient and wife regarding continued treatment of the MCL sprain of the left knee. Today I have recommended patient continue with conservative treatment including rest ice elevation and NSAIDs. Based on subjective history as well as physical exam further recommendation is made for application of hinged knee brace and prescription Motrin 600 mg 1Q6 quantity 30. I have also written a work note for the patient indicating no lifting over 20 pounds and must wear brace at all times. Initially I did recommend hold from lifting all weight as patient works as a Special educational needs teacher however patient reported to me he does not plan to stop working. Therefore I have advised patient to not lift over 20 pounds and to always wear brace at work.  Referral to physical therapy was placed for the patient and he will follow-up as scheduled. Patient will continue this treatment plan and follow-up with me in 4 to 5 weeks for reevaluation. All questions were invited and answered. " [TY]    Clinical Course User Index [TY] Coral Spikes, DO                                 Medical Decision Making Well-appearing 52 year old male presented with left knee pain with diagnosed MCL by orthopedic doctor on 12/6.  He is requesting an MRI for a definitive diagnosis as he is concerned there may be something else going on with his knee.  He has no fever tachycardia and has exam not consistent with septic joint.  He had x-rays done on Thanksgiving which were negative for acute pathology.  He has appropriate follow-up with Ortho.  Discussed that there is no need for emergent MRI at this time.  Patient became agitated.  Recommended to him that he should follow-up with orthopedic doctor for outpatient MRI or his primary doctor and that he should remain in the splint provided to him by the orthopedist.            Final Clinical Impression(s) / ED Diagnoses Final diagnoses:  Sprain of medial collateral ligament of left knee, subsequent encounter    Rx / DC Orders ED Discharge Orders     None         Coral Spikes, DO 06/18/23 1344
